# Patient Record
Sex: Female | Born: 1985 | Race: White | Hispanic: No | Smoking: Former smoker
Health system: Southern US, Community
[De-identification: ages and names within clinical notes are randomized; demographics above are authoritative.]

## PROBLEM LIST (undated history)

## (undated) DIAGNOSIS — O1490 Unspecified pre-eclampsia, unspecified trimester: Secondary | ICD-10-CM

## (undated) DIAGNOSIS — F419 Anxiety disorder, unspecified: Secondary | ICD-10-CM

## (undated) DIAGNOSIS — Z8669 Personal history of other diseases of the nervous system and sense organs: Secondary | ICD-10-CM

## (undated) DIAGNOSIS — Z87442 Personal history of urinary calculi: Secondary | ICD-10-CM

## (undated) DIAGNOSIS — J45909 Unspecified asthma, uncomplicated: Secondary | ICD-10-CM

## (undated) HISTORY — PX: WISDOM TOOTH EXTRACTION: SHX21

## (undated) HISTORY — PX: URETER SURGERY: SHX823

## (undated) HISTORY — PX: CYSTOSCOPY: SUR368

---
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
## 2014-03-16 DIAGNOSIS — O09299 Supervision of pregnancy with other poor reproductive or obstetric history, unspecified trimester: Secondary | ICD-10-CM | POA: Insufficient documentation

## 2017-09-10 DIAGNOSIS — O0943 Supervision of pregnancy with grand multiparity, third trimester: Secondary | ICD-10-CM | POA: Insufficient documentation

## 2020-11-28 ENCOUNTER — Emergency Department (HOSPITAL_COMMUNITY): Payer: Medicaid Other

## 2020-11-28 ENCOUNTER — Encounter (HOSPITAL_COMMUNITY): Payer: Self-pay | Admitting: Emergency Medicine

## 2020-11-28 ENCOUNTER — Observation Stay (HOSPITAL_COMMUNITY)
Admission: EM | Admit: 2020-11-28 | Discharge: 2020-11-29 | Disposition: A | Discharge status: Home / Self Care | Payer: Medicaid Other | Attending: Urology | Admitting: Urology

## 2020-11-28 DIAGNOSIS — R319 Hematuria, unspecified: Secondary | ICD-10-CM

## 2020-11-28 DIAGNOSIS — N132 Hydronephrosis with renal and ureteral calculous obstruction: Secondary | ICD-10-CM | POA: Diagnosis not present

## 2020-11-28 DIAGNOSIS — Z20822 Contact with and (suspected) exposure to covid-19: Secondary | ICD-10-CM | POA: Diagnosis not present

## 2020-11-28 DIAGNOSIS — N200 Calculus of kidney: Secondary | ICD-10-CM

## 2020-11-28 DIAGNOSIS — N201 Calculus of ureter: Secondary | ICD-10-CM | POA: Diagnosis present

## 2020-11-28 DIAGNOSIS — Z419 Encounter for procedure for purposes other than remedying health state, unspecified: Secondary | ICD-10-CM

## 2020-11-28 DIAGNOSIS — N390 Urinary tract infection, site not specified: Secondary | ICD-10-CM | POA: Insufficient documentation

## 2020-11-28 DIAGNOSIS — R109 Unspecified abdominal pain: Secondary | ICD-10-CM | POA: Diagnosis present

## 2020-11-28 LAB — COMPREHENSIVE METABOLIC PANEL
ALT: 16 U/L (ref 0–44)
AST: 21 U/L (ref 15–41)
Albumin: 4.1 g/dL (ref 3.5–5.0)
Alkaline Phosphatase: 59 U/L (ref 38–126)
Anion gap: 7 (ref 5–15)
BUN: 9 mg/dL (ref 6–20)
CO2: 22 mmol/L (ref 22–32)
Calcium: 9.1 mg/dL (ref 8.9–10.3)
Chloride: 107 mmol/L (ref 98–111)
Creatinine, Ser: 0.97 mg/dL (ref 0.44–1.00)
GFR, Estimated: >60 mL/min (ref >60)
Glucose, Bld: 124 mg/dL — ABNORMAL HIGH (ref 70–99)
Potassium: 3.5 mmol/L (ref 3.5–5.1)
Sodium: 136 mmol/L (ref 135–145)
Total Bilirubin: 0.7 mg/dL (ref 0.3–1.2)
Total Protein: 6.8 g/dL (ref 6.5–8.1)

## 2020-11-28 LAB — CBC WITH DIFFERENTIAL/PLATELET
Abs Immature Granulocytes: 0.04 10*3/uL (ref 0.00–0.07)
Basophils Absolute: 0.1 10*3/uL (ref 0.0–0.1)
Basophils Relative: 1 %
Eosinophils Absolute: 0.3 10*3/uL (ref 0.0–0.5)
Eosinophils Relative: 3 %
HCT: 42.6 % (ref 36.0–46.0)
Hemoglobin: 14 g/dL (ref 12.0–15.0)
Immature Granulocytes: 0 %
Lymphocytes Relative: 37 %
Lymphs Abs: 3.9 10*3/uL (ref 0.7–4.0)
MCH: 31.7 pg (ref 26.0–34.0)
MCHC: 32.9 g/dL (ref 30.0–36.0)
MCV: 96.4 fL (ref 80.0–100.0)
Monocytes Absolute: 1.1 10*3/uL — ABNORMAL HIGH (ref 0.1–1.0)
Monocytes Relative: 10 %
Neutro Abs: 5.3 10*3/uL (ref 1.7–7.7)
Neutrophils Relative %: 49 %
Platelets: 195 10*3/uL (ref 150–400)
RBC: 4.42 MIL/uL (ref 3.87–5.11)
RDW: 12.2 % (ref 11.5–15.5)
WBC: 10.7 10*3/uL — ABNORMAL HIGH (ref 4.0–10.5)
nRBC: 0 % (ref 0.0–0.2)

## 2020-11-28 LAB — URINALYSIS, ROUTINE W REFLEX MICROSCOPIC
Bilirubin Urine: NEGATIVE
Glucose, UA: NEGATIVE mg/dL
Ketones, ur: NEGATIVE mg/dL
Nitrite: POSITIVE — AB
Protein, ur: NEGATIVE mg/dL
Specific Gravity, Urine: 1.017 (ref 1.005–1.030)
WBC, UA: >50 WBC/hpf — ABNORMAL HIGH (ref 0–5)
pH: 5 (ref 5.0–8.0)

## 2020-11-28 LAB — LIPASE, BLOOD: Lipase: 29 U/L (ref 11–51)

## 2020-11-28 LAB — I-STAT BETA HCG BLOOD, ED (MC, WL, AP ONLY): I-stat hCG, quantitative: <5 m[IU]/mL (ref <5)

## 2020-11-28 MED ORDER — SODIUM CHLORIDE 0.9 % IV SOLN
1.0000 g | Freq: Once | INTRAVENOUS | Status: AC
  Administered 2020-11-28: 1 g via INTRAVENOUS
  Filled 2020-11-28: qty 10

## 2020-11-28 MED ORDER — ONDANSETRON HCL 4 MG/2ML IJ SOLN
4.0000 mg | Freq: Once | INTRAMUSCULAR | Status: AC
  Administered 2020-11-28: 4 mg via INTRAVENOUS
  Filled 2020-11-28: qty 2

## 2020-11-28 MED ORDER — ONDANSETRON 4 MG PO TBDP
4.0000 mg | ORAL_TABLET | Freq: Once | ORAL | Status: AC
  Administered 2020-11-28: 4 mg via ORAL
  Filled 2020-11-28: qty 1

## 2020-11-28 MED ORDER — KETOROLAC TROMETHAMINE 15 MG/ML IJ SOLN
15.0000 mg | Freq: Once | INTRAMUSCULAR | Status: AC
  Administered 2020-11-28: 15 mg via INTRAVENOUS
  Filled 2020-11-28: qty 1

## 2020-11-28 MED ORDER — MORPHINE SULFATE (PF) 4 MG/ML IV SOLN
4.0000 mg | Freq: Once | INTRAVENOUS | Status: AC
  Administered 2020-11-28: 4 mg via INTRAVENOUS
  Filled 2020-11-28: qty 1

## 2020-11-28 MED ORDER — OXYCODONE-ACETAMINOPHEN 5-325 MG PO TABS
1.0000 | ORAL_TABLET | Freq: Once | ORAL | Status: AC
  Administered 2020-11-28: 1 via ORAL
  Filled 2020-11-28: qty 1

## 2020-11-28 NOTE — ED Provider Notes (Signed)
Emergency Medicine Provider Triage Evaluation Note  Laurie Casey , a 35 y.o. female  was evaluated in triage.  Pt complains of kidney stones x 1 week, difficulty voiding. History of frequent kidney infections. Right flank pain, radiates into ribs and down leg.   Review of Systems  Positive: Flank pain, nausea  Negative: fever  Physical Exam  There were no vitals taken for this visit. Gen:   Awake, no distress   Resp:  Normal effort  MSK:   Moves extremities without difficulty  Other:    Medical Decision Making  Medically screening exam initiated at 1:59 PM.  Appropriate orders placed.  HENNESY SOBALVARRO was informed that the remainder of the evaluation will be completed by another provider, this initial triage assessment does not replace that evaluation, and the importance of remaining in the ED until their evaluation is complete.     Jeannie Fend, PA-C 11/28/20 1404    Tilden Fossa, MD 11/29/20 [id number]

## 2020-11-28 NOTE — ED Provider Notes (Signed)
[facility] EMERGENCY DEPARTMENT Provider Note   CSN: [id number] Arrival date & time: 11/28/20  1356     History No chief complaint on file.   Laurie Casey is a 35 y.o. female.  Patient presents ER chief complaint of right flank pain.  She states has been having pain there for the past 4 to 5 days intermittently at a milder level, it became acutely worse yesterday.  Describes it as sharp radiating down the right flank to her right leg.  Denies any fevers or cough or vomiting or diarrhea.  She states it feels similar to kidney stone she has had in the past.        History reviewed. No pertinent past medical history.  There are no problems to display for this patient.   History reviewed. No pertinent surgical history.   OB History   No obstetric history on file.     History reviewed. No pertinent family history.     Home Medications Prior to Admission medications   Not on File    Allergies    Patient has no known allergies.  Review of Systems   Review of Systems  Constitutional: Negative for fever.  HENT: Negative for ear pain.   Eyes: Negative for pain.  Respiratory: Negative for cough.   Cardiovascular: Negative for chest pain.  Gastrointestinal: Negative for abdominal pain.  Genitourinary: Positive for flank pain.  Musculoskeletal: Negative for back pain.  Skin: Negative for rash.  Neurological: Negative for headaches.    Physical Exam Updated Vital Signs BP 138/89 (BP Location: Right Arm)   Pulse 82   Temp 98 F (36.7 C) (Oral)   Resp 18   SpO2 100%   Physical Exam Constitutional:      General: She is not in acute distress.    Appearance: Normal appearance.  HENT:     Head: Normocephalic.     Nose: Nose normal.  Eyes:     Extraocular Movements: Extraocular movements intact.  Cardiovascular:     Rate and Rhythm: Normal rate.  Pulmonary:     Effort: Pulmonary effort is normal.  Abdominal:     Tenderness: There is  right CVA tenderness.  Musculoskeletal:        General: Normal range of motion.     Cervical back: Normal range of motion.  Neurological:     General: No focal deficit present.     Mental Status: She is alert. Mental status is at baseline.     ED Results / Procedures / Treatments   Labs (all labs ordered are listed, but only abnormal results are displayed) Labs Reviewed  CBC WITH DIFFERENTIAL/PLATELET - Abnormal; Notable for the following components:      Result Value   WBC 10.7 (*)    Monocytes Absolute 1.1 (*)    All other components within normal limits  COMPREHENSIVE METABOLIC PANEL - Abnormal; Notable for the following components:   Glucose, Bld 124 (*)    All other components within normal limits  URINALYSIS, ROUTINE W REFLEX MICROSCOPIC - Abnormal; Notable for the following components:   Color, Urine AMBER (*)    APPearance CLOUDY (*)    Hgb urine dipstick MODERATE (*)    Nitrite POSITIVE (*)    Leukocytes,Ua LARGE (*)    WBC, UA >50 (*)    Bacteria, UA MANY (*)    All other components within normal limits  URINE CULTURE  LIPASE, BLOOD  I-STAT BETA HCG BLOOD, ED ([facility], [facility], [facility]  ONLY)    EKG None  Radiology CT Renal Stone Study  Result Date: 11/28/2020 CLINICAL DATA:  Right flank pain. EXAM: CT ABDOMEN AND PELVIS WITHOUT CONTRAST TECHNIQUE: Multidetector CT imaging of the abdomen and pelvis was performed following the standard protocol without IV contrast. COMPARISON:  None. FINDINGS: Lower chest: No acute abnormality. Hepatobiliary: No focal liver abnormality is seen. No gallstones, gallbladder wall thickening, or biliary dilatation. Pancreas: Unremarkable. No pancreatic ductal dilatation or surrounding inflammatory changes. Spleen: Normal in size without focal abnormality. Adrenals/Urinary Tract: Adrenal glands are unremarkable. Kidneys are normal in size, without focal lesions. A 6 mm obstructing renal stone is seen within the proximal right ureter with marked severity  right-sided hydronephrosis and hydroureter. Multiple subcentimeter nonobstructing renal stones are seen along the periphery of a dilated right upper pole renal calyx. Bladder is unremarkable. Stomach/Bowel: Stomach is within normal limits. Appendix appears normal. No evidence of bowel wall thickening, distention, or inflammatory changes. Vascular/Lymphatic: No significant vascular findings are present. No enlarged abdominal or pelvic lymph nodes. Reproductive: An IUD is seen within an otherwise normal appearing uterus. The bilateral adnexa are unremarkable. Other: A 2.2 cm x 1.9 cm fat-containing umbilical hernia is noted. No abdominopelvic ascites. Musculoskeletal: No acute or significant osseous findings. IMPRESSION: 6 mm obstructing renal stone within the proximal right ureter. Electronically Signed   By: Aram Candela M.D.   On: 11/28/2020 17:05    Procedures Procedures   Medications Ordered in ED Medications  cefTRIAXone (ROCEPHIN) 1 g in sodium chloride 0.9 % 100 mL IVPB (1 g Intravenous New Bag/Given 11/28/20 1845)  oxyCODONE-acetaminophen (PERCOCET/ROXICET) 5-325 MG per tablet 1 tablet (1 tablet Oral Given 11/28/20 1436)  ondansetron (ZOFRAN-ODT) disintegrating tablet 4 mg (4 mg Oral Given 11/28/20 1436)  ketorolac (TORADOL) 15 MG/ML injection 15 mg (15 mg Intravenous Given 11/28/20 1655)  ketorolac (TORADOL) 15 MG/ML injection 15 mg (15 mg Intravenous Given 11/28/20 1856)    ED Course  I have reviewed the triage vital signs and the nursing notes.  Pertinent labs & imaging results that were available during my care of the patient were reviewed by me and considered in my medical decision making (see chart for details).    MDM Rules/Calculators/A&P                          Patient presents with kidney stone on CT scan 6 mm with obstructing hydro.  Is positive for UTI.  She describes no fevers or chills however.  Case discussed with urology who will come and see the patient for  possible stenting.   Final Clinical Impression(s) / ED Diagnoses Final diagnoses:  Kidney stone  Urinary tract infection with hematuria, site unspecified    Rx / DC Orders ED Discharge Orders    None       Cheryll Cockayne, MD 11/28/20 1901

## 2020-11-28 NOTE — ED Triage Notes (Signed)
Pt here with c/o right side flank pain , pt has history of kindney stones pain ongoing for 1 week

## 2020-11-28 NOTE — Consult Note (Signed)
Urology Consult   Physician requesting consult: Norman Clay, MD  Reason for consult: Kidney stone with infection  History of Present Illness: Laurie Casey is a 35 y.o. female with long history of recurrent urolithiasis, presenting to the emergency room today with significant right flank pain.  She has had intermittent flank pain for well over a month, but this is worsened over the past few days.  Upon presentation, she was found, on CT scan to have a large right upper ureteral stone.  She also had infected appearing urine.  She has not had a fever or chills.  She denies significant lower urinary tract symptoms.  History is significant for reflux as a child.  She had periureteral injections of a bulking agent at first, that did not work and she ended up having ureteroneocystostomy.  She has needed intervention for 3 prior stones but she states that she has passed over 100.  Her last stone intervention was approximately 10 years ago.   History reviewed. No pertinent past medical history.  History reviewed. No pertinent surgical history.   Current Hospital Medications: Scheduled Meds: Continuous Infusions: PRN Meds:.  Allergies: No Known Allergies  History reviewed. No pertinent family history.  Social History:  has no history on file for tobacco use, alcohol use, and drug use.  ROS: A complete review of systems was performed.  All systems are negative except for pertinent findings as noted.  Physical Exam:  Vital signs in last 24 hours: Temp:  [98 F (36.7 C)] 98 F (36.7 C) (05/18 1401) Pulse Rate:  [82-87] 82 (05/18 1844) Resp:  [17-18] 18 (05/18 1844) BP: (138-140)/(89-91) 138/89 (05/18 1844) SpO2:  [100 %] 100 % (05/18 1844) General:  Alert and oriented, No acute distress HEENT: Normocephalic, atraumatic Neck: No JVD or lymphadenopathy Cardiovascular: Regular rate  Lungs: Normal inspiratory/expiratory excursion Abdomen: Soft, nontender, nondistended, no abdominal  masses Back: Right CVA tenderness Extremities: No edema Neurologic: Grossly intact  Laboratory Data:  Recent Labs    11/28/20 1422  WBC 10.7*  HGB 14.0  HCT 42.6  PLT 195    Recent Labs    11/28/20 1422  NA 136  K 3.5  CL 107  GLUCOSE 124*  BUN 9  CALCIUM 9.1  CREATININE 0.97     Results for orders placed or performed during the hospital encounter of 11/28/20 (from the past 24 hour(s))  CBC with Differential     Status: Abnormal   Collection Time: 11/28/20  2:22 PM  Result Value Ref Range   WBC 10.7 (H) 4.0 - 10.5 K/uL   RBC 4.42 3.87 - 5.11 MIL/uL   Hemoglobin 14.0 12.0 - 15.0 g/dL   HCT 32.6 71.2 - 45.8 %   MCV 96.4 80.0 - 100.0 fL   MCH 31.7 26.0 - 34.0 pg   MCHC 32.9 30.0 - 36.0 g/dL   RDW 09.9 83.3 - 82.5 %   Platelets 195 150 - 400 K/uL   nRBC 0.0 0.0 - 0.2 %   Neutrophils Relative % 49 %   Neutro Abs 5.3 1.7 - 7.7 K/uL   Lymphocytes Relative 37 %   Lymphs Abs 3.9 0.7 - 4.0 K/uL   Monocytes Relative 10 %   Monocytes Absolute 1.1 (H) 0.1 - 1.0 K/uL   Eosinophils Relative 3 %   Eosinophils Absolute 0.3 0.0 - 0.5 K/uL   Basophils Relative 1 %   Basophils Absolute 0.1 0.0 - 0.1 K/uL   Immature Granulocytes 0 %   Abs Immature  Granulocytes 0.04 0.00 - 0.07 K/uL  Comprehensive metabolic panel     Status: Abnormal   Collection Time: 11/28/20  2:22 PM  Result Value Ref Range   Sodium 136 135 - 145 mmol/L   Potassium 3.5 3.5 - 5.1 mmol/L   Chloride 107 98 - 111 mmol/L   CO2 22 22 - 32 mmol/L   Glucose, Bld 124 (H) 70 - 99 mg/dL   BUN 9 6 - 20 mg/dL   Creatinine, Ser 3.33 0.44 - 1.00 mg/dL   Calcium 9.1 8.9 - 54.5 mg/dL   Total Protein 6.8 6.5 - 8.1 g/dL   Albumin 4.1 3.5 - 5.0 g/dL   AST 21 15 - 41 U/L   ALT 16 0 - 44 U/L   Alkaline Phosphatase 59 38 - 126 U/L   Total Bilirubin 0.7 0.3 - 1.2 mg/dL   GFR, Estimated >62 >56 mL/min   Anion gap 7 5 - 15  Lipase, blood     Status: None   Collection Time: 11/28/20  2:22 PM  Result Value Ref Range    Lipase 29 11 - 51 U/L  I-Stat beta hCG blood, ED     Status: None   Collection Time: 11/28/20  3:23 PM  Result Value Ref Range   I-stat hCG, quantitative <5.0 <5 mIU/mL   Comment 3          Urinalysis, Routine w reflex microscopic Urine, Clean Catch     Status: Abnormal   Collection Time: 11/28/20  6:00 PM  Result Value Ref Range   Color, Urine AMBER (A) YELLOW   APPearance CLOUDY (A) CLEAR   Specific Gravity, Urine 1.017 1.005 - 1.030   pH 5.0 5.0 - 8.0   Glucose, UA NEGATIVE NEGATIVE mg/dL   Hgb urine dipstick MODERATE (A) NEGATIVE   Bilirubin Urine NEGATIVE NEGATIVE   Ketones, ur NEGATIVE NEGATIVE mg/dL   Protein, ur NEGATIVE NEGATIVE mg/dL   Nitrite POSITIVE (A) NEGATIVE   Leukocytes,Ua LARGE (A) NEGATIVE   RBC / HPF 21-50 0 - 5 RBC/hpf   WBC, UA >50 (H) 0 - 5 WBC/hpf   Bacteria, UA MANY (A) NONE SEEN   Squamous Epithelial / LPF 0-5 0 - 5   WBC Clumps PRESENT    Mucus PRESENT    No results found for this or any previous visit (from the past 240 hour(s)).  Renal Function: Recent Labs    11/28/20 1422  CREATININE 0.97   CrCl cannot be calculated (Unknown ideal weight.).  Radiologic Imaging: CT Renal Stone Study  Result Date: 11/28/2020 CLINICAL DATA:  Right flank pain. EXAM: CT ABDOMEN AND PELVIS WITHOUT CONTRAST TECHNIQUE: Multidetector CT imaging of the abdomen and pelvis was performed following the standard protocol without IV contrast. COMPARISON:  None. FINDINGS: Lower chest: No acute abnormality. Hepatobiliary: No focal liver abnormality is seen. No gallstones, gallbladder wall thickening, or biliary dilatation. Pancreas: Unremarkable. No pancreatic ductal dilatation or surrounding inflammatory changes. Spleen: Normal in size without focal abnormality. Adrenals/Urinary Tract: Adrenal glands are unremarkable. Kidneys are normal in size, without focal lesions. A 6 mm obstructing renal stone is seen within the proximal right ureter with marked severity right-sided  hydronephrosis and hydroureter. Multiple subcentimeter nonobstructing renal stones are seen along the periphery of a dilated right upper pole renal calyx. Bladder is unremarkable. Stomach/Bowel: Stomach is within normal limits. Appendix appears normal. No evidence of bowel wall thickening, distention, or inflammatory changes. Vascular/Lymphatic: No significant vascular findings are present. No enlarged abdominal or pelvic lymph  nodes. Reproductive: An IUD is seen within an otherwise normal appearing uterus. The bilateral adnexa are unremarkable. Other: A 2.2 cm x 1.9 cm fat-containing umbilical hernia is noted. No abdominopelvic ascites. Musculoskeletal: No acute or significant osseous findings. IMPRESSION: 6 mm obstructing renal stone within the proximal right ureter. Electronically Signed   By: Aram Candela M.D.   On: 11/28/2020 17:05    I independently reviewed the above imaging studies.  Impression/Assessment:  6 mm right upper ureteral calculus with hydronephrosis, urinary tract infection  Plan:  I have discussed management with the patient.  As she does have this large upper ureteral stone as well as infected appearing urine, I recommended urgent stent placement with eventual outpatient management with either ureteroscopy or shockwave lithotripsy.  She agrees to proceed.  I discussed stent placement with her, and the fact that she may have urinary frequency and urgency postoperatively.  She desires to proceed.

## 2020-11-28 NOTE — Anesthesia Preprocedure Evaluation (Addendum)
Anesthesia Evaluation  Patient identified by MRN, date of birth, ID band Patient awake    Reviewed: Allergy & Precautions, NPO status , Patient's Chart, lab work & pertinent test results  History of Anesthesia Complications Negative for: history of anesthetic complications  Airway Mallampati: II  TM Distance: >3 FB Neck ROM: Full    Dental  (+) Dental Advisory Given, Chipped,    Pulmonary neg pulmonary ROS,    Pulmonary exam normal        Cardiovascular negative cardio ROS Normal cardiovascular exam     Neuro/Psych negative neurological ROS  negative psych ROS   GI/Hepatic negative GI ROS, Neg liver ROS,   Endo/Other  negative endocrine ROS  Renal/GU negative Renal ROS     Musculoskeletal negative musculoskeletal ROS (+)   Abdominal   Peds  Hematology negative hematology ROS (+)   Anesthesia Other Findings Covid test negative   Reproductive/Obstetrics  Hx Pre-Eclampsia with previous pregnancy                             Anesthesia Physical Anesthesia Plan  ASA: I  Anesthesia Plan: General   Post-op Pain Management:    Induction: Intravenous  PONV Risk Score and Plan: 3 and Treatment may vary due to age or medical condition, Ondansetron, Dexamethasone and Midazolam  Airway Management Planned: LMA  Additional Equipment: None  Intra-op Plan:   Post-operative Plan: Extubation in OR  Informed Consent: I have reviewed the patients History and Physical, chart, labs and discussed the procedure including the risks, benefits and alternatives for the proposed anesthesia with the patient or authorized representative who has indicated his/her understanding and acceptance.     Dental advisory given  Plan Discussed with: CRNA and Anesthesiologist  Anesthesia Plan Comments:        Anesthesia Quick Evaluation

## 2020-11-29 ENCOUNTER — Encounter (HOSPITAL_COMMUNITY)
Admission: EM | Disposition: A | Discharge status: Home / Self Care | Payer: Self-pay | Source: Home / Self Care | Attending: Emergency Medicine

## 2020-11-29 ENCOUNTER — Emergency Department (HOSPITAL_COMMUNITY): Payer: Medicaid Other | Admitting: Anesthesiology

## 2020-11-29 ENCOUNTER — Emergency Department (HOSPITAL_COMMUNITY): Payer: Medicaid Other

## 2020-11-29 DIAGNOSIS — N201 Calculus of ureter: Secondary | ICD-10-CM | POA: Diagnosis present

## 2020-11-29 DIAGNOSIS — N390 Urinary tract infection, site not specified: Secondary | ICD-10-CM | POA: Diagnosis not present

## 2020-11-29 DIAGNOSIS — N132 Hydronephrosis with renal and ureteral calculous obstruction: Secondary | ICD-10-CM | POA: Diagnosis not present

## 2020-11-29 DIAGNOSIS — Z20822 Contact with and (suspected) exposure to covid-19: Secondary | ICD-10-CM | POA: Diagnosis not present

## 2020-11-29 HISTORY — PX: CYSTOSCOPY W/ URETERAL STENT PLACEMENT: SHX1429

## 2020-11-29 HISTORY — DX: Calculus of ureter: N20.1

## 2020-11-29 LAB — RESP PANEL BY RT-PCR (FLU A&B, COVID) ARPGX2
Influenza A by PCR: NEGATIVE
Influenza B by PCR: NEGATIVE
SARS Coronavirus 2 by RT PCR: NEGATIVE

## 2020-11-29 LAB — HIV ANTIBODY (ROUTINE TESTING W REFLEX): HIV Screen 4th Generation wRfx: NONREACTIVE

## 2020-11-29 SURGERY — CYSTOSCOPY, WITH RETROGRADE PYELOGRAM AND URETERAL STENT INSERTION
Anesthesia: General | Site: Ureter | Laterality: Right

## 2020-11-29 MED ORDER — ACETAMINOPHEN 325 MG PO TABS
650.0000 mg | ORAL_TABLET | ORAL | Status: DC | PRN
  Administered 2020-11-29: 650 mg via ORAL
  Filled 2020-11-29: qty 2

## 2020-11-29 MED ORDER — FENTANYL CITRATE (PF) 100 MCG/2ML IJ SOLN
25.0000 ug | INTRAMUSCULAR | Status: DC | PRN
  Administered 2020-11-29 (×2): 50 ug via INTRAVENOUS

## 2020-11-29 MED ORDER — FENTANYL CITRATE (PF) 250 MCG/5ML IJ SOLN
INTRAMUSCULAR | Status: DC | PRN
  Administered 2020-11-29: 50 ug via INTRAVENOUS

## 2020-11-29 MED ORDER — LIDOCAINE 2% (20 MG/ML) 5 ML SYRINGE
INTRAMUSCULAR | Status: DC | PRN
  Administered 2020-11-29: 60 mg via INTRAVENOUS

## 2020-11-29 MED ORDER — WATER FOR IRRIGATION, STERILE IR SOLN
Status: DC | PRN
  Administered 2020-11-29: 3000 mL via INTRAVESICAL

## 2020-11-29 MED ORDER — ENOXAPARIN SODIUM 30 MG/0.3ML IJ SOSY: 30.0000 mg | PREFILLED_SYRINGE | Freq: Two times a day (BID) | INTRAMUSCULAR | Status: DC

## 2020-11-29 MED ORDER — FENTANYL CITRATE (PF) 100 MCG/2ML IJ SOLN
INTRAMUSCULAR | Status: AC
  Filled 2020-11-29: qty 2

## 2020-11-29 MED ORDER — OXYCODONE HCL 5 MG PO TABS
5.0000 mg | ORAL_TABLET | ORAL | Status: DC | PRN
  Administered 2020-11-29 (×2): 5 mg via ORAL
  Filled 2020-11-29 (×2): qty 1

## 2020-11-29 MED ORDER — SODIUM CHLORIDE 0.45 % IV SOLN: INTRAVENOUS | Status: DC

## 2020-11-29 MED ORDER — DEXAMETHASONE SODIUM PHOSPHATE 10 MG/ML IJ SOLN
INTRAMUSCULAR | Status: DC | PRN
  Administered 2020-11-29: 10 mg via INTRAVENOUS

## 2020-11-29 MED ORDER — SULFAMETHOXAZOLE-TRIMETHOPRIM 800-160 MG PO TABS
1.0000 | ORAL_TABLET | Freq: Two times a day (BID) | ORAL | Status: DC
  Administered 2020-11-29: 1 via ORAL
  Filled 2020-11-29: qty 1

## 2020-11-29 MED ORDER — PROMETHAZINE HCL 25 MG/ML IJ SOLN: 6.2500 mg | INTRAMUSCULAR | Status: DC | PRN

## 2020-11-29 MED ORDER — MIDAZOLAM HCL 2 MG/2ML IJ SOLN
INTRAMUSCULAR | Status: AC
  Filled 2020-11-29: qty 2

## 2020-11-29 MED ORDER — FENTANYL CITRATE (PF) 250 MCG/5ML IJ SOLN
INTRAMUSCULAR | Status: AC
  Filled 2020-11-29: qty 5

## 2020-11-29 MED ORDER — PROPOFOL 10 MG/ML IV BOLUS
INTRAVENOUS | Status: AC
  Filled 2020-11-29: qty 20

## 2020-11-29 MED ORDER — MIDAZOLAM HCL 2 MG/2ML IJ SOLN
INTRAMUSCULAR | Status: DC | PRN
  Administered 2020-11-29: 2 mg via INTRAVENOUS

## 2020-11-29 MED ORDER — PROPOFOL 10 MG/ML IV BOLUS
INTRAVENOUS | Status: DC | PRN
  Administered 2020-11-29: 200 mg via INTRAVENOUS

## 2020-11-29 MED ORDER — OXYCODONE HCL 5 MG/5ML PO SOLN
5.0000 mg | Freq: Once | ORAL | Status: DC | PRN
Start: 2020-11-29 — End: 2020-11-29

## 2020-11-29 MED ORDER — OXYBUTYNIN CHLORIDE 5 MG PO TABS: 5.0000 mg | ORAL_TABLET | Freq: Three times a day (TID) | ORAL | 1 refills | Status: DC | PRN

## 2020-11-29 MED ORDER — 0.9 % SODIUM CHLORIDE (POUR BTL) OPTIME
TOPICAL | Status: DC | PRN
  Administered 2020-11-29: 1000 mL

## 2020-11-29 MED ORDER — IOHEXOL 300 MG/ML  SOLN
INTRAMUSCULAR | Status: DC | PRN
  Administered 2020-11-29: 13 mL via URETHRAL

## 2020-11-29 MED ORDER — LACTATED RINGERS IV SOLN: INTRAVENOUS | Status: DC | PRN

## 2020-11-29 MED ORDER — SULFAMETHOXAZOLE-TRIMETHOPRIM 800-160 MG PO TABS: 1.0000 | ORAL_TABLET | Freq: Two times a day (BID) | ORAL | 0 refills | Status: DC

## 2020-11-29 MED ORDER — ONDANSETRON HCL 4 MG/2ML IJ SOLN
INTRAMUSCULAR | Status: DC | PRN
  Administered 2020-11-29: 4 mg via INTRAVENOUS

## 2020-11-29 MED ORDER — OXYCODONE HCL 5 MG PO TABS: 5.0000 mg | ORAL_TABLET | Freq: Once | ORAL | Status: DC | PRN

## 2020-11-29 MED ORDER — SENNA 8.6 MG PO TABS
1.0000 | ORAL_TABLET | Freq: Two times a day (BID) | ORAL | Status: DC
  Administered 2020-11-29: 8.6 mg via ORAL
  Filled 2020-11-29: qty 1

## 2020-11-29 SURGICAL SUPPLY — 23 items
BAG DRN RND TRDRP ANRFLXCHMBR (UROLOGICAL SUPPLIES) ×1
BAG URINE DRAIN 2000ML AR STRL (UROLOGICAL SUPPLIES) ×2 IMPLANT
BAG URO CATCHER STRL LF (MISCELLANEOUS) ×2 IMPLANT
CATH FOLEY 2WAY SLVR  5CC 16FR (CATHETERS)
CATH FOLEY 2WAY SLVR 5CC 16FR (CATHETERS) IMPLANT
CATH INTERMIT  6FR 70CM (CATHETERS) ×2 IMPLANT
GLOVE BIOGEL M STRL SZ7.5 (GLOVE) ×2 IMPLANT
GOWN STRL REUS W/ TWL LRG LVL3 (GOWN DISPOSABLE) ×1 IMPLANT
GOWN STRL REUS W/ TWL XL LVL3 (GOWN DISPOSABLE) ×1 IMPLANT
GOWN STRL REUS W/TWL LRG LVL3 (GOWN DISPOSABLE) ×2
GOWN STRL REUS W/TWL XL LVL3 (GOWN DISPOSABLE) ×2
GUIDEWIRE ANG ZIPWIRE 038X150 (WIRE) ×1 IMPLANT
GUIDEWIRE STR DUAL SENSOR (WIRE) ×2 IMPLANT
KIT TURNOVER KIT B (KITS) ×2 IMPLANT
MANIFOLD NEPTUNE II (INSTRUMENTS) ×1 IMPLANT
NS IRRIG 1000ML POUR BTL (IV SOLUTION) ×1 IMPLANT
PACK CYSTO (CUSTOM PROCEDURE TRAY) ×2 IMPLANT
STENT URET 6FRX24 CONTOUR (STENTS) ×1 IMPLANT
STENT URET 6FRX26 CONTOUR (STENTS) IMPLANT
SYPHON OMNI JUG (MISCELLANEOUS) ×2 IMPLANT
TOWEL GREEN STERILE FF (TOWEL DISPOSABLE) ×2 IMPLANT
TUBE CONNECTING 12X1/4 (SUCTIONS) ×1 IMPLANT
WATER STERILE IRR 3000ML UROMA (IV SOLUTION) ×2 IMPLANT

## 2020-11-29 NOTE — Anesthesia Postprocedure Evaluation (Signed)
Anesthesia Post Note  Patient: Laurie Casey  Procedure(s) Performed: CYSTOSCOPY WITH RETROGRADE PYELOGRAM/URETERAL STENT PLACEMENT (Right Ureter)     Patient location during evaluation: PACU Anesthesia Type: General Level of consciousness: awake and alert Pain management: pain level controlled Vital Signs Assessment: post-procedure vital signs reviewed and stable Respiratory status: spontaneous breathing, nonlabored ventilation and respiratory function stable Cardiovascular status: stable and blood pressure returned to baseline Anesthetic complications: no   No complications documented.  Last Vitals:  Vitals:   11/29/20 0255 11/29/20 0316  BP: 117/77 109/82  Pulse: 99 89  Resp: 10 17  Temp: 36.6 C 36.9 C  SpO2: 99% 100%    Last Pain:  Vitals:   11/29/20 0413  TempSrc:   PainSc: Asleep                 Beryle Lathe

## 2020-11-29 NOTE — Discharge Summary (Signed)
Patient ID: Laurie Casey MRN: [id number] DOB/AGE: 1986-05-03 35 y.o.  Admit date: 11/28/2020 Discharge date: 11/29/2020  Primary Care Physician:  Default, Provider, MD  Discharge Diagnoses:  Rt hydronephrosis, Rt ureteral stone, UTI, unspecified    Discharge Medications: Allergies as of 11/29/2020   No Known Allergies     Medication List    TAKE these medications   oxybutynin 5 MG tablet Commonly known as: DITROPAN Take 1 tablet (5 mg total) by mouth every 8 (eight) hours as needed for up to 15 doses for bladder spasms.   sulfamethoxazole-trimethoprim 800-160 MG tablet Commonly known as: BACTRIM DS Take 1 tablet by mouth every 12 (twelve) hours.        Significant Diagnostic Studies:  DG Retrograde Pyelogram  Result Date: 11/29/2020 CLINICAL DATA:  Cystoscopy with retrograde pyelogram and ureteral stent placement. EXAM: RETROGRADE PYELOGRAM COMPARISON:  CT Nov 28, 2020. FINDINGS: Interval placement of a double-J ureteral stent in the right ureter. Intrauterine device visualized overlying the pelvis. IMPRESSION: Interval placement of a right ureteral stent. Electronically Signed   By: Maudry Mayhew MD   On: 11/29/2020 02:47   CT Renal Stone Study  Result Date: 11/28/2020 CLINICAL DATA:  Right flank pain. EXAM: CT ABDOMEN AND PELVIS WITHOUT CONTRAST TECHNIQUE: Multidetector CT imaging of the abdomen and pelvis was performed following the standard protocol without IV contrast. COMPARISON:  None. FINDINGS: Lower chest: No acute abnormality. Hepatobiliary: No focal liver abnormality is seen. No gallstones, gallbladder wall thickening, or biliary dilatation. Pancreas: Unremarkable. No pancreatic ductal dilatation or surrounding inflammatory changes. Spleen: Normal in size without focal abnormality. Adrenals/Urinary Tract: Adrenal glands are unremarkable. Kidneys are normal in size, without focal lesions. A 6 mm obstructing renal stone is seen within the proximal right ureter with  marked severity right-sided hydronephrosis and hydroureter. Multiple subcentimeter nonobstructing renal stones are seen along the periphery of a dilated right upper pole renal calyx. Bladder is unremarkable. Stomach/Bowel: Stomach is within normal limits. Appendix appears normal. No evidence of bowel wall thickening, distention, or inflammatory changes. Vascular/Lymphatic: No significant vascular findings are present. No enlarged abdominal or pelvic lymph nodes. Reproductive: An IUD is seen within an otherwise normal appearing uterus. The bilateral adnexa are unremarkable. Other: A 2.2 cm x 1.9 cm fat-containing umbilical hernia is noted. No abdominopelvic ascites. Musculoskeletal: No acute or significant osseous findings. IMPRESSION: 6 mm obstructing renal stone within the proximal right ureter. Electronically Signed   By: Aram Candela M.D.   On: 11/28/2020 17:05    Brief H and P: For complete details please refer to admission H and P, but in brief pt admitted, for urgent mgmt of an obstructing Rt ureteral stone w/ hydro/UTI  Hospital Course: Pt had urgent stent placement. No postop problems Active Problems:   Calculus of ureter   Day of Discharge BP 109/82 (BP Location: Left Arm)   Pulse 89   Temp 98.4 F (36.9 C) (Oral)   Resp 17   Ht 5\' 5"  (1.651 m)   Wt 74.8 kg   SpO2 100%   BMI 27.46 kg/m   Results for orders placed or performed during the hospital encounter of 11/28/20 (from the past 24 hour(s))  CBC with Differential     Status: Abnormal   Collection Time: 11/28/20  2:22 PM  Result Value Ref Range   WBC 10.7 (H) 4.0 - 10.5 K/uL   RBC 4.42 3.87 - 5.11 MIL/uL   Hemoglobin 14.0 12.0 - 15.0 g/dL   HCT 11/30/20 06.2 - 37.6 %  MCV 96.4 80.0 - 100.0 fL   MCH 31.7 26.0 - 34.0 pg   MCHC 32.9 30.0 - 36.0 g/dL   RDW 42.7 06.2 - 37.6 %   Platelets 195 150 - 400 K/uL   nRBC 0.0 0.0 - 0.2 %   Neutrophils Relative % 49 %   Neutro Abs 5.3 1.7 - 7.7 K/uL   Lymphocytes Relative 37 %    Lymphs Abs 3.9 0.7 - 4.0 K/uL   Monocytes Relative 10 %   Monocytes Absolute 1.1 (H) 0.1 - 1.0 K/uL   Eosinophils Relative 3 %   Eosinophils Absolute 0.3 0.0 - 0.5 K/uL   Basophils Relative 1 %   Basophils Absolute 0.1 0.0 - 0.1 K/uL   Immature Granulocytes 0 %   Abs Immature Granulocytes 0.04 0.00 - 0.07 K/uL  Comprehensive metabolic panel     Status: Abnormal   Collection Time: 11/28/20  2:22 PM  Result Value Ref Range   Sodium 136 135 - 145 mmol/L   Potassium 3.5 3.5 - 5.1 mmol/L   Chloride 107 98 - 111 mmol/L   CO2 22 22 - 32 mmol/L   Glucose, Bld 124 (H) 70 - 99 mg/dL   BUN 9 6 - 20 mg/dL   Creatinine, Ser 2.83 0.44 - 1.00 mg/dL   Calcium 9.1 8.9 - 15.1 mg/dL   Total Protein 6.8 6.5 - 8.1 g/dL   Albumin 4.1 3.5 - 5.0 g/dL   AST 21 15 - 41 U/L   ALT 16 0 - 44 U/L   Alkaline Phosphatase 59 38 - 126 U/L   Total Bilirubin 0.7 0.3 - 1.2 mg/dL   GFR, Estimated >76 >16 mL/min   Anion gap 7 5 - 15  Lipase, blood     Status: None   Collection Time: 11/28/20  2:22 PM  Result Value Ref Range   Lipase 29 11 - 51 U/L  I-Stat beta hCG blood, ED     Status: None   Collection Time: 11/28/20  3:23 PM  Result Value Ref Range   I-stat hCG, quantitative <5.0 <5 mIU/mL   Comment 3          Urinalysis, Routine w reflex microscopic Urine, Clean Catch     Status: Abnormal   Collection Time: 11/28/20  6:00 PM  Result Value Ref Range   Color, Urine AMBER (A) YELLOW   APPearance CLOUDY (A) CLEAR   Specific Gravity, Urine 1.017 1.005 - 1.030   pH 5.0 5.0 - 8.0   Glucose, UA NEGATIVE NEGATIVE mg/dL   Hgb urine dipstick MODERATE (A) NEGATIVE   Bilirubin Urine NEGATIVE NEGATIVE   Ketones, ur NEGATIVE NEGATIVE mg/dL   Protein, ur NEGATIVE NEGATIVE mg/dL   Nitrite POSITIVE (A) NEGATIVE   Leukocytes,Ua LARGE (A) NEGATIVE   RBC / HPF 21-50 0 - 5 RBC/hpf   WBC, UA >50 (H) 0 - 5 WBC/hpf   Bacteria, UA MANY (A) NONE SEEN   Squamous Epithelial / LPF 0-5 0 - 5   WBC Clumps PRESENT    Mucus  PRESENT   Resp Panel by RT-PCR (Flu A&B, Covid) Nasopharyngeal Swab     Status: None   Collection Time: 11/28/20  9:38 PM   Specimen: Nasopharyngeal Swab; Nasopharyngeal(NP) swabs in vial transport medium  Result Value Ref Range   SARS Coronavirus 2 by RT PCR NEGATIVE NEGATIVE   Influenza A by PCR NEGATIVE NEGATIVE   Influenza B by PCR NEGATIVE NEGATIVE    Physical Exam: General: Alert and awake  oriented x3 not in any acute distress. HEENT: anicteric sclera, pupils reactive to light and accommodation CVS: S1-S2 clear no murmur rubs or gallops Chest: clear to auscultation bilaterally, no wheezing rales or rhonchi Abdomen: soft nontender, nondistended, normal bowel sounds, no organomegaly Extremities: no cyanosis, clubbing or edema noted bilaterally Neuro: Cranial nerves II-XII intact, no focal neurological deficits  Disposition:  Home  Diet:  Regular  Activity:  No restictions   TESTS THAT NEED FOLLOW-UP   Culture results  DISCHARGE FOLLOW-UP   Follow-up Information    [facility].   Why: we will call you to set up appt Contact information: [address] [place] [address] [phone]              Time spent on Discharge:   15 mins  Signed: Bertram Millard Rodger Giangregorio 11/29/2020, 7:22 AM

## 2020-11-29 NOTE — Transfer of Care (Signed)
Immediate Anesthesia Transfer of Care Note  Patient: Laurie Casey  Procedure(s) Performed: CYSTOSCOPY WITH RETROGRADE PYELOGRAM/URETERAL STENT PLACEMENT (Right Ureter)  Patient Location: PACU  Anesthesia Type:General  Level of Consciousness: awake, alert  and oriented  Airway & Oxygen Therapy: Patient Spontanous Breathing  Post-op Assessment: Report given to RN and Post -op Vital signs reviewed and stable  Post vital signs: Reviewed and stable  Last Vitals:  Vitals Value Taken Time  BP 109/69 11/29/20 0206  Temp    Pulse 108 11/29/20 0207  Resp 15 11/29/20 0207  SpO2 100 % 11/29/20 0207  Vitals shown include unvalidated device data.  Last Pain:  Vitals:   11/29/20 0017  TempSrc: Oral  PainSc:          Complications: No complications documented.

## 2020-11-29 NOTE — Progress Notes (Signed)
Patient arrived to [phone] from PACU. Report received from [place], [place]. Patient alert and oriented x4. Pt c/o 5/10 right flank pain. PRN pain med given. See MAR. Patient call bell within reach. Will continue to monitor.

## 2020-11-29 NOTE — Discharge Instructions (Signed)

## 2020-11-29 NOTE — Anesthesia Procedure Notes (Signed)
Procedure Name: LMA Insertion Date/Time: 11/29/2020 1:32 AM Performed by: Dairl Ponder, CRNA Pre-anesthesia Checklist: Patient identified, Emergency Drugs available, Suction available, Patient being monitored and Timeout performed Patient Re-evaluated:Patient Re-evaluated prior to induction Oxygen Delivery Method: Circle system utilized Preoxygenation: Pre-oxygenation with 100% oxygen Induction Type: IV induction LMA: LMA inserted LMA Size: 3.0 Number of attempts: 1 Placement Confirmation: positive ETCO2 and breath sounds checked- equal and bilateral Tube secured with: Tape Dental Injury: Teeth and Oropharynx as per pre-operative assessment

## 2020-11-29 NOTE — Op Note (Signed)
Preoperative diagnosis: Right proximal ureteral stone with hydronephrosis/urinary tract infection  Postoperative diagnosis: Same  Principal procedure: Cystoscopy, right retrograde ureteropyelogram, fluoroscopic interpretation, placement of 6 French by 24 cm contour double-J stent without tether  Surgeon: Latrecia Capito  Anesthesia: General with LMA  Complications: None  Specimen: None  Estimated blood loss: None  Indications: 35 year old female with history of multiple kidney stones, she states only 3 have needed prior intervention.  She presented to the emergency room today with several days of right flank pain, serious/significant nature.  Additionally, she has infected urine.  She does not seem septic, but with her moderate size right upper ureteral stone in this infection, I have strongly recommended we proceed urgently with stent placement, management of urinary tract infection and eventual outpatient management of her stone.  I discussed the procedure, risks and complications.  She understands and desires to proceed.  Findings: Bladder appeared normal.  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  Retrograde ureteropyelogram revealed a normal ureter throughout except for a filling defect at the proximal ureter with significant hydroureteronephrosis/pyelocaliectasis above it.  Urothelium the bladder was normal.  Description of procedure: The patient was previously marked in the emergency room, she received preoperative IV Rocephin.  She was taken to the operating room where general anesthetic was administered with the LMA.  She was placed in the dorsolithotomy position, genitalia and perineum were prepped, draped, proper timeout performed.  21 French panendoscope was placed in the bladder with systematic inspection carried out.  Retrograde ureteropyelogram was performed using a 6 [place] open-ended  catheter and Omnipaque with the above-mentioned findings.  I negotiated a sensor tip guidewire through the open-ended catheter, easily passed the stone and into the upper pole calyx where a curl was seen.  Open-ended catheter was removed, I then placed a 24 centimeters, 6 French contour double-J stent over top of the guidewire, properly positioned within the ureter.  Once deployed with the guidewire removed, there was a good proximal and distal curl seen with fluoroscopy and cystoscopy, respectively.  The bladder was drained, the procedure then terminated.  The patient was awakened, taken to the PACU in stable condition.  She tolerated the procedure well.

## 2020-11-30 ENCOUNTER — Encounter (HOSPITAL_COMMUNITY): Payer: Self-pay | Admitting: Urology

## 2020-12-01 LAB — URINE CULTURE: Culture: >=100000 — AB

## 2020-12-12 ENCOUNTER — Other Ambulatory Visit: Payer: Self-pay | Admitting: Urology

## 2020-12-12 ENCOUNTER — Encounter (HOSPITAL_COMMUNITY): Payer: Self-pay | Admitting: Urology

## 2020-12-12 ENCOUNTER — Other Ambulatory Visit: Payer: Self-pay

## 2020-12-12 NOTE — Progress Notes (Signed)
COVID Vaccine Completed: No Date COVID Vaccine completed: N/A Has received booster:N/A COVID vaccine manufacturer: N/A Date of COVID positive in last 90 days: No  PCP - Rogene Houston, DNP, FNP [facility] Cardiologist - N/A  Chest x-ray - N/A EKG - N/A Stress Test - N/A ECHO - N/A Cardiac Cath - N/A Pacemaker/ICD device last checked: N/A  Sleep Study - N/A CPAP - N/A  Fasting Blood Sugar - N/A Checks Blood Sugar __N/A___ times a day  Blood Thinner Instructions: N/A Aspirin Instructions: N/A Last Dose:N/A   Activity level:  Able to exercise without symptoms    Anesthesia review: N/A  Patient denies shortness of breath, fever, cough and chest pain at PAT appointment   Patient verbalized understanding of instructions that were given to them at the PAT appointment. Patient was also instructed that they will need to review over the PAT instructions again at home before surgery.

## 2020-12-13 ENCOUNTER — Ambulatory Visit (HOSPITAL_COMMUNITY)
Admission: RE | Admit: 2020-12-13 | Discharge: 2020-12-13 | Disposition: A | Discharge status: Home / Self Care | Payer: Medicaid Other | Attending: Urology | Admitting: Urology

## 2020-12-13 ENCOUNTER — Ambulatory Visit (HOSPITAL_COMMUNITY): Payer: Medicaid Other | Admitting: Certified Registered"

## 2020-12-13 ENCOUNTER — Ambulatory Visit (HOSPITAL_COMMUNITY): Payer: Medicaid Other

## 2020-12-13 ENCOUNTER — Encounter (HOSPITAL_COMMUNITY): Payer: Self-pay | Admitting: Urology

## 2020-12-13 ENCOUNTER — Encounter (HOSPITAL_COMMUNITY)
Admission: RE | Disposition: A | Discharge status: Home / Self Care | Payer: Self-pay | Source: Home / Self Care | Attending: Urology

## 2020-12-13 DIAGNOSIS — Z87891 Personal history of nicotine dependence: Secondary | ICD-10-CM | POA: Insufficient documentation

## 2020-12-13 DIAGNOSIS — Z87442 Personal history of urinary calculi: Secondary | ICD-10-CM | POA: Insufficient documentation

## 2020-12-13 DIAGNOSIS — N201 Calculus of ureter: Secondary | ICD-10-CM | POA: Diagnosis not present

## 2020-12-13 DIAGNOSIS — Z87440 Personal history of urinary (tract) infections: Secondary | ICD-10-CM | POA: Diagnosis not present

## 2020-12-13 DIAGNOSIS — Z888 Allergy status to other drugs, medicaments and biological substances status: Secondary | ICD-10-CM | POA: Diagnosis not present

## 2020-12-13 HISTORY — DX: Personal history of other diseases of the nervous system and sense organs: Z86.69

## 2020-12-13 HISTORY — DX: Personal history of urinary calculi: Z87.442

## 2020-12-13 HISTORY — PX: CYSTOSCOPY/URETEROSCOPY/HOLMIUM LASER/STENT PLACEMENT: SHX6546

## 2020-12-13 HISTORY — DX: Unspecified pre-eclampsia, unspecified trimester: O14.90

## 2020-12-13 HISTORY — DX: Anxiety disorder, unspecified: F41.9

## 2020-12-13 HISTORY — DX: Unspecified asthma, uncomplicated: J45.909

## 2020-12-13 LAB — PREGNANCY, URINE: Preg Test, Ur: NEGATIVE

## 2020-12-13 SURGERY — CYSTOSCOPY/URETEROSCOPY/HOLMIUM LASER/STENT PLACEMENT
Anesthesia: General | Site: Ureter | Laterality: Right

## 2020-12-13 MED ORDER — BELLADONNA ALKALOIDS-OPIUM 16.2-60 MG RE SUPP
RECTAL | Status: AC
  Administered 2020-12-13: 1
  Filled 2020-12-13: qty 1

## 2020-12-13 MED ORDER — FENTANYL CITRATE (PF) 100 MCG/2ML IJ SOLN
INTRAMUSCULAR | Status: AC
  Administered 2020-12-13: 50 ug via INTRAVENOUS
  Filled 2020-12-13: qty 2

## 2020-12-13 MED ORDER — BELLADONNA ALKALOIDS-OPIUM 16.2-30 MG RE SUPP
1.0000 | Freq: Once | RECTAL | Status: AC
Start: 2020-12-13 — End: 2020-12-13
  Administered 2020-12-13: 1 via RECTAL

## 2020-12-13 MED ORDER — CIPROFLOXACIN IN D5W 400 MG/200ML IV SOLN
INTRAVENOUS | Status: DC | PRN
  Administered 2020-12-13: 400 mg via INTRAVENOUS

## 2020-12-13 MED ORDER — CIPROFLOXACIN IN D5W 400 MG/200ML IV SOLN
INTRAVENOUS | Status: AC
  Filled 2020-12-13: qty 200

## 2020-12-13 MED ORDER — DEXAMETHASONE SODIUM PHOSPHATE 10 MG/ML IJ SOLN
INTRAMUSCULAR | Status: DC | PRN
  Administered 2020-12-13: 4 mg via INTRAVENOUS

## 2020-12-13 MED ORDER — ACETAMINOPHEN 500 MG PO TABS
ORAL_TABLET | ORAL | Status: AC
  Filled 2020-12-13: qty 2

## 2020-12-13 MED ORDER — FENTANYL CITRATE (PF) 100 MCG/2ML IJ SOLN
INTRAMUSCULAR | Status: AC
  Filled 2020-12-13: qty 2

## 2020-12-13 MED ORDER — MIDAZOLAM HCL 2 MG/2ML IJ SOLN
INTRAMUSCULAR | Status: AC
  Filled 2020-12-13: qty 2

## 2020-12-13 MED ORDER — MIDAZOLAM HCL 5 MG/5ML IJ SOLN
INTRAMUSCULAR | Status: DC | PRN
  Administered 2020-12-13: 2 mg via INTRAVENOUS

## 2020-12-13 MED ORDER — LIDOCAINE 2% (20 MG/ML) 5 ML SYRINGE
INTRAMUSCULAR | Status: DC | PRN
  Administered 2020-12-13: 100 mg via INTRAVENOUS

## 2020-12-13 MED ORDER — FENTANYL CITRATE (PF) 100 MCG/2ML IJ SOLN
INTRAMUSCULAR | Status: DC | PRN
  Administered 2020-12-13 (×2): 25 ug via INTRAVENOUS

## 2020-12-13 MED ORDER — LACTATED RINGERS IV SOLN: INTRAVENOUS | Status: DC

## 2020-12-13 MED ORDER — ONDANSETRON HCL 4 MG/2ML IJ SOLN
INTRAMUSCULAR | Status: DC | PRN
  Administered 2020-12-13: 4 mg via INTRAVENOUS

## 2020-12-13 MED ORDER — ACETAMINOPHEN 500 MG PO TABS: 1000.0000 mg | ORAL_TABLET | Freq: Once | ORAL | Status: DC

## 2020-12-13 MED ORDER — CHLORHEXIDINE GLUCONATE 0.12 % MT SOLN
15.0000 mL | Freq: Once | OROMUCOSAL | Status: AC
  Administered 2020-12-13: 15 mL via OROMUCOSAL

## 2020-12-13 MED ORDER — ALPRAZOLAM 0.25 MG PO TABS
0.2500 mg | ORAL_TABLET | Freq: Three times a day (TID) | ORAL | Status: AC | PRN
  Administered 2020-12-13: 0.25 mg via ORAL
  Filled 2020-12-13: qty 1

## 2020-12-13 MED ORDER — IOHEXOL 300 MG/ML  SOLN
INTRAMUSCULAR | Status: DC | PRN
  Administered 2020-12-13: 3 mL

## 2020-12-13 MED ORDER — PROPOFOL 10 MG/ML IV BOLUS
INTRAVENOUS | Status: DC | PRN
  Administered 2020-12-13: 200 mg via INTRAVENOUS

## 2020-12-13 MED ORDER — FENTANYL CITRATE (PF) 100 MCG/2ML IJ SOLN
25.0000 ug | INTRAMUSCULAR | Status: DC | PRN
  Administered 2020-12-13: 50 ug via INTRAVENOUS

## 2020-12-13 MED ORDER — PHENAZOPYRIDINE HCL 200 MG PO TABS
ORAL_TABLET | ORAL | Status: AC
  Administered 2020-12-13: 200 mg via ORAL
  Filled 2020-12-13: qty 2

## 2020-12-13 MED ORDER — SODIUM CHLORIDE 0.9 % IR SOLN
Status: DC | PRN
  Administered 2020-12-13: 3000 mL

## 2020-12-13 MED ORDER — PROPOFOL 10 MG/ML IV BOLUS
INTRAVENOUS | Status: AC
  Filled 2020-12-13: qty 20

## 2020-12-13 MED ORDER — PHENAZOPYRIDINE HCL 200 MG PO TABS: 200.0000 mg | ORAL_TABLET | Freq: Three times a day (TID) | ORAL | Status: DC

## 2020-12-13 SURGICAL SUPPLY — 23 items
BAG URO CATCHER STRL LF (MISCELLANEOUS) ×2 IMPLANT
CATH INTERMIT  6FR 70CM (CATHETERS) ×1 IMPLANT
CLOTH BEACON ORANGE TIMEOUT ST (SAFETY) ×2 IMPLANT
EXTRACTOR STONE NITINOL NGAGE (UROLOGICAL SUPPLIES) IMPLANT
GLOVE SURG ENC TEXT LTX SZ8 (GLOVE) ×2 IMPLANT
GOWN STRL REUS W/TWL XL LVL3 (GOWN DISPOSABLE) ×2 IMPLANT
GUIDEWIRE ANG ZIPWIRE 038X150 (WIRE) IMPLANT
GUIDEWIRE STR DUAL SENSOR (WIRE) ×4 IMPLANT
IV NS 1000ML (IV SOLUTION) ×2
IV NS 1000ML BAXH (IV SOLUTION) ×1 IMPLANT
KIT TURNOVER KIT A (KITS) ×2 IMPLANT
LASER FIB FLEXIVA PULSE ID 365 (Laser) ×1 IMPLANT
MANIFOLD NEPTUNE II (INSTRUMENTS) ×2 IMPLANT
PACK CYSTO (CUSTOM PROCEDURE TRAY) ×2 IMPLANT
SHEATH URETERAL 12FRX28CM (UROLOGICAL SUPPLIES) IMPLANT
SHEATH URETERAL 12FRX35CM (MISCELLANEOUS) IMPLANT
SHEATH URETERAL 12FRX55CM (UROLOGICAL SUPPLIES) IMPLANT
STENT URET 6FRX24 CONTOUR (STENTS) ×1 IMPLANT
SYR 10ML LL (SYRINGE) ×1 IMPLANT
TRACTIP FLEXIVA PULS ID 200XHI (Laser) IMPLANT
TRACTIP FLEXIVA PULSE ID 200 (Laser)
TUBING CONNECTING 10 (TUBING) ×2 IMPLANT
TUBING UROLOGY SET (TUBING) ×2 IMPLANT

## 2020-12-13 NOTE — Discharge Instructions (Signed)
1. You may see some blood in the urine and may have some burning with urination for 48-72 hours. You also may notice that you have to urinate more frequently or urgently after your procedure which is normal.  2. You should call should you develop an inability urinate, fever > 101, persistent nausea and vomiting that prevents you from eating or drinking to stay hydrated.  3. I did leave a stent in, as where the stone was in your ureter there was surrounding inflammation.  I think it would be fine to remove the stent on Monday, June 6.  The thread for the stent is tucked inside your vagina.  Just pulling the thread to remove the stent.  There were no sizable fragments that I had to remove.  There will be small particles that you should easily pass over the next few days.

## 2020-12-13 NOTE — Anesthesia Procedure Notes (Signed)
Procedure Name: LMA Insertion Performed by: Ezekiel Ina, CRNA Pre-anesthesia Checklist: Patient identified, Emergency Drugs available, Suction available and Patient being monitored Patient Re-evaluated:Patient Re-evaluated prior to induction Oxygen Delivery Method: Circle System Utilized Preoxygenation: Pre-oxygenation with 100% oxygen Induction Type: IV induction Ventilation: Mask ventilation without difficulty LMA: LMA with gastric port inserted LMA Size: 4.0 Number of attempts: 1 Placement Confirmation: positive ETCO2 Tube secured with: Tape Dental Injury: Teeth and Oropharynx as per pre-operative assessment

## 2020-12-13 NOTE — Anesthesia Postprocedure Evaluation (Signed)
Anesthesia Post Note  Patient: JAZMINE HECKMAN  Procedure(s) Performed: CYSTOSCOPY RIGHT URETEROSCOPY/HOLMIUM LASER/STENT EXTRACTION AND RIGHT JJ STENT PLACEMENT, RIGHT RETROGRADE URETEROSCOPY (Right Ureter)     Patient location during evaluation: PACU Anesthesia Type: General Level of consciousness: awake and alert and oriented Pain management: pain level controlled Vital Signs Assessment: post-procedure vital signs reviewed and stable Respiratory status: spontaneous breathing, nonlabored ventilation and respiratory function stable Cardiovascular status: blood pressure returned to baseline and stable Postop Assessment: no apparent nausea or vomiting Anesthetic complications: no   No complications documented.  Last Vitals:  Vitals:   12/13/20 1215 12/13/20 1230  BP: 140/88 (!) 133/99  Pulse: (!) 101 77  Resp: 14 10  Temp:  36.6 C  SpO2: 97% 98%    Last Pain:  Vitals:   12/13/20 1215  TempSrc:   PainSc: 0-No pain                 Devonta Blanford A.

## 2020-12-13 NOTE — Transfer of Care (Signed)
Immediate Anesthesia Transfer of Care Note  Patient: Laurie Casey  Procedure(s) Performed: CYSTOSCOPY RIGHT URETEROSCOPY/HOLMIUM LASER/STENT EXTRACTION AND RIGHT JJ STENT PLACEMENT, RIGHT RETROGRADE URETEROSCOPY (Right Ureter)  Patient Location: PACU  Anesthesia Type:General  Level of Consciousness: sedated and drowsy  Airway & Oxygen Therapy: Patient Spontanous Breathing and Patient connected to face mask oxygen  Post-op Assessment: Report given to RN and Post -op Vital signs reviewed and unstable, Anesthesiologist notified  Post vital signs: stable  Last Vitals:  Vitals Value Taken Time  BP    Temp    Pulse    Resp    SpO2      Last Pain:  Vitals:   12/13/20 0943  TempSrc:   PainSc: 6       Patients Stated Pain Goal: 5 (12/13/20 0943)  Complications: No complications documented.

## 2020-12-13 NOTE — Interval H&P Note (Signed)
History and Physical Interval Note:  12/13/2020 10:49 AM  Laurie Casey  has presented today for surgery, with the diagnosis of RIGHT URETERAL STONE.  The various methods of treatment have been discussed with the patient and family. After consideration of risks, benefits and other options for treatment, the patient has consented to  Procedure(s): CYSTOSCOPY RIGHT URETEROSCOPY/HOLMIUM LASER/STENT EXTRACTION AND RIGHT JJ STENT PLACEMENT (Right) as a surgical intervention.  The patient's history has been reviewed, patient examined, no change in status, stable for surgery.  I have reviewed the patient's chart and labs.  Questions were answered to the patient's satisfaction.     Bertram Millard Cherlynn Popiel

## 2020-12-13 NOTE — H&P (Signed)
H&P  Chief Complaint: Kidney stone  History of Present Illness: Laurie Casey is a 35 y.o. year old female presenting for URS/HLL of a 6 mm proximal ureteral stone. She underwent urgent stenting for associated UTI on 5.19.2022. Her UTI has been appropriately treated.  Past Medical History:  Diagnosis Date  . Anxiety   . Asthma    with pregnancy  . History of kidney stones   . History of migraine   . Pre-eclampsia     Past Surgical History:  Procedure Laterality Date  . CESAREAN SECTION  2013, 2015, 2019   x2  . CYSTOSCOPY  2008 or 2009  . CYSTOSCOPY W/ URETERAL STENT PLACEMENT Right 11/29/2020   Procedure: CYSTOSCOPY WITH RETROGRADE PYELOGRAM/URETERAL STENT PLACEMENT;  Surgeon: Marcine Matar, MD;  Location: [facility];  Service: Urology;  Laterality: Right;  . URETER SURGERY     x2  . WISDOM TOOTH EXTRACTION      Home Medications:  No medications prior to admission.    Allergies:  Allergies  Allergen Reactions  . Tape Rash    History reviewed. No pertinent family history.  Social History:  reports that she quit smoking about 2 months ago. Her smoking use included cigarettes. She smoked 0.25 packs per day. She has never used smokeless tobacco. She reports previous alcohol use. She reports that she does not use drugs.  ROS: A complete review of systems was performed.  All systems are negative except for pertinent findings as noted.  Physical Exam:  Vital signs in last 24 hours: Weight:  [74.8 kg] 74.8 kg (06/01 1525) General:  Alert and oriented, No acute distress HEENT: Normocephalic, atraumatic Neck: No JVD or lymphadenopathy Cardiovascular: Regular rate  Lungs: Normal inspiratory/expiratory excursion Abdomen: Soft, nontender, nondistended, no abdominal masses Back: No CVA tenderness Extremities: No edema Neurologic: Grossly intact  Laboratory Data:  No results found for this or any previous visit (from the past 24 hour(s)). No results found for this or  any previous visit (from the past 240 hour(s)). Creatinine: No results for input(s): CREATININE in the last 168 hours.  Radiologic Imaging: No results found.  Impression/Assessment:  6 mm proximal rt ureteral stone, s/p urgent stenting 5.19.2022.  Plan:  Cysto, Rt J2 stent extraction, Rt URS,HLL,extracion, possible J2 stent replacement  Bertram Millard Shail Urbas 12/13/2020, 7:25 AM  Bertram Millard. [name] MD

## 2020-12-13 NOTE — Anesthesia Preprocedure Evaluation (Signed)
Anesthesia Evaluation  Patient identified by MRN, date of birth, ID band Patient awake    Reviewed: Allergy & Precautions, NPO status , Patient's Chart, lab work & pertinent test results  Airway Mallampati: II  TM Distance: >3 FB Neck ROM: Full    Dental  (+) Chipped,    Pulmonary asthma , former smoker,    Pulmonary exam normal breath sounds clear to auscultation       Cardiovascular hypertension, Normal cardiovascular exam Rhythm:Regular Rate:Normal     Neuro/Psych Anxiety negative neurological ROS     GI/Hepatic negative GI ROS, Neg liver ROS,   Endo/Other  negative endocrine ROS  Renal/GU negative Renal ROS  negative genitourinary   Musculoskeletal negative musculoskeletal ROS (+)   Abdominal   Peds  Hematology negative hematology ROS (+)   Anesthesia Other Findings   Reproductive/Obstetrics                             Anesthesia Physical Anesthesia Plan  ASA: II  Anesthesia Plan: General   Post-op Pain Management:    Induction: Intravenous  PONV Risk Score and Plan: 4 or greater and Treatment may vary due to age or medical condition, Midazolam and Ondansetron  Airway Management Planned: LMA  Additional Equipment:   Intra-op Plan:   Post-operative Plan: Extubation in OR  Informed Consent: I have reviewed the patients History and Physical, chart, labs and discussed the procedure including the risks, benefits and alternatives for the proposed anesthesia with the patient or authorized representative who has indicated his/her understanding and acceptance.     Dental advisory given  Plan Discussed with: CRNA and Anesthesiologist  Anesthesia Plan Comments:         Anesthesia Quick Evaluation

## 2020-12-13 NOTE — Op Note (Signed)
Preoperative diagnosis: Right upper ureteral stone, status post urgent stenting for infection/obstruction  Postoperative diagnosis: Same  Principal procedure: Cystoscopy, right double-J stent extraction, right retrograde ureteropyelogram, fluoroscopic interpretation, right ureteroscopy, holmium laser dusting of right ureteral stone, replacement of 6 French by 24 cm contour double-J stent with tether  Surgeon: Arnella Pralle  Anesthesia: General with LMA  Complications: None para specimen: None  Drains: None  Estimated blood loss: Less than 5 mL  Indications: 35 year old female status post urgent stenting of an infected, obstructing right upper ureteral stone on 11/29/2020.  She has been treated for urinary tract infection.  She presents at this time for definitive management of this obstructing stone following stenting.  I have discussed the procedure with the patient, including risks, complications, expected outcomes.  I have also discussed the fact that she may have a stent replaced although she has had significant symptoms/pain from prior stenting.  She understands and desires to proceed.  Findings: Bladder inspection revealed normal urothelium.  Stent present at the right ureteral orifice with mild erythematous urothelium around this.  Retrograde study of the right ureter following removal of the stent was performed with a 6 [place] open-ended catheter and Omnipaque.  This revealed a normal mid and distal ureter.  In the more proximal ureter, there was significant obstruction with minimal contrast getting by the obstructing stone.  There was moderate pyelocaliectasis.  Description of procedure: The patient was properly identified and marked in the holding area.  She was taken to the operating room where general anesthetic was administered with the LMA.  She was placed in the dorsolithotomy position.  Genitalia and perineum were prepped, draped, proper timeout performed.  IV antibiotics were  administered.  21 French panendoscope was advanced into the bladder and systematic inspection was performed.  The stent was then grasped and extracted.  Using a 6 [place] open-ended catheter, the above-mentioned retrograde ureteropyelogram was performed with the findings as previously discussed.  I passed to 0.038 inch sensor tip guidewire through the open-ended catheter.  At this point, both the cystoscope and the open-ended catheter were removed with the guidewire left indwelling.  I then passed a 6 French dual-lumen semirigid ureteroscope.  This easily was advanced up to the stone.  There was significant ureteral reaction just below the stone.  For this reason, I felt that at the end of the procedure would be best to leave a stent indwelling for a few days.  The 365 m fiber was then advanced through the ureteroscope and the Moses holmium laser was then set at dusting pattern, and the stone was fragmented into multiple minuscule fragments.  I did not feel any of these would be difficult for the patient to easily pass.  The scope was advanced up to the UPJ, no further stones were seen.  No sizable fragments were noted elsewhere in the ureter and the cystoscope was removed.  I then backloaded the guidewire through the cystoscope and a 24 cm x 6 French contour double-J stent was deployed in the ureter with excellent proximal and distal curl seen after the guidewire was removed.  The bladder was drained.  The scope was removed.  I then tied the stent string in a knot just outside the urethral meatus.  It was trimmed and the tether was then tucked within the vagina.  At this point the procedure was terminated.  The patient was awakened, taken to the PACU in stable condition, having tolerated the procedure well.

## 2020-12-14 ENCOUNTER — Encounter (HOSPITAL_COMMUNITY): Payer: Self-pay | Admitting: Urology

## 2021-03-04 ENCOUNTER — Encounter (HOSPITAL_COMMUNITY): Payer: Self-pay

## 2021-03-04 ENCOUNTER — Ambulatory Visit (HOSPITAL_COMMUNITY)
Admission: EM | Admit: 2021-03-04 | Discharge: 2021-03-04 | Disposition: A | Discharge status: Home / Self Care | Payer: Medicaid Other

## 2021-03-04 ENCOUNTER — Other Ambulatory Visit: Payer: Self-pay

## 2021-03-04 DIAGNOSIS — R202 Paresthesia of skin: Secondary | ICD-10-CM | POA: Diagnosis not present

## 2021-03-04 DIAGNOSIS — M25512 Pain in left shoulder: Secondary | ICD-10-CM | POA: Diagnosis not present

## 2021-03-04 DIAGNOSIS — R52 Pain, unspecified: Secondary | ICD-10-CM | POA: Diagnosis not present

## 2021-03-04 MED ORDER — GABAPENTIN 300 MG PO CAPS: 300.0000 mg | ORAL_CAPSULE | Freq: Every day | ORAL | 0 refills | Status: DC

## 2021-03-04 NOTE — Discharge Instructions (Addendum)
Start gabapentin 300 mg at night.  This medication can make you feel dizzy and lightheaded so be sure not to drive or drink alcohol while taking it.  You can use over-the-counter medications including Tylenol and ibuprofen for additional symptom relief.  Recommend you follow-up with your primary care provider within a week if symptoms or not improving to consider referral to neurologist as we discussed.  If you do have improvement with gabapentin and you are unable to see your primary care provider you can return here for medication adjustment.  If you have any sudden severe symptoms including weakness, numbness, tingling sensation you need to be seen immediately.

## 2021-03-04 NOTE — ED Triage Notes (Signed)
Pt seen in UC w/ c/o of left shoulder and back pain x1.5 weeks. Pt has tried a variety of medications, heat, and ice with no relief. Pt states on occasion her left arm will go numb. Pt unaware of any injury.

## 2021-03-04 NOTE — ED Provider Notes (Signed)
[facility]    CSN: [id number] Arrival date & time: 03/04/21  1738      History   Chief Complaint Chief Complaint  Patient presents with   Back Pain   Shoulder Pain    Lt side    HPI Laurie Casey is a 35 y.o. female.   Patient presents today with a 1.5-week history of left-sided shoulder/back pain.  She denies any known injury or increase in activity prior to symptom onset.  She reports area is a very specific region of her medial back near her scapula.  Reports symptoms are rated 4 at rest but increases to 10 with palpation or attempted movement, described as intense burning/tenderness, no alleviating factors identified.  She is right-handed.  She does report occasional numbness involving her left hand but states this is since resolved.  She denies any previous injury or surgery involving neck or back.  She denies any associated rash, fever, nausea, vomiting.  She denies episodes of similar symptoms in the past.  She has tried numerous medications including Percocet, tizanidine, ibuprofen 800 mg, Tylenol without improvement of symptoms.  She is unable to perform daily activities as result of symptoms.   Past Medical History:  Diagnosis Date   Anxiety    Asthma    with pregnancy   History of kidney stones    History of migraine    Pre-eclampsia     Patient Active Problem List   Diagnosis Date Noted   Calculus of ureter 11/29/2020    Past Surgical History:  Procedure Laterality Date   CESAREAN SECTION  2013, 2015, 2019   x2   CYSTOSCOPY  2008 or 2009   CYSTOSCOPY W/ URETERAL STENT PLACEMENT Right 11/29/2020   Procedure: CYSTOSCOPY WITH RETROGRADE PYELOGRAM/URETERAL STENT PLACEMENT;  Surgeon: Marcine Matar, MD;  Location: [facility];  Service: Urology;  Laterality: Right;   CYSTOSCOPY/URETEROSCOPY/HOLMIUM LASER/STENT PLACEMENT Right 12/13/2020   Procedure: CYSTOSCOPY RIGHT URETEROSCOPY/HOLMIUM LASER/STENT EXTRACTION AND RIGHT JJ STENT PLACEMENT, RIGHT  RETROGRADE URETEROSCOPY;  Surgeon: Marcine Matar, MD;  Location: [facility];  Service: Urology;  Laterality: Right;   URETER SURGERY     x2   WISDOM TOOTH EXTRACTION      OB History   No obstetric history on file.      Home Medications    Prior to Admission medications   Medication Sig Start Date End Date Taking? Authorizing Provider  acetaminophen (TYLENOL) 325 MG tablet Take 650 mg by mouth every 6 (six) hours as needed.   Yes [provider]  gabapentin (NEURONTIN) 300 MG capsule Take 1 capsule (300 mg total) by mouth at bedtime. 03/04/21  Yes Marquay Kruse, Noberto Retort, PA-C  oxyCODONE-acetaminophen (PERCOCET) 10-325 MG tablet Take 0.5 tablets by mouth every 4 (four) hours as needed for pain.   Yes [provider]  tiZANidine (ZANAFLEX) 4 MG tablet Take 4 mg by mouth every 6 (six) hours as needed for muscle spasms.   Yes [provider]  ALPRAZolam (XANAX) 0.25 MG tablet Take 0.125 mg by mouth daily as needed for anxiety.    [provider]  buPROPion (WELLBUTRIN SR) 150 MG 12 hr tablet Take 200 mg by mouth 2 (two) times daily.    [provider]  ibuprofen (ADVIL) 200 MG tablet Take 600 mg by mouth every 8 (eight) hours as needed (pain).    [provider]  oxybutynin (DITROPAN) 5 MG tablet Take 1 tablet (5 mg total) by mouth every 8 (eight) hours as needed for  up to 15 doses for bladder spasms. Patient not taking: No sig reported 11/29/20   Marcine Matar, MD    Family History History reviewed. No pertinent family history.  Social History Social History   Tobacco Use   Smoking status: Former    Packs/day: 0.25    Types: Cigarettes    Quit date: 10/12/2020    Years since quitting: 0.3   Smokeless tobacco: Never   Tobacco comments:    off and on   Vaping Use   Vaping Use: Never used  Substance Use Topics   Alcohol use: Not Currently   Drug use: Never     Allergies   Tape   Review of Systems Review of Systems   Constitutional:  Positive for activity change. Negative for appetite change, fatigue and fever.  Respiratory:  Negative for cough and shortness of breath.   Cardiovascular:  Negative for chest pain.  Gastrointestinal:  Negative for abdominal pain, diarrhea, nausea and vomiting.  Musculoskeletal:  Positive for back pain and myalgias. Negative for arthralgias and neck pain.  Skin:  Negative for rash.  Neurological:  Negative for dizziness, weakness, light-headedness, numbness and headaches.    Physical Exam Triage Vital Signs ED Triage Vitals  Enc Vitals Group     BP 03/04/21 1801 (!) 142/92     Pulse Rate 03/04/21 1801 86     Resp 03/04/21 1801 12     Temp 03/04/21 1801 98.6 F (37 C)     Temp Source 03/04/21 1801 Oral     SpO2 03/04/21 1801 99 %     Weight 03/04/21 1803 165 lb (74.8 kg)     Height 03/04/21 1803 5\' 5"  (1.651 m)     Head Circumference --      Peak Flow --      Pain Score 03/04/21 1803 4     Pain Loc --      Pain Edu? --      Excl. in [place]? --    No data found.  Updated Vital Signs BP (!) 142/92 (BP Location: Right Arm)   Pulse 86   Temp 98.6 F (37 C) (Oral)   Resp 12   Ht 5\' 5"  (1.651 m)   Wt 165 lb (74.8 kg)   SpO2 99%   BMI 27.46 kg/m   Visual Acuity Right Eye Distance:   Left Eye Distance:   Bilateral Distance:    Right Eye Near:   Left Eye Near:    Bilateral Near:     Physical Exam Vitals reviewed.  Constitutional:      General: She is awake. She is not in acute distress.    Appearance: Normal appearance. She is normal weight. She is not ill-appearing.     Comments: Very pleasant female appears stated age in no acute distress sitting comfortably in exam room  HENT:     Head: Normocephalic and atraumatic.  Cardiovascular:     Rate and Rhythm: Normal rate and regular rhythm.     Heart sounds: Normal heart sounds, S1 normal and S2 normal. No murmur heard. Pulmonary:     Effort: Pulmonary effort is normal.     Breath sounds: Normal  breath sounds. No wheezing, rhonchi or rales.     Comments: Clear to auscultation bilaterally Abdominal:     Palpations: Abdomen is soft.     Tenderness: There is no abdominal tenderness.  Musculoskeletal:     Cervical back: No tenderness or bony tenderness.     Thoracic back:  Tenderness present. No bony tenderness.     Lumbar back: No tenderness or bony tenderness.       Back:     Comments: Tender palpation over thoracic back musculature medial to left scapula.  No deformity noted.  No rash or hyperpigmentation noted.  Strength 5/5 bilateral upper extremities at shoulder and elbow.  Normal active range of motion at shoulder and elbow bilaterally.  Hand neurovascularly intact.  Psychiatric:        Behavior: Behavior is cooperative.       UC Treatments / Results  Labs (all labs ordered are listed, but only abnormal results are displayed) Labs Reviewed - No data to display  EKG   Radiology No results found.  Procedures Procedures (including critical care time)  Medications Ordered in UC Medications - No data to display  Initial Impression / Assessment and Plan / UC Course  I have reviewed the triage vital signs and the nursing notes.  Pertinent labs & imaging results that were available during my care of the patient were reviewed by me and considered in my medical decision making (see chart for details).      No indication for x-ray as patient denies any recent trauma and had no bony tenderness on exam.  Concern for notalgia paresthetica given clinical presentation.  Patient has tried muscle relaxers and pain medication so will attempt gabapentin.  She was prescribed gabapentin 300 mg to be taken at night but can adjust the dose after few days if symptoms persist despite this medication.  Discussed the importance of following up with her primary care provider as she may need a referral to neurology for further evaluation and management.  Discussed alarm symptoms that would  warrant emergent evaluation.  Strict return precautions given to which patient expressed understanding.  Final Clinical Impressions(s) / UC Diagnoses   Final diagnoses:  Notalgia paresthetica  Acute pain of left shoulder  Burning pain     Discharge Instructions      Start gabapentin 300 mg at night.  This medication can make you feel dizzy and lightheaded so be sure not to drive or drink alcohol while taking it.  You can use over-the-counter medications including Tylenol and ibuprofen for additional symptom relief.  Recommend you follow-up with your primary care provider within a week if symptoms or not improving to consider referral to neurologist as we discussed.  If you do have improvement with gabapentin and you are unable to see your primary care provider you can return here for medication adjustment.  If you have any sudden severe symptoms including weakness, numbness, tingling sensation you need to be seen immediately.     ED Prescriptions     Medication Sig Dispense Auth. Provider   gabapentin (NEURONTIN) 300 MG capsule Take 1 capsule (300 mg total) by mouth at bedtime. 30 capsule Jalyah Weinheimer K, PA-C      I have reviewed the PDMP during this encounter.   Jeani Hawking, PA-C 03/04/21 1831

## 2021-03-05 ENCOUNTER — Telehealth (HOSPITAL_COMMUNITY): Payer: Self-pay | Admitting: Emergency Medicine

## 2021-03-05 NOTE — Telephone Encounter (Signed)
Pt contacted UC to discuss medication given at visit. Per Dorann Ou, PA pt advised to taken gabapentin for 3 nights before increasing dose. May take 1 dose during the day if not driving or drinking alcohol. Pt advised to take tylenol or ibuprofen or icy hot for day time relief.

## 2021-11-07 ENCOUNTER — Ambulatory Visit (HOSPITAL_COMMUNITY)
Admission: EM | Admit: 2021-11-07 | Discharge: 2021-11-07 | Disposition: A | Discharge status: Home / Self Care | Payer: Medicaid Other | Attending: Family Medicine | Admitting: Family Medicine

## 2021-11-07 ENCOUNTER — Encounter (HOSPITAL_COMMUNITY): Payer: Self-pay | Admitting: Emergency Medicine

## 2021-11-07 DIAGNOSIS — R21 Rash and other nonspecific skin eruption: Secondary | ICD-10-CM

## 2021-11-07 DIAGNOSIS — L249 Irritant contact dermatitis, unspecified cause: Secondary | ICD-10-CM | POA: Diagnosis not present

## 2021-11-07 MED ORDER — METHYLPREDNISOLONE SODIUM SUCC 125 MG IJ SOLR
INTRAMUSCULAR | Status: AC
  Filled 2021-11-07: qty 2

## 2021-11-07 MED ORDER — TRIAMCINOLONE ACETONIDE 0.5 % EX OINT: 1.0000 "application " | TOPICAL_OINTMENT | Freq: Two times a day (BID) | CUTANEOUS | 0 refills | Status: DC

## 2021-11-07 MED ORDER — PREDNISONE 20 MG PO TABS: ORAL_TABLET | ORAL | 0 refills | Status: DC

## 2021-11-07 MED ORDER — METHYLPREDNISOLONE SODIUM SUCC 125 MG IJ SOLR
125.0000 mg | Freq: Once | INTRAMUSCULAR | Status: AC
  Administered 2021-11-07: 125 mg via INTRAMUSCULAR

## 2021-11-07 NOTE — ED Triage Notes (Signed)
Pt c/o rash that started over the weekend after working in yard at her new house. Reports rash has drainage, and painful and keeps spreading.  ?

## 2021-11-07 NOTE — ED Provider Notes (Signed)
?[facility] ? ? ? ?CSN: [id number] ?Arrival date & time: 11/07/21  0805 ? ? ?  ? ?History   ?Chief Complaint ?Chief Complaint  ?Patient presents with  ? Rash  ? ? ?HPI ?Laurie Casey is a 36 y.o. female.  ? ?Patient is here for a rash.  ?Started on Sunday after being in the yard the two previous days.  Has a rash that is spreading, painful.  Located on the arms, chest, torso, nipples.  More painful than anything.  ?Using topical cream without much help.  ?She used to get poison ivy a lot, but does not think this is it.  ?She has gotten injections in the past with help, usually oral prednisone makes her angry.  ? ?Past Medical History:  ?Diagnosis Date  ? Anxiety   ? Asthma   ? with pregnancy  ? History of kidney stones   ? History of migraine   ? Pre-eclampsia   ? ? ?Patient Active Problem List  ? Diagnosis Date Noted  ? Calculus of ureter 11/29/2020  ? ? ?Past Surgical History:  ?Procedure Laterality Date  ? CESAREAN SECTION  2013, 2015, 2019  ? x2  ? CYSTOSCOPY  2008 or 2009  ? CYSTOSCOPY W/ URETERAL STENT PLACEMENT Right 11/29/2020  ? Procedure: CYSTOSCOPY WITH RETROGRADE PYELOGRAM/URETERAL STENT PLACEMENT;  Surgeon: Franchot Gallo, MD;  Location: [facility];  Service: Urology;  Laterality: Right;  ? CYSTOSCOPY/URETEROSCOPY/HOLMIUM LASER/STENT PLACEMENT Right 12/13/2020  ? Procedure: CYSTOSCOPY RIGHT URETEROSCOPY/HOLMIUM LASER/STENT EXTRACTION AND RIGHT JJ STENT PLACEMENT, RIGHT RETROGRADE URETEROSCOPY;  Surgeon: Franchot Gallo, MD;  Location: [facility];  Service: Urology;  Laterality: Right;  ? URETER SURGERY    ? x2  ? WISDOM TOOTH EXTRACTION    ? ? ?OB History   ?No obstetric history on file. ?  ? ? ? ?Home Medications   ? ?Prior to Admission medications   ?Medication Sig Start Date End Date Taking? Authorizing Provider  ?acetaminophen (TYLENOL) 325 MG tablet Take 650 mg by mouth every 6 (six) hours as needed.    [provider]  ?ALPRAZolam Duanne Moron) 0.25 MG tablet Take 0.125 mg by mouth  daily as needed for anxiety.    [provider]  ?buPROPion (WELLBUTRIN SR) 150 MG 12 hr tablet Take 200 mg by mouth 2 (two) times daily.    [provider]  ?gabapentin (NEURONTIN) 300 MG capsule Take 1 capsule (300 mg total) by mouth at bedtime. 03/04/21   Raspet, Derry Skill, PA-C  ?ibuprofen (ADVIL) 200 MG tablet Take 600 mg by mouth every 8 (eight) hours as needed (pain).    [provider]  ?oxyCODONE-acetaminophen (PERCOCET) 10-325 MG tablet Take 0.5 tablets by mouth every 4 (four) hours as needed for pain.    [provider]  ?tiZANidine (ZANAFLEX) 4 MG tablet Take 4 mg by mouth every 6 (six) hours as needed for muscle spasms.    [provider]  ? ? ?Family History ?No family history on file. ? ?Social History ?Social History  ? ?Tobacco Use  ? Smoking status: Former  ?  Packs/day: 0.25  ?  Types: Cigarettes  ?  Quit date: 10/12/2020  ?  Years since quitting: 1.0  ? Smokeless tobacco: Never  ? Tobacco comments:  ?  off and on   ?Vaping Use  ? Vaping Use: Never used  ?Substance Use Topics  ? Alcohol use: Not Currently  ? Drug use: Never  ? ? ? ?Allergies   ?Tape ? ? ?Review  of Systems ?Review of Systems  ?Constitutional: Negative.   ?HENT: Negative.    ?Respiratory: Negative.    ?Cardiovascular: Negative.   ?Gastrointestinal: Negative.   ?Genitourinary: Negative.   ?Musculoskeletal: Negative.   ?Skin:  Positive for rash.  ? ? ?Physical Exam ?Triage Vital Signs ?ED Triage Vitals  ?Enc Vitals Group  ?   BP 11/07/21 0836 (!) 154/92  ?   Pulse Rate 11/07/21 0836 73  ?   Resp 11/07/21 0836 18  ?   Temp 11/07/21 0836 98 ?F (36.7 ?C)  ?   Temp Source 11/07/21 0836 Oral  ?   SpO2 11/07/21 0836 97 %  ?   Weight --   ?   Height --   ?   Head Circumference --   ?   Peak Flow --   ?   Pain Score 11/07/21 0835 3  ?   Pain Loc --   ?   Pain Edu? --   ?   Excl. in [place]? --   ? ?No data found. ? ?Updated Vital Signs ?BP (!) 154/92 (BP Location: Left Arm)   Pulse 73   Temp 98 ?F (36.7 ?C)  (Oral)   Resp 18   LMP 10/25/2021   SpO2 97%  ? ?Visual Acuity ?Right Eye Distance:   ?Left Eye Distance:   ?Bilateral Distance:   ? ?Right Eye Near:   ?Left Eye Near:    ?Bilateral Near:    ? ?Physical Exam ?Constitutional:   ?   Appearance: Normal appearance.  ?Skin: ?   Comments: Scattered rash on the arms, fingers, torso, abdomen and back;  also on the nose, around the right eye, hair line, chest and right ear;  rash is raised, crusted in areas, weeping in others;    ?Neurological:  ?   Mental Status: She is alert.  ? ? ? ?UC Treatments / Results  ?Labs ?(all labs ordered are listed, but only abnormal results are displayed) ?Labs Reviewed - No data to display ? ?EKG ? ? ?Radiology ?No results found. ? ?Procedures ?Procedures (including critical care time) ? ?Medications Ordered in UC ?Medications  ?methylPREDNISolone sodium succinate (SOLU-MEDROL) 125 mg/2 mL injection 125 mg (has no administration in time range)  ? ? ?Initial Impression / Assessment and Plan / UC Course  ?I have reviewed the triage vital signs and the nursing notes. ? ?Pertinent labs & imaging results that were available during my care of the patient were reviewed by me and considered in my medical decision making (see chart for details). ? ?  ?Final Clinical Impressions(s) / UC Diagnoses  ? ?Final diagnoses:  ?Rash  ?Irritant contact dermatitis, unspecified trigger  ? ? ? ?Discharge Instructions   ? ?  ?You were seen today for rash, likely due to plant chemical in your yard.  ?You were given a shot of solumedrol today.  ?I have sent out oral prednisone as well, that you may start tomorrow.  ?I recommend caladryl, oatmeal baths, and benadryl to help with itch.  I have sent out a topical ointment to use.  You may use this on the rash, avoiding the face and eyes.  You may use over the counter topical cortisone on the face an eyes.  ?Please launder your clothes/bed sheets separate in hot/warm water as well.  ?Please follow up if not improving  as expected.  ? ? ? ?ED Prescriptions   ? ? Medication Sig Dispense Auth. Provider  ? predniSONE (DELTASONE) 20 MG tablet 3  tabs daily x 3 days, 2 tabs daily x 3 days, 1 tab daily x 3 days,  1/2 tab daily x 3 days 20 tablet Laurie Vanostrand, MD  ? triamcinolone ointment (KENALOG) 0.5 % Apply 1 application. topically 2 (two) times daily. 30 g Laurie Oh, MD  ? ?  ? ?PDMP not reviewed this encounter. ?  Laurie Oh, MD ?11/07/21 [phone] ? ?

## 2021-11-07 NOTE — Discharge Instructions (Signed)
You were seen today for rash, likely due to plant chemical in your yard.  ?You were given a shot of solumedrol today.  ?I have sent out oral prednisone as well, that you may start tomorrow.  ?I recommend caladryl, oatmeal baths, and benadryl to help with itch.  I have sent out a topical ointment to use.  You may use this on the rash, avoiding the face and eyes.  You may use over the counter topical cortisone on the face an eyes.  ?Please launder your clothes/bed sheets separate in hot/warm water as well.  ?Please follow up if not improving as expected.  ?

## 2021-12-05 ENCOUNTER — Emergency Department (HOSPITAL_COMMUNITY): Payer: Medicaid Other

## 2021-12-05 ENCOUNTER — Encounter (HOSPITAL_COMMUNITY): Payer: Self-pay

## 2021-12-05 ENCOUNTER — Other Ambulatory Visit: Payer: Self-pay

## 2021-12-05 ENCOUNTER — Emergency Department (HOSPITAL_COMMUNITY)
Admission: EM | Admit: 2021-12-05 | Discharge: 2021-12-05 | Disposition: A | Discharge status: Home / Self Care | Payer: Medicaid Other | Attending: Emergency Medicine | Admitting: Emergency Medicine

## 2021-12-05 DIAGNOSIS — R110 Nausea: Secondary | ICD-10-CM | POA: Diagnosis not present

## 2021-12-05 DIAGNOSIS — E870 Hyperosmolality and hypernatremia: Secondary | ICD-10-CM | POA: Diagnosis not present

## 2021-12-05 DIAGNOSIS — R109 Unspecified abdominal pain: Secondary | ICD-10-CM | POA: Diagnosis present

## 2021-12-05 DIAGNOSIS — F17290 Nicotine dependence, other tobacco product, uncomplicated: Secondary | ICD-10-CM | POA: Diagnosis not present

## 2021-12-05 DIAGNOSIS — D72829 Elevated white blood cell count, unspecified: Secondary | ICD-10-CM | POA: Diagnosis not present

## 2021-12-05 DIAGNOSIS — N12 Tubulo-interstitial nephritis, not specified as acute or chronic: Secondary | ICD-10-CM | POA: Insufficient documentation

## 2021-12-05 LAB — CBC WITH DIFFERENTIAL/PLATELET
Abs Immature Granulocytes: 0.1 10*3/uL — ABNORMAL HIGH (ref 0.00–0.07)
Basophils Absolute: 0.1 10*3/uL (ref 0.0–0.1)
Basophils Relative: 1 %
Eosinophils Absolute: 0.2 10*3/uL (ref 0.0–0.5)
Eosinophils Relative: 1 %
HCT: 41.4 % (ref 36.0–46.0)
Hemoglobin: 13.4 g/dL (ref 12.0–15.0)
Immature Granulocytes: 1 %
Lymphocytes Relative: 14 %
Lymphs Abs: 2.1 10*3/uL (ref 0.7–4.0)
MCH: 31.1 pg (ref 26.0–34.0)
MCHC: 32.4 g/dL (ref 30.0–36.0)
MCV: 96.1 fL (ref 80.0–100.0)
Monocytes Absolute: 1.8 10*3/uL — ABNORMAL HIGH (ref 0.1–1.0)
Monocytes Relative: 12 %
Neutro Abs: 10.8 10*3/uL — ABNORMAL HIGH (ref 1.7–7.7)
Neutrophils Relative %: 71 %
Platelets: 197 10*3/uL (ref 150–400)
RBC: 4.31 MIL/uL (ref 3.87–5.11)
RDW: 12.1 % (ref 11.5–15.5)
WBC: 15.1 10*3/uL — ABNORMAL HIGH (ref 4.0–10.5)
nRBC: 0 % (ref 0.0–0.2)

## 2021-12-05 LAB — URINALYSIS, ROUTINE W REFLEX MICROSCOPIC
Bilirubin Urine: NEGATIVE
Glucose, UA: NEGATIVE mg/dL
Ketones, ur: NEGATIVE mg/dL
Nitrite: POSITIVE — AB
Protein, ur: NEGATIVE mg/dL
Specific Gravity, Urine: 1.013 (ref 1.005–1.030)
pH: 5 (ref 5.0–8.0)

## 2021-12-05 LAB — COMPREHENSIVE METABOLIC PANEL
ALT: 18 U/L (ref 0–44)
AST: 23 U/L (ref 15–41)
Albumin: 3.8 g/dL (ref 3.5–5.0)
Alkaline Phosphatase: 61 U/L (ref 38–126)
Anion gap: 7 (ref 5–15)
BUN: 15 mg/dL (ref 6–20)
CO2: 20 mmol/L — ABNORMAL LOW (ref 22–32)
Calcium: 9.1 mg/dL (ref 8.9–10.3)
Chloride: 107 mmol/L (ref 98–111)
Creatinine, Ser: 1.04 mg/dL — ABNORMAL HIGH (ref 0.44–1.00)
GFR, Estimated: >60 mL/min (ref >60)
Glucose, Bld: 88 mg/dL (ref 70–99)
Potassium: 3.9 mmol/L (ref 3.5–5.1)
Sodium: 134 mmol/L — ABNORMAL LOW (ref 135–145)
Total Bilirubin: 0.8 mg/dL (ref 0.3–1.2)
Total Protein: 7.1 g/dL (ref 6.5–8.1)

## 2021-12-05 LAB — I-STAT BETA HCG BLOOD, ED (MC, WL, AP ONLY): I-stat hCG, quantitative: <5 m[IU]/mL (ref <5)

## 2021-12-05 LAB — LIPASE, BLOOD: Lipase: 25 U/L (ref 11–51)

## 2021-12-05 MED ORDER — KETOROLAC TROMETHAMINE 15 MG/ML IJ SOLN
15.0000 mg | Freq: Once | INTRAMUSCULAR | Status: AC
  Administered 2021-12-05: 15 mg via INTRAVENOUS
  Filled 2021-12-05: qty 1

## 2021-12-05 MED ORDER — ONDANSETRON HCL 4 MG/2ML IJ SOLN
4.0000 mg | Freq: Once | INTRAMUSCULAR | Status: AC
  Administered 2021-12-05: 4 mg via INTRAVENOUS
  Filled 2021-12-05: qty 2

## 2021-12-05 MED ORDER — ONDANSETRON HCL 4 MG PO TABS: 4.0000 mg | ORAL_TABLET | Freq: Three times a day (TID) | ORAL | 0 refills | Status: DC | PRN

## 2021-12-05 MED ORDER — MORPHINE SULFATE (PF) 4 MG/ML IV SOLN
4.0000 mg | Freq: Once | INTRAVENOUS | Status: AC
  Administered 2021-12-05: 4 mg via INTRAVENOUS
  Filled 2021-12-05: qty 1

## 2021-12-05 MED ORDER — CEPHALEXIN 500 MG PO CAPS: 500.0000 mg | ORAL_CAPSULE | Freq: Three times a day (TID) | ORAL | 0 refills | Status: DC

## 2021-12-05 MED ORDER — SODIUM CHLORIDE 0.9 % IV SOLN
1.0000 g | Freq: Once | INTRAVENOUS | Status: AC
  Administered 2021-12-05: 1 g via INTRAVENOUS
  Filled 2021-12-05: qty 10

## 2021-12-05 MED ORDER — IBUPROFEN 600 MG PO TABS: 600.0000 mg | ORAL_TABLET | Freq: Four times a day (QID) | ORAL | 0 refills | Status: DC | PRN

## 2021-12-05 NOTE — ED Triage Notes (Signed)
Pt arrived POV from home c/o a possible kidney stone that started bothering her last night. Pt states she has a hx of having kidney stones and a year ago had one obstructed. Pt states she took advil and tylenol last night and still could not get the pain under control.

## 2021-12-05 NOTE — Discharge Instructions (Signed)
You have been evaluated for your symptoms.  Findings suggest that you have a kidney infection.  Please take antibiotic as prescribed.  Take Zofran as needed for nausea, you may take ibuprofen or Advil as needed for pain.  Follow-up with urologist after your symptoms resolve for reassessment as they also swelling noted to your right ureter which could be due to infection but a repeat CT scan is beneficial to make sure that it has resolved.

## 2021-12-05 NOTE — ED Provider Notes (Signed)
[facility] EMERGENCY DEPARTMENT Provider Note   CSN: [id number] Arrival date & time: 12/05/21  0813     History  Chief Complaint  Patient presents with   Flank Pain    Laurie Casey is a 36 y.o. female.  The history is provided by the patient and medical records. No language interpreter was used.   36 year old female reported history of kidney stone presented to ED with complaint of flank pain.  Patient reported acute onset of right flank pain that started last night around 9 PM while she was sitting in her recliner crocheting.  The pain is intense, nonradiating, nothing seems to make it better or worse.  She tries taking Advil and Tylenol without relief.  She endorsed nausea.  She does not endorse any fever, dysuria or hematuria.  She has had kidney stone to the right side years ago requiring stenting.  She has had history of ureteral reflux when she was young affecting the right kidney and recurrent UTI.  She denies fever chest pain abdominal pain recent injury, heavy lifting, or rash.  Home Medications Prior to Admission medications   Medication Sig Start Date End Date Taking? Authorizing Provider  acetaminophen (TYLENOL) 325 MG tablet Take 650 mg by mouth every 6 (six) hours as needed.    [provider]  ALPRAZolam Duanne Moron) 0.25 MG tablet Take 0.125 mg by mouth daily as needed for anxiety.    [provider]  buPROPion (WELLBUTRIN SR) 150 MG 12 hr tablet Take 200 mg by mouth 2 (two) times daily.    [provider]  gabapentin (NEURONTIN) 300 MG capsule Take 1 capsule (300 mg total) by mouth at bedtime. 03/04/21   Raspet, Derry Skill, PA-C  ibuprofen (ADVIL) 200 MG tablet Take 600 mg by mouth every 8 (eight) hours as needed (pain).    [provider]  oxyCODONE-acetaminophen (PERCOCET) 10-325 MG tablet Take 0.5 tablets by mouth every 4 (four) hours as needed for pain.    [provider]  predniSONE (DELTASONE) 20 MG tablet  3 tabs daily x 3 days, 2 tabs daily x 3 days, 1 tab daily x 3 days,  1/2 tab daily x 3 days 11/07/21   Rondel Oh, MD  tiZANidine (ZANAFLEX) 4 MG tablet Take 4 mg by mouth every 6 (six) hours as needed for muscle spasms.    [provider]  triamcinolone ointment (KENALOG) 0.5 % Apply 1 application. topically 2 (two) times daily. 11/07/21   Rondel Oh, MD      Allergies    Tape    Review of Systems   Review of Systems  All other systems reviewed and are negative.  Physical Exam Updated Vital Signs BP (!) 146/100   Pulse (!) 112   Temp 98 F (36.7 C) (Oral)   Resp 18   Ht 5\' 5"  (1.651 m)   Wt 90.7 kg   SpO2 99%   BMI 33.28 kg/m  Physical Exam Vitals and nursing note reviewed.  Constitutional:      General: She is not in acute distress.    Appearance: She is well-developed.  HENT:     Head: Atraumatic.  Eyes:     Conjunctiva/sclera: Conjunctivae normal.  Cardiovascular:     Rate and Rhythm: Normal rate and regular rhythm.     Pulses: Normal pulses.     Heart sounds: Normal heart sounds.  Pulmonary:     Effort: Pulmonary effort is normal.  Abdominal:     Palpations:  Abdomen is soft.     Tenderness: There is no abdominal tenderness. There is right CVA tenderness. There is no left CVA tenderness.  Musculoskeletal:     Cervical back: Neck supple.  Skin:    Findings: No rash.  Neurological:     Mental Status: She is alert.  Psychiatric:        Mood and Affect: Mood normal.    ED Results / Procedures / Treatments   Labs (all labs ordered are listed, but only abnormal results are displayed) Labs Reviewed  CBC WITH DIFFERENTIAL/PLATELET - Abnormal; Notable for the following components:      Result Value   WBC 15.1 (*)    Neutro Abs 10.8 (*)    Monocytes Absolute 1.8 (*)    Abs Immature Granulocytes 0.10 (*)    All other components within normal limits  COMPREHENSIVE METABOLIC PANEL - Abnormal; Notable for the following components:   Sodium 134 (*)     CO2 20 (*)    Creatinine, Ser 1.04 (*)    All other components within normal limits  URINALYSIS, ROUTINE W REFLEX MICROSCOPIC - Abnormal; Notable for the following components:   Hgb urine dipstick SMALL (*)    Nitrite POSITIVE (*)    Leukocytes,Ua LARGE (*)    Bacteria, UA MANY (*)    All other components within normal limits  URINE CULTURE  LIPASE, BLOOD  I-STAT BETA HCG BLOOD, ED ([facility], [facility], [facility] ONLY)    EKG None  Radiology US RENAL  Result Date: 12/05/2021 CLINICAL DATA:  Right flank pain for 1 month. EXAM: RENAL / URINARY TRACT ULTRASOUND COMPLETE COMPARISON:  CT abdomen and pelvis 11/28/2020 FINDINGS: Right Kidney: Renal measurements: 9.6 x 4.9 x 4.8 cm = volume: 116 mL. Echogenicity within normal limits. No mass. Mild caliectasis. Left Kidney: Renal measurements: 12.7 x 6.1 x 5.7 cm = volume: 230 mL. Echogenicity within normal limits. No mass or hydronephrosis visualized. Bladder: Appears normal for degree of bladder distention. Other: None. IMPRESSION: Mild right renal caliectasis, otherwise unremarkable renal ultrasound. Electronically Signed   By: Logan Bores M.D.   On: 12/05/2021 09:59   CT Renal Stone Study  Result Date: 12/05/2021 CLINICAL DATA:  Right flank pain EXAM: CT ABDOMEN AND PELVIS WITHOUT CONTRAST TECHNIQUE: Multidetector CT imaging of the abdomen and pelvis was performed following the standard protocol without IV contrast. RADIATION DOSE REDUCTION: This exam was performed according to the departmental dose-optimization program which includes automated exposure control, adjustment of the mA and/or kV according to patient size and/or use of iterative reconstruction technique. COMPARISON:  11/28/2020 FINDINGS: Lower chest: Unremarkable. Hepatobiliary: No focal abnormality is seen. There is no dilation of bile ducts. Gallbladder is unremarkable. Pancreas: No focal abnormality is seen. Spleen: Unremarkable. Adrenals/Urinary Tract: Adrenals are unremarkable. Left kidney is  unremarkable. Right kidney is smaller than left. Linear densities are noted in the perinephric region posterior to the upper pole of right kidney. There is mild perinephric stranding adjacent to the lower pole of right kidney. There are small calcifications in the renal cortex in the upper pole of right kidney around 11 mm low-density structure. This finding may suggest presence of calcifications in the margin of renal cyst. There is 2 mm calcific density in the lower pole of right kidney. Ureters are not dilated. Urinary bladder is not distended. There is asymmetric wall thickening in the right posterolateral margin of the urinary bladder measuring 6 mm. This finding was not evident in the previous study. Stomach/Bowel: Stomach is unremarkable.  Small bowel loops are not dilated. Appendix is not dilated. There is no significant wall thickening in colon. There is no pericolic stranding. Vascular/Lymphatic: Unremarkable. Reproductive: Uterus is unremarkable. There is 2.5 cm smooth marginated fluid density structure in the left adnexa. This may suggest functional cyst or dominant follicle in the left ovary. Other: There is no pneumoperitoneum. There is no ascites. Umbilical hernia containing fat is seen. Musculoskeletal: Unremarkable. IMPRESSION: There is no evidence of intestinal obstruction or pneumoperitoneum. There is no hydronephrosis. Appendix is not dilated. There is mild stranding in the fat planes adjacent to the upper pole and lower pole of right kidney. This may suggest scarring from previous intervention or acute pyelonephritis. Please correlate with clinical symptoms and laboratory findings. There is small calculus in the lower pole of right kidney. There are few small calcific densities in the upper pole of right kidney around 11 mm fluid density structure suggesting possible calcifications in the wall of renal cyst or small renal stones. There is mild asymmetric wall thickening in the right  posterolateral margin of urinary bladder close to the area of right ureterovesical junction. This may suggest scarring or inflammatory or neoplastic process in the bladder wall. Please correlate with clinical symptoms and consider endoscopy. Other findings as described in the body of the report. Electronically Signed   By: Elmer Picker M.D.   On: 12/05/2021 12:08    Procedures Procedures    Medications Ordered in ED Medications - No data to display  ED Course/ Medical Decision Making/ A&P                           Medical Decision Making Amount and/or Complexity of Data Reviewed Labs: ordered. Radiology: ordered.  Risk Prescription drug management.   BP (!) 146/100   Pulse (!) 112   Temp 98 F (36.7 C) (Oral)   Resp 18   Ht 5\' 5"  (1.651 m)   Wt 90.7 kg   SpO2 99%   BMI 33.28 kg/m   8:39 AM This is a 36 year old female with significant history of kidney stones presenting with acute onset of right flank pain that started last night.  Symptoms felt similar to prior kidney stone.  Patient appears uncomfortable.  She does not endorse any urinary discomfort.  She does endorse nausea.  On exam she does have right CVA tenderness suggestive of kidney stone.  Vital signs remarkable for tachycardia with heart rate of 112.  This is likely secondary to pain.  Work-up initiated, will give morphine for pain control and Zofran to help with nausea.  I have consider pyelonephritis, aortic dissection, shingle, or muscle strain but felt these are less likely on my differential.  10:12 AM Labs independently reviewed interpreted by me.  Labs remarkable for elevated white count of 15.1, a urinalysis showing large leukocyte esterase, many bacteria, and nitrite positive concerning for urinary tract infection.  Renal ultrasound study demonstrate mild right renal calyectasis, otherwise unremarkable renal ultrasound.  No report of any mass or hydronephrosis or visualized.  12:50 PM Pregnancy test  is negative.  At this time I am concerned for potential pyelonephritis caused by obstructive ureteral stone.  I have initiated antibiotic which includes Rocephin, urine culture has been sent, will consider abdominal pelvis CT scan for further assessment.  CT scan of the abdomen and pelvis was obtained which demonstrate mild stranding adjacent to the right kidney suggestive of either scarring or acute pyelonephritis.  Small calculus in the  lower pole of the right kidney suggestive of either calcification of the wall of the renal cyst or small renal stone.  Asymmetric wall thickening in the right posterolateral margin of the urinary bladder close to the right ureteral vesicular junction which may suggest scarring or inflammatory or neoplastic process in the bladder wall.  Given the finding of the CT scan I suspect patient may have passed a kidney stone but also having pain from urinary tract infection/pyelonephritis.  I do think antibiotic would be appropriate.  There are no large obstructing kidney stone at this time.  Anticipate patient may benefit from a repeat CT scan at a later time to ensure resolutions of the wall thickening near her bladder as it could be inflammatory or neoplastic process.  I discussed this with patient.  I have considered a hospital admission however as patient able to tolerate p.o. and his symptoms did improved, I felt patient can be discharged home with antibiotics with return precaution given.   This patient presents to the ED for concern of flank pain, this involves an extensive number of treatment options, and is a complaint that carries with it a high risk of complications and morbidity.  The differential diagnosis includes kidney stone, pyelonephritis, pancreatitis, colitis, diverticulitis, appendicitis, gastritis, PUD, lower lobe pneumonia, MSK pain, shingle  Co morbidities that complicate the patient evaluation hx of ureteral reflux Additional history  obtained:  Additional history obtained from patient External records from outside source obtained and reviewed including previous hospital visits  Lab Tests:  I Ordered, and personally interpreted labs.  The pertinent results include:  as above  Imaging Studies ordered:  I ordered imaging studies including abd/pelvis CT I independently visualized and interpreted imaging which showed signs of pyelonephritis and small kidney stones I agree with the radiologist interpretation  Cardiac Monitoring:  The patient was maintained on a cardiac monitor.  I personally viewed and interpreted the cardiac monitored which showed an underlying rhythm of: NSR  Medicines ordered and prescription drug management:  I ordered medication including rocephin  for pyelonephritis Reevaluation of the patient after these medicines showed that the patient improved I have reviewed the patients home medicines and have made adjustments as needed  Test Considered: as above  Critical Interventions: IV opiate medication  IV antiemetic  IV abx   Problem List / ED Course: right flank pain  Reevaluation:  After the interventions noted above, I reevaluated the patient and found that they have :improved  Social Determinants of Health: tobacco use  Dispostion:  After consideration of the diagnostic results and the patients response to treatment, I feel that the patent would benefit from outpt f/u.         Final Clinical Impression(s) / ED Diagnoses Final diagnoses:  Pyelonephritis    Rx / DC Orders ED Discharge Orders          Ordered    cephALEXin (KEFLEX) 500 MG capsule  3 times daily        12/05/21 1329    ibuprofen (ADVIL) 600 MG tablet  Every 6 hours PRN        12/05/21 1329    ondansetron (ZOFRAN) 4 MG tablet  Every 8 hours PRN        12/05/21 1329              Domenic Moras, PA-C 12/05/21 1404    Blanchie Dessert, MD 12/05/21 1510

## 2021-12-07 LAB — URINE CULTURE: Culture: >=100000 — AB

## 2021-12-08 ENCOUNTER — Telehealth (HOSPITAL_BASED_OUTPATIENT_CLINIC_OR_DEPARTMENT_OTHER): Payer: Self-pay | Admitting: *Deleted

## 2021-12-08 NOTE — Telephone Encounter (Signed)
Post ED Visit - Positive Culture Follow-up  Culture report reviewed by antimicrobial stewardship pharmacist: [place] Team []  Elenor Quinones, Pharm.D. []  Heide Guile, Pharm.D., BCPS AQ-ID []  Parks Neptune, Pharm.D., BCPS []  Alycia Rossetti, Pharm.D., BCPS []  [place], Pharm.D., BCPS, AAHIVP []  Legrand Como, Pharm.D., BCPS, AAHIVP []  Salome Arnt, PharmD, BCPS []  Johnnette Gourd, PharmD, BCPS []  Hughes Better, PharmD, BCPS []  Leeroy Cha, PharmD []  Laqueta Linden, PharmD, BCPS [x]  Roderic Ovens, PharmD  [place] Team []  Leodis Sias, PharmD []  Lindell Spar, PharmD []  Royetta Asal, PharmD []  Graylin Shiver, Rph []  Rema Fendt) Glennon Mac, PharmD []  Arlyn Dunning, PharmD []  Netta Cedars, PharmD []  Dia Sitter, PharmD []  Leone Haven, PharmD []  Gretta Arab, PharmD []  Theodis Shove, PharmD []  Peggyann Juba, PharmD []  Reuel Boom, PharmD   Positive urine culture Treated with Cephalexin, organism sensitive to the same and no further patient follow-up is required at this time.  Rosie Fate 12/08/2021, 11:54 AM

## 2021-12-17 DIAGNOSIS — F411 Generalized anxiety disorder: Secondary | ICD-10-CM | POA: Diagnosis not present

## 2021-12-18 ENCOUNTER — Other Ambulatory Visit: Payer: Self-pay | Admitting: Urology

## 2021-12-18 ENCOUNTER — Other Ambulatory Visit (HOSPITAL_COMMUNITY): Payer: Self-pay | Admitting: Urology

## 2021-12-18 DIAGNOSIS — N1370 Vesicoureteral-reflux, unspecified: Secondary | ICD-10-CM

## 2021-12-25 ENCOUNTER — Encounter (HOSPITAL_COMMUNITY)
Admission: RE | Admit: 2021-12-25 | Discharge: 2021-12-25 | Disposition: A | Discharge status: Home / Self Care | Payer: Medicaid Other | Source: Ambulatory Visit | Attending: Urology | Admitting: Urology

## 2021-12-25 ENCOUNTER — Encounter (HOSPITAL_COMMUNITY): Admission: RE | Admit: 2021-12-25 | Payer: Medicaid Other | Source: Ambulatory Visit

## 2021-12-25 DIAGNOSIS — N270 Small kidney, unilateral: Secondary | ICD-10-CM | POA: Insufficient documentation

## 2021-12-25 DIAGNOSIS — N1370 Vesicoureteral-reflux, unspecified: Secondary | ICD-10-CM | POA: Diagnosis present

## 2021-12-25 MED ORDER — FUROSEMIDE 10 MG/ML IJ SOLN: 40.0000 mg | Freq: Once | INTRAMUSCULAR | Status: AC

## 2021-12-25 MED ORDER — FUROSEMIDE 10 MG/ML IJ SOLN
INTRAMUSCULAR | Status: AC
  Administered 2021-12-25: 40 mg via INTRAVENOUS
  Filled 2021-12-25: qty 4

## 2021-12-25 MED ORDER — TECHNETIUM TC 99M MERTIATIDE
5.0000 | Freq: Once | INTRAVENOUS | Status: AC | PRN
  Administered 2021-12-25: 5 via INTRAVENOUS

## 2021-12-27 DIAGNOSIS — R3914 Feeling of incomplete bladder emptying: Secondary | ICD-10-CM | POA: Diagnosis not present

## 2021-12-30 ENCOUNTER — Ambulatory Visit (HOSPITAL_COMMUNITY): Payer: Medicaid Other

## 2021-12-30 ENCOUNTER — Encounter (HOSPITAL_COMMUNITY)
Admission: RE | Admit: 2021-12-30 | Discharge: 2021-12-30 | Disposition: A | Discharge status: Home / Self Care | Payer: Medicaid Other | Source: Ambulatory Visit | Attending: Urology | Admitting: Urology

## 2021-12-30 DIAGNOSIS — N1370 Vesicoureteral-reflux, unspecified: Secondary | ICD-10-CM | POA: Diagnosis not present

## 2021-12-30 MED ORDER — SODIUM PERTECHNETATE TC 99M INJECTION
1.0000 | Freq: Once | INTRAVENOUS | Status: AC | PRN
  Administered 2021-12-30: 1 via INTRAVENOUS

## 2021-12-31 DIAGNOSIS — F411 Generalized anxiety disorder: Secondary | ICD-10-CM | POA: Diagnosis not present

## 2022-01-01 DIAGNOSIS — R350 Frequency of micturition: Secondary | ICD-10-CM | POA: Diagnosis not present

## 2022-01-28 DIAGNOSIS — F411 Generalized anxiety disorder: Secondary | ICD-10-CM | POA: Diagnosis not present

## 2022-02-17 DIAGNOSIS — R350 Frequency of micturition: Secondary | ICD-10-CM | POA: Diagnosis not present

## 2022-02-17 DIAGNOSIS — R1084 Generalized abdominal pain: Secondary | ICD-10-CM | POA: Diagnosis not present

## 2022-02-28 DIAGNOSIS — F411 Generalized anxiety disorder: Secondary | ICD-10-CM | POA: Diagnosis not present

## 2022-02-28 DIAGNOSIS — G4700 Insomnia, unspecified: Secondary | ICD-10-CM | POA: Diagnosis not present

## 2022-02-28 DIAGNOSIS — F330 Major depressive disorder, recurrent, mild: Secondary | ICD-10-CM | POA: Diagnosis not present

## 2022-02-28 DIAGNOSIS — F4312 Post-traumatic stress disorder, chronic: Secondary | ICD-10-CM | POA: Diagnosis not present

## 2022-03-10 ENCOUNTER — Other Ambulatory Visit: Payer: Self-pay

## 2022-03-10 ENCOUNTER — Encounter (HOSPITAL_COMMUNITY): Payer: Self-pay

## 2022-03-10 ENCOUNTER — Ambulatory Visit (HOSPITAL_COMMUNITY)
Admission: RE | Admit: 2022-03-10 | Discharge: 2022-03-10 | Disposition: A | Discharge status: Home / Self Care | Payer: Medicaid Other | Source: Ambulatory Visit | Attending: Emergency Medicine | Admitting: Emergency Medicine

## 2022-03-10 VITALS — BP 142/90 | HR 66 | Temp 98.6°F | Resp 18

## 2022-03-10 DIAGNOSIS — L237 Allergic contact dermatitis due to plants, except food: Secondary | ICD-10-CM | POA: Diagnosis not present

## 2022-03-10 MED ORDER — STERILE WATER FOR INJECTION IJ SOLN
INTRAMUSCULAR | Status: AC
  Filled 2022-03-10: qty 10

## 2022-03-10 MED ORDER — METHYLPREDNISOLONE SODIUM SUCC 125 MG IJ SOLR
INTRAMUSCULAR | Status: AC
  Filled 2022-03-10: qty 2

## 2022-03-10 MED ORDER — METHYLPREDNISOLONE SODIUM SUCC 125 MG IJ SOLR
60.0000 mg | Freq: Once | INTRAMUSCULAR | Status: AC
  Administered 2022-03-10: 60 mg via INTRAMUSCULAR

## 2022-03-10 MED ORDER — METHYLPREDNISOLONE 4 MG PO TBPK: ORAL_TABLET | ORAL | 0 refills | Status: DC

## 2022-03-10 NOTE — ED Triage Notes (Signed)
Pt reports out break of poison ivy after working in yard. Pt has areas on eyse,behind ears and arms.

## 2022-03-10 NOTE — Discharge Instructions (Addendum)
Today you are being treated for the poison ivy/oak rash  You have been given an injection of steroids today in the office today to help reduce the inflammatory process that occurs with this rash which will help minimize your itching as well as begin to clear  Starting tomorrow take methylprednisolone every morning with food as directed, to continue the above process  You may continue use of topical calamine or Benadryl cream to help manage itching, you may also continue oral Benadryl  Please avoid long exposures to heat such as a hot steamy shower or being outside as this may cause further irritation to your rash  You may follow-up with his urgent care as needed if symptoms persist or worsen   If you begin to have visual changes please notify your eye doctor

## 2022-03-10 NOTE — ED Provider Notes (Signed)
[place]    CSN: [id number] Arrival date & time: 03/10/22  1148      History   Chief Complaint Chief Complaint  Patient presents with   Poison Ivy    In my eye and other areas - Entered by patient    HPI Laurie Casey is a 36 y.o. female.   Patient presents with erythematous blistering pruritic rash present to the bilateral eyelids, chest and behind the ears beginning this morning.  Endorses that she was working in her yard and believes she was exposed to poison ivy as it is known to be present.  Has not attempted treatment of symptoms.  Denies visual changes.    Past Medical History:  Diagnosis Date   Anxiety    Asthma    with pregnancy   History of kidney stones    History of migraine    Pre-eclampsia     Patient Active Problem List   Diagnosis Date Noted   Calculus of ureter 11/29/2020    Past Surgical History:  Procedure Laterality Date   CESAREAN SECTION  2013, 2015, 2019   x2   CYSTOSCOPY  2008 or 2009   CYSTOSCOPY W/ URETERAL STENT PLACEMENT Right 11/29/2020   Procedure: CYSTOSCOPY WITH RETROGRADE PYELOGRAM/URETERAL STENT PLACEMENT;  Surgeon: Franchot Gallo, MD;  Location: [facility];  Service: Urology;  Laterality: Right;   CYSTOSCOPY/URETEROSCOPY/HOLMIUM LASER/STENT PLACEMENT Right 12/13/2020   Procedure: CYSTOSCOPY RIGHT URETEROSCOPY/HOLMIUM LASER/STENT EXTRACTION AND RIGHT JJ STENT PLACEMENT, RIGHT RETROGRADE URETEROSCOPY;  Surgeon: Franchot Gallo, MD;  Location: [facility];  Service: Urology;  Laterality: Right;   URETER SURGERY     x2   WISDOM TOOTH EXTRACTION      OB History   No obstetric history on file.      Home Medications    Prior to Admission medications   Medication Sig Start Date End Date Taking? Authorizing Provider  acetaminophen (TYLENOL) 500 MG tablet Take 1,000 mg by mouth every 6 (six) hours as needed (pain).    [provider]  buPROPion (WELLBUTRIN SR) 200 MG 12 hr tablet Take 200 mg by mouth 2 (two)  times daily. 09/18/21   [provider]  cephALEXin (KEFLEX) 500 MG capsule Take 1 capsule (500 mg total) by mouth 3 (three) times daily. 12/05/21   Domenic Moras, PA-C  gabapentin (NEURONTIN) 300 MG capsule Take 1 capsule (300 mg total) by mouth at bedtime. Patient not taking: Reported on 12/05/2021 03/04/21   Raspet, Derry Skill, PA-C  ibuprofen (ADVIL) 600 MG tablet Take 1 tablet (600 mg total) by mouth every 6 (six) hours as needed. 12/05/21   Domenic Moras, PA-C  Multiple Vitamin (MULTIVITAMIN) tablet Take 1 tablet by mouth daily.    [provider]  ondansetron (ZOFRAN) 4 MG tablet Take 1 tablet (4 mg total) by mouth every 8 (eight) hours as needed for nausea or vomiting. 12/05/21   Domenic Moras, PA-C  predniSONE (DELTASONE) 20 MG tablet 3 tabs daily x 3 days, 2 tabs daily x 3 days, 1 tab daily x 3 days,  1/2 tab daily x 3 days Patient not taking: Reported on 12/05/2021 11/07/21   Rondel Oh, MD  propranolol (INDERAL) 10 MG tablet Take 10 mg by mouth 2 (two) times daily as needed (anxiety). 12/04/21   [provider]  QUEtiapine (SEROQUEL) 25 MG tablet Take 25 mg by mouth at bedtime. 12/03/21   [provider]  sertraline (ZOLOFT) 100 MG tablet Take 50 mg by mouth daily. 11/07/21  [provider]  triamcinolone ointment (KENALOG) 0.5 % Apply 1 application. topically 2 (two) times daily. Patient taking differently: Apply 1 application. topically 2 (two) times daily as needed (itching). 11/07/21   Jannifer Franklin, MD    Family History History reviewed. No pertinent family history.  Social History Social History   Tobacco Use   Smoking status: Former    Packs/day: 0.25    Types: Cigarettes    Quit date: 10/12/2020    Years since quitting: 1.4   Smokeless tobacco: Never   Tobacco comments:    off and on   Vaping Use   Vaping Use: Never used  Substance Use Topics   Alcohol use: Not Currently   Drug use: Never     Allergies   Tape   Review of  Systems Review of Systems  Constitutional: Negative.   Eyes: Negative.   Respiratory: Negative.    Cardiovascular: Negative.   Skin:  Positive for rash. Negative for color change, pallor and wound.  Neurological: Negative.      Physical Exam Triage Vital Signs ED Triage Vitals  Enc Vitals Group     BP 03/10/22 1243 (!) 142/90     Pulse Rate 03/10/22 1243 66     Resp 03/10/22 1243 18     Temp 03/10/22 1243 98.6 F (37 C)     Temp src --      SpO2 03/10/22 1243 100 %     Weight --      Height --      Head Circumference --      Peak Flow --      Pain Score 03/10/22 1241 0     Pain Loc --      Pain Edu? --      Excl. in GC? --    No data found.  Updated Vital Signs BP (!) 142/90   Pulse 66   Temp 98.6 F (37 C)   Resp 18   LMP 02/24/2022   SpO2 100%   Visual Acuity Right Eye Distance:   Left Eye Distance:   Bilateral Distance:    Right Eye Near:   Left Eye Near:    Bilateral Near:     Physical Exam Constitutional:      Appearance: Normal appearance.  HENT:     Head: Normocephalic.  Eyes:     Extraocular Movements: Extraocular movements intact.  Pulmonary:     Effort: Pulmonary effort is normal.  Skin:    Comments: Erythematous papular blistering rash present to the bilateral eyelids, the bilateral ears and to the center of the chest wall, nondraining, nontender  Neurological:     Mental Status: She is alert and oriented to person, place, and time. Mental status is at baseline.  Psychiatric:        Mood and Affect: Mood normal.        Behavior: Behavior normal.      UC Treatments / Results  Labs (all labs ordered are listed, but only abnormal results are displayed) Labs Reviewed - No data to display  EKG   Radiology No results found.  Procedures Procedures (including critical care time)  Medications Ordered in UC Medications - No data to display  Initial Impression / Assessment and Plan / UC Course  I have reviewed the triage vital  signs and the nursing notes.  Pertinent labs & imaging results that were available during my care of the patient were reviewed by me and considered in my medical decision  making (see chart for details).  Poison ivy dermatitis  Presentation is consistent with dermatitis, discussed with patient, methylprednisolone injection given in office and methylprednisolone taper prescribed for outpatient use, may apply topical antihistamines or use oral antihistamines as well as calamine lotion for management of pruritus, advised against long exposure to heat to prevent further irritation and given strict precautions to follow-up with urgent care as needed if symptoms persist or worsen, at any point if patient begins to have visual changes she is to call her ophthalmologist for further evaluation and treatment Final Clinical Impressions(s) / UC Diagnoses   Final diagnoses:  None   Discharge Instructions   None    ED Prescriptions   None    PDMP not reviewed this encounter.   Valinda Hoar, NP 03/10/22 1309

## 2022-03-19 DIAGNOSIS — F32A Depression, unspecified: Secondary | ICD-10-CM | POA: Diagnosis not present

## 2022-03-19 DIAGNOSIS — F419 Anxiety disorder, unspecified: Secondary | ICD-10-CM | POA: Diagnosis not present

## 2022-03-24 DIAGNOSIS — R3914 Feeling of incomplete bladder emptying: Secondary | ICD-10-CM | POA: Diagnosis not present

## 2022-03-24 DIAGNOSIS — R1084 Generalized abdominal pain: Secondary | ICD-10-CM | POA: Diagnosis not present

## 2022-04-08 DIAGNOSIS — F32A Depression, unspecified: Secondary | ICD-10-CM | POA: Diagnosis not present

## 2022-04-08 DIAGNOSIS — F419 Anxiety disorder, unspecified: Secondary | ICD-10-CM | POA: Diagnosis not present

## 2022-04-14 ENCOUNTER — Other Ambulatory Visit: Payer: Self-pay

## 2022-04-14 ENCOUNTER — Emergency Department (HOSPITAL_COMMUNITY)
Admission: EM | Admit: 2022-04-14 | Discharge: 2022-04-14 | Discharge status: Against Medical Advice | Payer: Medicaid Other | Attending: Emergency Medicine | Admitting: Emergency Medicine

## 2022-04-14 ENCOUNTER — Encounter (HOSPITAL_COMMUNITY): Payer: Self-pay

## 2022-04-14 DIAGNOSIS — T7840XA Allergy, unspecified, initial encounter: Secondary | ICD-10-CM | POA: Insufficient documentation

## 2022-04-14 DIAGNOSIS — R0602 Shortness of breath: Secondary | ICD-10-CM | POA: Diagnosis not present

## 2022-04-14 DIAGNOSIS — Z5321 Procedure and treatment not carried out due to patient leaving prior to being seen by health care provider: Secondary | ICD-10-CM | POA: Diagnosis not present

## 2022-04-14 LAB — CBC WITH DIFFERENTIAL/PLATELET
Abs Immature Granulocytes: 0.04 10*3/uL (ref 0.00–0.07)
Basophils Absolute: 0 10*3/uL (ref 0.0–0.1)
Basophils Relative: 0 %
Eosinophils Absolute: 1 10*3/uL — ABNORMAL HIGH (ref 0.0–0.5)
Eosinophils Relative: 10 %
HCT: 42.8 % (ref 36.0–46.0)
Hemoglobin: 13.9 g/dL (ref 12.0–15.0)
Immature Granulocytes: 0 %
Lymphocytes Relative: 21 %
Lymphs Abs: 2 10*3/uL (ref 0.7–4.0)
MCH: 30.6 pg (ref 26.0–34.0)
MCHC: 32.5 g/dL (ref 30.0–36.0)
MCV: 94.3 fL (ref 80.0–100.0)
Monocytes Absolute: 1 10*3/uL (ref 0.1–1.0)
Monocytes Relative: 11 %
Neutro Abs: 5.6 10*3/uL (ref 1.7–7.7)
Neutrophils Relative %: 58 %
Platelets: 184 10*3/uL (ref 150–400)
RBC: 4.54 MIL/uL (ref 3.87–5.11)
RDW: 12.5 % (ref 11.5–15.5)
WBC: 9.7 10*3/uL (ref 4.0–10.5)
nRBC: 0 % (ref 0.0–0.2)

## 2022-04-14 LAB — BASIC METABOLIC PANEL
Anion gap: 10 (ref 5–15)
BUN: 9 mg/dL (ref 6–20)
CO2: 21 mmol/L — ABNORMAL LOW (ref 22–32)
Calcium: 9.7 mg/dL (ref 8.9–10.3)
Chloride: 108 mmol/L (ref 98–111)
Creatinine, Ser: 0.95 mg/dL (ref 0.44–1.00)
GFR, Estimated: >60 mL/min (ref >60)
Glucose, Bld: 101 mg/dL — ABNORMAL HIGH (ref 70–99)
Potassium: 4.1 mmol/L (ref 3.5–5.1)
Sodium: 139 mmol/L (ref 135–145)

## 2022-04-14 LAB — I-STAT BETA HCG BLOOD, ED (MC, WL, AP ONLY): I-stat hCG, quantitative: <5 m[IU]/mL (ref <5)

## 2022-04-14 NOTE — ED Notes (Signed)
Pt left ED and informed registration team that she left.

## 2022-04-14 NOTE — ED Provider Triage Note (Addendum)
Emergency Medicine Provider Triage Evaluation Note  Laurie Casey , a 36 y.o. female  was evaluated in triage.  Pt complains of allergic reaction.  Patient used a gel polish last night and began having a rash and shortness of breath.  Patient denies current shortness of breath.  No throat closing sensation.  Denies vomiting.  No tongue swelling.  Review of Systems  Positive: rash Negative: fever  Physical Exam  There were no vitals taken for this visit. Gen:   Awake, no distress   Resp:  Normal effort  MSK:   Moves extremities without difficulty  Other:  Rash, airway patent.  No stridor or wheeze.  Patient speaking in full sentences.  Tolerating oral secretions without difficulty  Medical Decision Making  Medically screening exam initiated at 10:13 AM.  Appropriate orders placed.  Laurie Casey was informed that the remainder of the evaluation will be completed by another provider, this initial triage assessment does not replace that evaluation, and the importance of remaining in the ED until their evaluation is complete.  Routine labs No evidence of anaphylactic reaction at this time.   Suzy Bouchard, PA-C 04/14/22 [place], [place], [place] 04/14/22 1015

## 2022-04-14 NOTE — ED Triage Notes (Signed)
Patient used a gel polish last night and this morning with rash and itching. Speaking with no trouble swallowing. Took anthistamine

## 2022-04-16 DIAGNOSIS — F32A Depression, unspecified: Secondary | ICD-10-CM | POA: Diagnosis not present

## 2022-04-16 DIAGNOSIS — F419 Anxiety disorder, unspecified: Secondary | ICD-10-CM | POA: Diagnosis not present

## 2022-04-30 DIAGNOSIS — F419 Anxiety disorder, unspecified: Secondary | ICD-10-CM | POA: Diagnosis not present

## 2022-04-30 DIAGNOSIS — F32A Depression, unspecified: Secondary | ICD-10-CM | POA: Diagnosis not present

## 2022-05-02 DIAGNOSIS — F32A Depression, unspecified: Secondary | ICD-10-CM | POA: Diagnosis not present

## 2022-05-02 DIAGNOSIS — F419 Anxiety disorder, unspecified: Secondary | ICD-10-CM | POA: Diagnosis not present

## 2022-05-07 DIAGNOSIS — F419 Anxiety disorder, unspecified: Secondary | ICD-10-CM | POA: Diagnosis not present

## 2022-05-07 DIAGNOSIS — F32A Depression, unspecified: Secondary | ICD-10-CM | POA: Diagnosis not present

## 2022-05-15 DIAGNOSIS — F32A Depression, unspecified: Secondary | ICD-10-CM | POA: Diagnosis not present

## 2022-05-15 DIAGNOSIS — F419 Anxiety disorder, unspecified: Secondary | ICD-10-CM | POA: Diagnosis not present

## 2022-05-21 DIAGNOSIS — F419 Anxiety disorder, unspecified: Secondary | ICD-10-CM | POA: Diagnosis not present

## 2022-05-21 DIAGNOSIS — F32A Depression, unspecified: Secondary | ICD-10-CM | POA: Diagnosis not present

## 2022-05-30 DIAGNOSIS — F419 Anxiety disorder, unspecified: Secondary | ICD-10-CM | POA: Diagnosis not present

## 2022-05-30 DIAGNOSIS — F32A Depression, unspecified: Secondary | ICD-10-CM | POA: Diagnosis not present

## 2022-06-18 DIAGNOSIS — F419 Anxiety disorder, unspecified: Secondary | ICD-10-CM | POA: Diagnosis not present

## 2022-06-18 DIAGNOSIS — F32A Depression, unspecified: Secondary | ICD-10-CM | POA: Diagnosis not present

## 2022-06-30 ENCOUNTER — Ambulatory Visit: Payer: Medicaid Other | Attending: Internal Medicine | Admitting: Internal Medicine

## 2022-06-30 ENCOUNTER — Encounter: Payer: Self-pay | Admitting: Internal Medicine

## 2022-06-30 VITALS — BP 130/90 | HR 108 | Temp 98.0°F | Ht 65.0 in | Wt 202.0 lb

## 2022-06-30 DIAGNOSIS — F411 Generalized anxiety disorder: Secondary | ICD-10-CM | POA: Diagnosis not present

## 2022-06-30 DIAGNOSIS — F320 Major depressive disorder, single episode, mild: Secondary | ICD-10-CM | POA: Diagnosis not present

## 2022-06-30 DIAGNOSIS — Z23 Encounter for immunization: Secondary | ICD-10-CM

## 2022-06-30 DIAGNOSIS — M2550 Pain in unspecified joint: Secondary | ICD-10-CM | POA: Insufficient documentation

## 2022-06-30 DIAGNOSIS — L309 Dermatitis, unspecified: Secondary | ICD-10-CM | POA: Insufficient documentation

## 2022-06-30 DIAGNOSIS — F4310 Post-traumatic stress disorder, unspecified: Secondary | ICD-10-CM | POA: Insufficient documentation

## 2022-06-30 DIAGNOSIS — R030 Elevated blood-pressure reading, without diagnosis of hypertension: Secondary | ICD-10-CM

## 2022-06-30 DIAGNOSIS — Z7689 Persons encountering health services in other specified circumstances: Secondary | ICD-10-CM

## 2022-06-30 HISTORY — DX: Major depressive disorder, single episode, mild: F32.0

## 2022-06-30 HISTORY — DX: Post-traumatic stress disorder, unspecified: F43.10

## 2022-06-30 HISTORY — DX: Dermatitis, unspecified: L30.9

## 2022-06-30 HISTORY — DX: Generalized anxiety disorder: F41.1

## 2022-06-30 NOTE — Progress Notes (Signed)
Patient ID: [name], female    DOB: 1986-03-19  MRN: [id number]  CC: Establish Care (Est care / new pt./Diagnosed with PTSD, anxiety & depression. Therapist recommended pt to discuss fibromyalgia. Valentino Hue to flu vax.)   Subjective: Laurie Casey is a 36 y.o. female who presents for new pt visit Her concerns today include:  Patient with history of asthma, former smoker, anxiety disorder, migraines, preeclampsia, ureteral stone,  Previous PCP was in [place].  She decided to change PCP because she relocated to [facility].  Hx of PTSD, anxiety ds, MDD.  Currently on Gabapentin 400 mg TID, Cymbalta 30 mg daily, Seroquel 25 mg QHS, Xanax 0.25 mg PRN and Wellbutrin SR 300mg  once a day Followed by psychiatrist , MD and therapist Nia at [facility]. Doing well on these meds.  Recent change from Zoloft to Cymbalta Psychiatrist recommends seeing a PCP because she has bad body pains.  Suspect fibromyalgia.  Patient reports "I am in pain a lot."  Pain is mainly in the upper back, hips, knees and arms.  She describes the pain as aching, squeezing, pins-and-needles and sometimes burning sensation.   Denies any swelling of the joint.  Endorses some stiffness particularly in her knees when she gets off work and gets home and sits for period of time then tries to get up.  Had seen neurologist and had EMG of the upper and lower extremities that was negative.  Had seen a pain specialist who had given some injections to her upper back which she felt helped for a time.  Had MRI of the cervical and thoracic spine a year ago.  The MRI of the  showed small disc bulge at T6-T7 without stenosis and very mild degenerative changes of the cervical spine.  Patient attributes flare of her pain when she is exposed to stressful situations like dealing with her ex-husband or her stepmother.  Reports having experienced a lot of psychological trauma throughout her life.  Zoloft was  recently changed to Cymbalta 30 mg daily by his psychiatrist less than a week ago to see if it would help with some of her chronic pain.  She was noted to have a rash on the dorsal surface of her fingers that looks like eczema.  Patient states that the rash comes and goes and is worse during stressful situations.  She works as an [employer].  She does not wear gloves at work.  Reports being tried with steroid cream in the past but it burns and is very uncomfortable for her.  Blood pressure noted to be elevated.  She has history of preeclampsia during the birth of her second daughter 8 years ago.  Other than that, no history of high blood pressure. Patient Active Problem List   Diagnosis Date Noted   Calculus of ureter 11/29/2020     Current Outpatient Medications on File Prior to Visit  Medication Sig Dispense Refill   ALPRAZolam (XANAX) 0.25 MG tablet Take 0.25 mg by mouth daily as needed.     buPROPion (WELLBUTRIN XL) 300 MG 24 hr tablet Take 300 mg by mouth every morning.     gabapentin (NEURONTIN) 400 MG capsule Take 400 mg by mouth 3 (three) times daily.     Multiple Vitamin (MULTIVITAMIN) tablet Take 1 tablet by mouth daily.     QUEtiapine (SEROQUEL) 25 MG tablet Take 25 mg by mouth at bedtime.     No current facility-administered medications on file prior to visit.  Allergies  Allergen Reactions   Tape Rash    Adhesive    Social History   Socioeconomic History   Marital status: Significant Other    Spouse name: Not on file   Number of children: 3   Years of education: Not on file   Highest education level: Not on file  Occupational History   Occupation: Vet tech  Tobacco Use   Smoking status: Former    Packs/day: 0.25    Types: Cigarettes    Quit date: 10/12/2020    Years since quitting: 1.7   Smokeless tobacco: Never   Tobacco comments:    off and on   Vaping Use   Vaping Use: Never used  Substance and Sexual Activity   Alcohol use: Not Currently   Drug  use: Never   Sexual activity: Not on file  Other Topics Concern   Not on file  Social History Narrative   Not on file   Social Determinants of Health   Financial Resource Strain: Not on file  Food Insecurity: Not on file  Transportation Needs: Not on file  Physical Activity: Not on file  Stress: Not on file  Social Connections: Not on file  Intimate Partner Violence: Not on file    History reviewed. No pertinent family history.  Past Surgical History:  Procedure Laterality Date   CESAREAN SECTION  2013, 2015, 2019   x2   CYSTOSCOPY  2008 or 2009   CYSTOSCOPY W/ URETERAL STENT PLACEMENT Right 11/29/2020   Procedure: CYSTOSCOPY WITH RETROGRADE PYELOGRAM/URETERAL STENT PLACEMENT;  Surgeon: Marcine Matar, MD;  Location: [facility] OR;  Service: Urology;  Laterality: Right;   CYSTOSCOPY/URETEROSCOPY/HOLMIUM LASER/STENT PLACEMENT Right 12/13/2020   Procedure: CYSTOSCOPY RIGHT URETEROSCOPY/HOLMIUM LASER/STENT EXTRACTION AND RIGHT JJ STENT PLACEMENT, RIGHT RETROGRADE URETEROSCOPY;  Surgeon: Marcine Matar, MD;  Location: [facility];  Service: Urology;  Laterality: Right;   URETER SURGERY     x2   WISDOM TOOTH EXTRACTION      ROS: Review of Systems Negative except as stated above  PHYSICAL EXAM: BP (!) 130/90   Pulse (!) 108   Temp 98 F (36.7 C) (Oral)   Ht 5\' 5"  (1.651 m)   Wt 202 lb (91.6 kg)   SpO2 99%   BMI 33.61 kg/m   Physical Exam  General appearance - alert, well appearing, and in no distress Mental status - normal mood, behavior, speech, dress, motor activity, and thought processes Neck - supple, no significant adenopathy Chest - clear to auscultation, no wheezes, rales or rhonchi, symmetric air entry Heart - normal rate, regular rhythm, normal S1, S2, no murmurs, rubs, clicks or gallops Musculoskeletal -patient has no sign of active tenosynovitis of the hands.  She has good range of motion of the wrist, elbows, shoulders, hips and knees.  No signs of swelling of  these joints. Skin: She has peeling eczematous type rash noted mainly on the dorsal distal surfaces of the fingers.    06/30/2022    3:09 PM  Depression screen PHQ 2/9  Decreased Interest 2  Down, Depressed, Hopeless 2  PHQ - 2 Score 4  Altered sleeping 0  Tired, decreased energy 1  Change in appetite 0  Feeling bad or failure about yourself  1  Trouble concentrating 1  Moving slowly or fidgety/restless 0  Suicidal thoughts 0  PHQ-9 Score 7      06/30/2022    3:09 PM  GAD 7 : Generalized Anxiety Score  Nervous, Anxious, on Edge 1  Control/stop worrying  2  Worry too much - different things 2  Trouble relaxing 1  Restless 1  Easily annoyed or irritable 2  Afraid - awful might happen 0  Total GAD 7 Score 9        06/30/2022    3:09 PM  Depression screen PHQ 2/9  Decreased Interest 2  Down, Depressed, Hopeless 2  PHQ - 2 Score 4  Altered sleeping 0  Tired, decreased energy 1  Change in appetite 0  Feeling bad or failure about yourself  1  Trouble concentrating 1  Moving slowly or fidgety/restless 0  Suicidal thoughts 0  PHQ-9 Score 7        Latest Ref Rng & Units 04/14/2022   10:18 AM 12/05/2021    8:51 AM 11/28/2020    2:22 PM  CMP  Glucose 70 - 99 mg/dL 740  88  814   BUN 6 - 20 mg/dL 9  15  9    Creatinine 0.44 - 1.00 mg/dL  4.81  8.56   Sodium 135 - 145 mmol/L 139  134  136   Potassium 3.5 - 5.1 mmol/L 4.1  3.9  3.5   Chloride 98 - 111 mmol/L 108  107  107   CO2 22 - 32 mmol/L 21  20  22    Calcium 8.9 - 10.3 mg/dL 9.7  9.1  9.1   Total Protein 6.5 - 8.1 g/dL  7.1  6.8   Total Bilirubin 0.3 - 1.2 mg/dL  0.8  0.7   Alkaline Phos 38 - 126 U/L  61  59   AST 15 - 41 U/L  23  21   ALT 0 - 44 U/L  18  16    Lipid Panel  No results found for: "CHOL", "TRIG", "HDL", "CHOLHDL", "VLDL", "LDLCALC", "LDLDIRECT"  CBC    Component Value Date/Time   WBC 9.7 04/14/2022 1018   RBC 4.54 04/14/2022 1018   HGB 13.9 04/14/2022 1018   HCT 42.8 04/14/2022 1018    PLT 184 04/14/2022 1018   MCV 94.3 04/14/2022 1018   MCH 30.6 04/14/2022 1018   MCHC 32.5 04/14/2022 1018   RDW 12.5 04/14/2022 1018   LYMPHSABS 2.0 04/14/2022 1018   MONOABS 1.0 04/14/2022 1018   EOSABS 1.0 (H) 04/14/2022 1018   BASOSABS 0.0 04/14/2022 1018    ASSESSMENT AND PLAN: 1. Establishing care with new doctor, encounter for   2. Polyarthralgia Exam and history do not  suggest an inflammatory arthritis.  She does not have deformity or crepitus of the joints to suggest severe osteoarthritis.  However she is overweight for height, so mild osteoarthritis can be a possibility.  Fibromyalgia also a possibility given previous workup that she has had.  I told her that I agree with her psychiatrist changing her to Cymbalta because it can kill 2 birds at 1 stone in controlling her depression/anxiety and also helping with chronic pain.  Discussed the importance of getting in at least 7 to 8 hours of sleep at night.  Encouraged her to move is much as she can.  Patient states she is constantly moving during the day and averages about 10,000 steps. We will bring her back in several weeks to see how she is doing on the Cymbalta.  3. Eczema, unspecified type I was going to prescribe some triamcinolone cream but patient states that steroid cream irritates the rash so I will refer her to dermatology. - Ambulatory referral to Dermatology  4. Elevated blood pressure reading in  office without diagnosis of hypertension DASH diet discussed and encouraged. If she has access to a blood pressure device, I recommend that she checks blood pressure at least twice a week and record the readings and bring to her next visit.  Advised that normal blood pressure is 120/80 or lower.  5. GAD (generalized anxiety disorder) 6.  Mild MDD 7. PTSD (post-traumatic stress disorder) She is plugged in with behavioral health services already.  8. Need for immunization against influenza - Flu Vaccine QUAD 67104mo+IM  (Fluarix, Fluzone & Alfiuria Quad PF)     Patient was given the opportunity to ask questions.  Patient verbalized understanding of the plan and was able to repeat key elements of the plan.   This documentation was completed using Paediatric nurseDragon voice recognition technology.  Any transcriptional errors are unintentional.  Orders Placed This Encounter  Procedures   Flu Vaccine QUAD 59104mo+IM (Fluarix, Fluzone & Alfiuria Quad PF)   Ambulatory referral to Dermatology     Requested Prescriptions    No prescriptions requested or ordered in this encounter    Return in about 6 weeks (around 08/11/2022).  Jonah Blueeborah Makiah Clauson, MD, FACP

## 2022-06-30 NOTE — Patient Instructions (Signed)

## 2022-07-01 DIAGNOSIS — F419 Anxiety disorder, unspecified: Secondary | ICD-10-CM | POA: Diagnosis not present

## 2022-07-01 DIAGNOSIS — F32A Depression, unspecified: Secondary | ICD-10-CM | POA: Diagnosis not present

## 2022-07-02 DIAGNOSIS — F32A Depression, unspecified: Secondary | ICD-10-CM | POA: Diagnosis not present

## 2022-07-02 DIAGNOSIS — F419 Anxiety disorder, unspecified: Secondary | ICD-10-CM | POA: Diagnosis not present

## 2022-07-09 DIAGNOSIS — F419 Anxiety disorder, unspecified: Secondary | ICD-10-CM | POA: Diagnosis not present

## 2022-07-09 DIAGNOSIS — F32A Depression, unspecified: Secondary | ICD-10-CM | POA: Diagnosis not present

## 2022-07-17 DIAGNOSIS — R3914 Feeling of incomplete bladder emptying: Secondary | ICD-10-CM | POA: Diagnosis not present

## 2022-07-17 DIAGNOSIS — N200 Calculus of kidney: Secondary | ICD-10-CM | POA: Diagnosis not present

## 2022-07-17 DIAGNOSIS — R1084 Generalized abdominal pain: Secondary | ICD-10-CM | POA: Diagnosis not present

## 2022-07-30 DIAGNOSIS — F419 Anxiety disorder, unspecified: Secondary | ICD-10-CM | POA: Diagnosis not present

## 2022-07-30 DIAGNOSIS — F32A Depression, unspecified: Secondary | ICD-10-CM | POA: Diagnosis not present

## 2022-07-31 DIAGNOSIS — R1084 Generalized abdominal pain: Secondary | ICD-10-CM | POA: Diagnosis not present

## 2022-08-04 DIAGNOSIS — F4389 Other reactions to severe stress: Secondary | ICD-10-CM | POA: Diagnosis not present

## 2022-08-04 DIAGNOSIS — F419 Anxiety disorder, unspecified: Secondary | ICD-10-CM | POA: Diagnosis not present

## 2022-08-04 DIAGNOSIS — F32A Depression, unspecified: Secondary | ICD-10-CM | POA: Diagnosis not present

## 2022-08-05 DIAGNOSIS — F32A Depression, unspecified: Secondary | ICD-10-CM | POA: Diagnosis not present

## 2022-08-05 DIAGNOSIS — F419 Anxiety disorder, unspecified: Secondary | ICD-10-CM | POA: Diagnosis not present

## 2022-08-06 DIAGNOSIS — F4389 Other reactions to severe stress: Secondary | ICD-10-CM | POA: Diagnosis not present

## 2022-08-11 ENCOUNTER — Encounter: Payer: Self-pay | Admitting: Internal Medicine

## 2022-08-11 ENCOUNTER — Ambulatory Visit: Payer: Medicaid Other | Attending: Internal Medicine | Admitting: Internal Medicine

## 2022-08-11 VITALS — BP 126/86 | HR 99 | Temp 98.5°F | Ht 65.0 in | Wt 195.0 lb

## 2022-08-11 DIAGNOSIS — F411 Generalized anxiety disorder: Secondary | ICD-10-CM | POA: Diagnosis not present

## 2022-08-11 DIAGNOSIS — M797 Fibromyalgia: Secondary | ICD-10-CM | POA: Diagnosis not present

## 2022-08-11 DIAGNOSIS — E66811 Obesity, class 1: Secondary | ICD-10-CM | POA: Insufficient documentation

## 2022-08-11 DIAGNOSIS — Z6379 Other stressful life events affecting family and household: Secondary | ICD-10-CM

## 2022-08-11 DIAGNOSIS — E669 Obesity, unspecified: Secondary | ICD-10-CM

## 2022-08-11 DIAGNOSIS — I10 Essential (primary) hypertension: Secondary | ICD-10-CM | POA: Diagnosis not present

## 2022-08-11 DIAGNOSIS — F320 Major depressive disorder, single episode, mild: Secondary | ICD-10-CM | POA: Diagnosis not present

## 2022-08-11 HISTORY — DX: Fibromyalgia: M79.7

## 2022-08-11 MED ORDER — AMLODIPINE BESYLATE 5 MG PO TABS: 5.0000 mg | ORAL_TABLET | Freq: Every day | ORAL | 3 refills | Status: DC

## 2022-08-11 NOTE — Progress Notes (Signed)
Patient ID: Laurie Casey, female    DOB: [birth year]  MRN: [id number]  CC: Follow-up (F/u. /Discuss issues with job, flare-ups, anxiety. Dallie Piles received flu vax.)   Subjective: Laurie Casey is a 37 y.o. female who presents for 6 weeks follow-up for evaluation of chronic pain and repeat blood pressure check.   Her concerns today include:  Patient with history of asthma, former smoker, anxiety disorder, migraines, preeclampsia, ureteral stone, PTSD/GAD/MDD (Followed by psychiatrist Laurie Lofts, MD and therapist Laurie Casey at [facility]).  06/30/2022: Patient reports "I am in pain a lot."  Pain is mainly in the upper back, hips, knees and arms.  She describes the pain as aching, squeezing, pins-and-needles and sometimes burning sensation.   Denies any swelling of the joint.  Endorses some stiffness particularly in her knees when she gets off work and gets home and sits for period of time then tries to get up.   Had seen neurologist and had EMG of the upper and lower extremities that was negative.  Had seen a pain specialist who had given some injections to her upper back which she felt helped for a time.  Had MRI of the cervical and thoracic spine a year ago.  The MRI of the  showed small disc bulge at T6-T7 without stenosis and very mild degenerative changes of the cervical spine.   Patient attributes flare of her pain when she is exposed to stressful situations like dealing with her ex-husband or her stepmother.  Reports having experienced a lot of psychological trauma throughout her life.  Zoloft was recently changed to Cymbalta 30 mg daily by his psychiatrist less than a week ago to see if it would help with some of her chronic pain.  I agreed with the use of Cymbalta.  Plan was to have her follow-up in 6 weeks with me to see how she did with the Cymbalta.  Patient's blood pressure was also noted to be elevated on last visit.  DASH diet was discussed.  She was  advised to check blood pressure at least twice a week and bring readings with her.  Today: Patient reports that she was pain-free after being on Cymbalta for 10 days and was doing well.  However she had a recent stressful event that has caused her anxiety and depression to flare a little.  Fired from her job this past Friday.  She was a Armed forces training and education officer.  She feels she was treated unfairly at the job and was given extra responsibilities without the proper training.  She feels she was pushed out because she was not a part of their click.  They made it out to seem like she walked off the job so now she is not eligible for unemployment.  She is stressed about this as she is a single mom of 3 young children.  She felt the job was not a good situation and was looking to change jobs.  She had a telephone interview last week and has been called for an in person interview which will occur later this week.  She is hopeful that she would get the position.  She tells me that she has a problem keeping her job and wonders whether she would qualify for partial disability. Since her pain has improved with the Cymbalta, she wonders whether she indeed has fibromyalgia.  She had been checking blood pressure on her job.  Range was 130-150/90s.  She has been working on trying to get her weight  down.  Lost 7 pounds since last visit with me.  States that she has been more careful with her eating habits and avoids eating until she is stuffed. Patient Active Problem List   Diagnosis Date Noted   Eczema 06/30/2022   Polyarthralgia 06/30/2022   GAD (generalized anxiety disorder) 06/30/2022   PTSD (post-traumatic stress disorder) 06/30/2022   Major depressive disorder, single episode, mild ([place]) 06/30/2022   Calculus of ureter 11/29/2020     Current Outpatient Medications on File Prior to Visit  Medication Sig Dispense Refill   ALPRAZolam (XANAX) 0.25 MG tablet Take 0.25 mg by mouth daily as needed.     buPROPion  (WELLBUTRIN XL) 300 MG 24 hr tablet Take 300 mg by mouth every morning.     DULoxetine (CYMBALTA) 30 MG capsule Take 30 mg by mouth daily. 1 cap once daily     gabapentin (NEURONTIN) 400 MG capsule Take 400 mg by mouth 3 (three) times daily.     Multiple Vitamin (MULTIVITAMIN) tablet Take 1 tablet by mouth daily.     QUEtiapine (SEROQUEL) 25 MG tablet Take 25 mg by mouth at bedtime.     tamsulosin (FLOMAX) 0.4 MG CAPS capsule Take 0.4 mg by mouth daily. 1 cap SID     No current facility-administered medications on file prior to visit.    Allergies  Allergen Reactions   Tape Rash    Adhesive    Social History   Socioeconomic History   Marital status: Significant Other    Spouse name: Not on file   Number of children: 3   Years of education: Not on file   Highest education level: Not on file  Occupational History   Occupation: Vet tech  Tobacco Use   Smoking status: Former    Packs/day: 0.25    Types: Cigarettes    Quit date: 10/12/2020    Years since quitting: 1.8   Smokeless tobacco: Never   Tobacco comments:    off and on   Vaping Use   Vaping Use: Never used  Substance and Sexual Activity   Alcohol use: Not Currently   Drug use: Never   Sexual activity: Not on file  Other Topics Concern   Not on file  Social History Narrative   Not on file   Social Determinants of Health   Financial Resource Strain: Not on file  Food Insecurity: Not on file  Transportation Needs: Not on file  Physical Activity: Not on file  Stress: Not on file  Social Connections: Not on file  Intimate Partner Violence: Not on file    No family history on file.  Past Surgical History:  Procedure Laterality Date   CESAREAN SECTION  2013, 2015, 2019   x2   CYSTOSCOPY  2008 or 2009   CYSTOSCOPY W/ URETERAL STENT PLACEMENT Right 11/29/2020   Procedure: CYSTOSCOPY WITH RETROGRADE PYELOGRAM/URETERAL STENT PLACEMENT;  Surgeon: Franchot Gallo, MD;  Location: [facility];  Service: Urology;   Laterality: Right;   CYSTOSCOPY/URETEROSCOPY/HOLMIUM LASER/STENT PLACEMENT Right 12/13/2020   Procedure: CYSTOSCOPY RIGHT URETEROSCOPY/HOLMIUM LASER/STENT EXTRACTION AND RIGHT JJ STENT PLACEMENT, RIGHT RETROGRADE URETEROSCOPY;  Surgeon: Franchot Gallo, MD;  Location: [facility];  Service: Urology;  Laterality: Right;   URETER SURGERY     x2   WISDOM TOOTH EXTRACTION      ROS: Review of Systems Negative except as stated above  PHYSICAL EXAM: BP 126/86 (BP Location: Left Arm, Patient Position: Sitting, Cuff Size: Normal)   Pulse 99   Temp 98.5 F (  36.9 C) (Oral)   Ht 5\' 5"  (1.651 m)   Wt 195 lb (88.5 kg)   SpO2 98%   BMI 32.45 kg/m   Wt Readings from Last 3 Encounters:  08/11/22 195 lb (88.5 kg)  06/30/22 202 lb (91.6 kg)  12/05/21 200 lb (90.7 kg)  Repeat BP 120/90  Physical Exam  General appearance - alert, well appearing, and in no distress Mental status -patient is tearful at times.      Latest Ref Rng & Units 04/14/2022   10:18 AM 12/05/2021    8:51 AM 11/28/2020    2:22 PM  CMP  Glucose 70 - 99 mg/dL 11/30/2020  88  751   BUN 6 - 20 mg/dL 9  15  9    Creatinine 0.44 - 1.00 mg/dL 700   1.74   Sodium 135 - 145 mmol/L 139  134  136   Potassium 3.5 - 5.1 mmol/L 4.1  3.9  3.5   Chloride 98 - 111 mmol/L 108  107  107   CO2 22 - 32 mmol/L 21  20  22    Calcium 8.9 - 10.3 mg/dL 9.7  9.1  9.1   Total Protein 6.5 - 8.1 g/dL  7.1  6.8   Total Bilirubin 0.3 - 1.2 mg/dL  0.8  0.7   Alkaline Phos 38 - 126 U/L  61  59   AST 15 - 41 U/L  23  21   ALT 0 - 44 U/L  18  16    Lipid Panel  No results found for: "CHOL", "TRIG", "HDL", "CHOLHDL", "VLDL", "LDLCALC", "LDLDIRECT"  CBC    Component Value Date/Time   WBC 9.7 04/14/2022 1018   RBC 4.54 04/14/2022 1018   HGB 13.9 04/14/2022 1018   HCT 42.8 04/14/2022 1018   PLT 184 04/14/2022 1018   MCV 94.3 04/14/2022 1018   MCH 30.6 04/14/2022 1018   MCHC 32.5 04/14/2022 1018   RDW 12.5 04/14/2022 1018   LYMPHSABS 2.0 04/14/2022  1018   MONOABS 1.0 04/14/2022 1018   EOSABS 1.0 (H) 04/14/2022 1018   BASOSABS 0.0 04/14/2022 1018    ASSESSMENT AND PLAN: 1. Stressful life event affecting family -I actively listened to patient.  At the end she was able to agree with some insights that her work environment was toxic and that it was probably better that she moved on.  However this does not take away the anxiety and stress that she may feel as she looks for another job.  In regards to her question about partial disability, I told her that with depression and anxiety she had been doing well on the medications from her psychiatrist until this recent bump in the road.  She will have bumps in the road along the way but as long as she is able to insightfully get back up and move on without the depression and anxiety becoming disabling, she probably does not need to be on disability at this time.  She agreed with me.  2. Major depressive disorder, single episode, mild ([facility]) 3. GAD (generalized anxiety disorder) Continue current medications as prescribed by her psychiatrist.  4. Essential hypertension Blood pressure has remained elevated per her report since last visit with me.  We agreed to start antihypertensive.  We will put her on Norvasc 5 mg daily.  Advised patient that blood pressure goal is 130/80 or lower. - amLODipine (NORVASC) 5 MG tablet; Take 1 tablet (5 mg total) by mouth daily.  Dispense: 30 tablet;  Refill: 3  5. Fibromyalgia Most likely fibromyalgia versus chronic pain associated with depression.  She has done well on Cymbalta and will continue that  6. Obesity (BMI 30.0-34.9) Commended her on weight loss so far.  Encouraged her to continue healthy eating habits and to try to move is much as she can.  Printed information given on healthy eating habits.     Patient was given the opportunity to ask questions.  Patient verbalized understanding of the plan and was able to repeat key elements of the plan.   This  documentation was completed using Radio producer.  Any transcriptional errors are unintentional.  No orders of the defined types were placed in this encounter.    Requested Prescriptions   Signed Prescriptions Disp Refills   amLODipine (NORVASC) 5 MG tablet 30 tablet 3    Sig: Take 1 tablet (5 mg total) by mouth daily.    Return in about 4 months (around 12/10/2022).  Karle Plumber, MD, FACP

## 2022-08-11 NOTE — Patient Instructions (Addendum)
Start amlodipine and 5 mg daily to help control the blood pressure.  Blood pressure goal is 130/80 or lower.  Continue to try to eat healthy.  Try to move is much as you can. Healthy Eating Following a healthy eating pattern may help you to achieve and maintain a healthy body weight, reduce the risk of chronic disease, and live a long and productive life. It is important to follow a healthy eating pattern at an appropriate calorie level for your body. Your nutritional needs should be met primarily through food by choosing a variety of nutrient-rich foods. What are tips for following this plan? Reading food labels Read labels and choose the following: Reduced or low sodium. Juices with 100% fruit juice. Foods with low saturated fats and high polyunsaturated and monounsaturated fats. Foods with whole grains, such as whole wheat, cracked wheat, brown rice, and wild rice. Whole grains that are fortified with folic acid. This is recommended for women who are pregnant or who want to become pregnant. Read labels and avoid the following: Foods with a lot of added sugars. These include foods that contain brown sugar, corn sweetener, corn syrup, dextrose, fructose, glucose, high-fructose corn syrup, honey, invert sugar, lactose, malt syrup, maltose, molasses, raw sugar, sucrose, trehalose, or turbinado sugar. Do not eat more than the following amounts of added sugar per day: 6 teaspoons (25 g) for women. 9 teaspoons (38 g) for men. Foods that contain processed or refined starches and grains. Refined grain products, such as white flour, degermed cornmeal, white bread, and white rice. Shopping Choose nutrient-rich snacks, such as vegetables, whole fruits, and nuts. Avoid high-calorie and high-sugar snacks, such as potato chips, fruit snacks, and candy. Use oil-based dressings and spreads on foods instead of solid fats such as butter, stick margarine, or cream cheese. Limit pre-made sauces, mixes, and  "instant" products such as flavored rice, instant noodles, and ready-made pasta. Try more plant-protein sources, such as tofu, tempeh, black beans, edamame, lentils, nuts, and seeds. Explore eating plans such as the Mediterranean diet or vegetarian diet. Cooking Use oil to saut or stir-fry foods instead of solid fats such as butter, stick margarine, or lard. Try baking, boiling, grilling, or broiling instead of frying. Remove the fatty part of meats before cooking. Steam vegetables in water or broth. Meal planning  At meals, imagine dividing your plate into fourths: One-half of your plate is fruits and vegetables. One-fourth of your plate is whole grains. One-fourth of your plate is protein, especially lean meats, poultry, eggs, tofu, beans, or nuts. Include low-fat dairy as part of your daily diet. Lifestyle Choose healthy options in all settings, including home, work, school, restaurants, or stores. Prepare your food safely: Wash your hands after handling raw meats. Keep food preparation surfaces clean by regularly washing with hot, soapy water. Keep raw meats separate from ready-to-eat foods, such as fruits and vegetables. Cook seafood, meat, poultry, and eggs to the recommended internal temperature. Store foods at safe temperatures. In general: Keep cold foods at 51F (4.4C) or below. Keep hot foods at 151F (60C) or above. Keep your freezer at [facility] (-17.8C) or below. Foods are no longer safe to eat when they have been between the temperatures of 40-151F (4.4-60C) for more than 2 hours. What foods should I eat? Fruits Aim to eat 2 cup-equivalents of fresh, canned (in natural juice), or frozen fruits each day. Examples of 1 cup-equivalent of fruit include 1 small apple, 8 large strawberries, 1 cup canned fruit,  cup dried fruit,  or 1 cup 100% juice. Vegetables Aim to eat 2-3 cup-equivalents of fresh and frozen vegetables each day, including different varieties and colors.  Examples of 1 cup-equivalent of vegetables include 2 medium carrots, 2 cups raw, leafy greens, 1 cup chopped vegetable (raw or cooked), or 1 medium baked potato. Grains Aim to eat 6 ounce-equivalents of whole grains each day. Examples of 1 ounce-equivalent of grains include 1 slice of bread, 1 cup ready-to-eat cereal, 3 cups popcorn, or  cup cooked rice, pasta, or cereal. Meats and other proteins Aim to eat 5-6 ounce-equivalents of protein each day. Examples of 1 ounce-equivalent of protein include 1 egg, 1/2 cup nuts or seeds, or 1 tablespoon (16 g) peanut butter. A cut of meat or fish that is the size of a deck of cards is about 3-4 ounce-equivalents. Of the protein you eat each week, try to have at least 8 ounces come from seafood. This includes salmon, trout, herring, and anchovies. Dairy Aim to eat 3 cup-equivalents of fat-free or low-fat dairy each day. Examples of 1 cup-equivalent of dairy include 1 cup (240 mL) milk, 8 ounces (250 g) yogurt, 1 ounces (44 g) natural cheese, or 1 cup (240 mL) fortified soy milk. Fats and oils Aim for about 5 teaspoons (21 g) per day. Choose monounsaturated fats, such as canola and olive oils, avocados, peanut butter, and most nuts, or polyunsaturated fats, such as sunflower, corn, and soybean oils, walnuts, pine nuts, sesame seeds, sunflower seeds, and flaxseed. Beverages Aim for six 8-oz glasses of water per day. Limit coffee to three to five 8-oz cups per day. Limit caffeinated beverages that have added calories, such as soda and energy drinks. Limit alcohol intake to no more than 1 drink a day for nonpregnant women and 2 drinks a day for men. One drink equals 12 oz of beer (355 mL), 5 oz of wine (148 mL), or 1 oz of hard liquor (44 mL). Seasoning and other foods Avoid adding excess amounts of salt to your foods. Try flavoring foods with herbs and spices instead of salt. Avoid adding sugar to foods. Try using oil-based dressings, sauces, and spreads  instead of solid fats. This information is based on general [place] nutrition guidelines. For more information, visit [employer]. Exact amounts may vary based on your nutrition needs. Summary A healthy eating plan may help you to maintain a healthy weight, reduce the risk of chronic diseases, and stay active throughout your life. Plan your meals. Make sure you eat the right portions of a variety of nutrient-rich foods. Try baking, boiling, grilling, or broiling instead of frying. Choose healthy options in all settings, including home, work, school, restaurants, or stores. This information is not intended to replace advice given to you by your health care provider. Make sure you discuss any questions you have with your health care provider. Document Revised: 12/11/2021 Document Reviewed: 02/26/2021 Elsevier Patient Education  [place].

## 2022-08-13 DIAGNOSIS — F32A Depression, unspecified: Secondary | ICD-10-CM | POA: Diagnosis not present

## 2022-08-13 DIAGNOSIS — F419 Anxiety disorder, unspecified: Secondary | ICD-10-CM | POA: Diagnosis not present

## 2022-08-18 DIAGNOSIS — L301 Dyshidrosis [pompholyx]: Secondary | ICD-10-CM | POA: Diagnosis not present

## 2022-08-19 ENCOUNTER — Other Ambulatory Visit: Payer: Self-pay | Admitting: Internal Medicine

## 2022-08-19 ENCOUNTER — Encounter: Payer: Self-pay | Admitting: Internal Medicine

## 2022-08-19 ENCOUNTER — Encounter: Payer: Self-pay | Admitting: General Practice

## 2022-08-19 MED ORDER — LABETALOL HCL 100 MG PO TABS: 50.0000 mg | ORAL_TABLET | Freq: Two times a day (BID) | ORAL | 3 refills | Status: DC

## 2022-08-20 ENCOUNTER — Other Ambulatory Visit: Payer: Self-pay

## 2022-08-20 ENCOUNTER — Emergency Department (HOSPITAL_BASED_OUTPATIENT_CLINIC_OR_DEPARTMENT_OTHER)
Admission: EM | Admit: 2022-08-20 | Discharge: 2022-08-20 | Disposition: A | Discharge status: Home / Self Care | Payer: Medicaid Other | Attending: Emergency Medicine | Admitting: Emergency Medicine

## 2022-08-20 ENCOUNTER — Emergency Department (HOSPITAL_BASED_OUTPATIENT_CLINIC_OR_DEPARTMENT_OTHER): Payer: Medicaid Other

## 2022-08-20 ENCOUNTER — Encounter (HOSPITAL_BASED_OUTPATIENT_CLINIC_OR_DEPARTMENT_OTHER): Payer: Self-pay | Admitting: Emergency Medicine

## 2022-08-20 DIAGNOSIS — R000 Tachycardia, unspecified: Secondary | ICD-10-CM | POA: Insufficient documentation

## 2022-08-20 DIAGNOSIS — Z3201 Encounter for pregnancy test, result positive: Secondary | ICD-10-CM

## 2022-08-20 DIAGNOSIS — R1031 Right lower quadrant pain: Secondary | ICD-10-CM | POA: Diagnosis not present

## 2022-08-20 DIAGNOSIS — R109 Unspecified abdominal pain: Secondary | ICD-10-CM | POA: Diagnosis not present

## 2022-08-20 DIAGNOSIS — O26891 Other specified pregnancy related conditions, first trimester: Secondary | ICD-10-CM | POA: Diagnosis present

## 2022-08-20 DIAGNOSIS — U071 COVID-19: Secondary | ICD-10-CM

## 2022-08-20 DIAGNOSIS — O98511 Other viral diseases complicating pregnancy, first trimester: Secondary | ICD-10-CM | POA: Insufficient documentation

## 2022-08-20 DIAGNOSIS — Z3A01 Less than 8 weeks gestation of pregnancy: Secondary | ICD-10-CM | POA: Diagnosis not present

## 2022-08-20 DIAGNOSIS — Z79899 Other long term (current) drug therapy: Secondary | ICD-10-CM | POA: Diagnosis not present

## 2022-08-20 DIAGNOSIS — O26899 Other specified pregnancy related conditions, unspecified trimester: Secondary | ICD-10-CM | POA: Diagnosis not present

## 2022-08-20 DIAGNOSIS — Z3A00 Weeks of gestation of pregnancy not specified: Secondary | ICD-10-CM | POA: Diagnosis not present

## 2022-08-20 LAB — CBC WITH DIFFERENTIAL/PLATELET
Abs Immature Granulocytes: 0.05 10*3/uL (ref 0.00–0.07)
Basophils Absolute: 0 10*3/uL (ref 0.0–0.1)
Basophils Relative: 1 %
Eosinophils Absolute: 0.1 10*3/uL (ref 0.0–0.5)
Eosinophils Relative: 1 %
HCT: 37.5 % (ref 36.0–46.0)
Hemoglobin: 12.6 g/dL (ref 12.0–15.0)
Immature Granulocytes: 1 %
Lymphocytes Relative: 9 %
Lymphs Abs: 0.5 10*3/uL — ABNORMAL LOW (ref 0.7–4.0)
MCH: 31 pg (ref 26.0–34.0)
MCHC: 33.6 g/dL (ref 30.0–36.0)
MCV: 92.1 fL (ref 80.0–100.0)
Monocytes Absolute: 1.3 10*3/uL — ABNORMAL HIGH (ref 0.1–1.0)
Monocytes Relative: 25 %
Neutro Abs: 3.5 10*3/uL (ref 1.7–7.7)
Neutrophils Relative %: 63 %
Platelets: 145 10*3/uL — ABNORMAL LOW (ref 150–400)
RBC: 4.07 MIL/uL (ref 3.87–5.11)
RDW: 12.6 % (ref 11.5–15.5)
WBC: 5.4 10*3/uL (ref 4.0–10.5)
nRBC: 0 % (ref 0.0–0.2)

## 2022-08-20 LAB — COMPREHENSIVE METABOLIC PANEL
ALT: 27 U/L (ref 0–44)
AST: 29 U/L (ref 15–41)
Albumin: 4.4 g/dL (ref 3.5–5.0)
Alkaline Phosphatase: 57 U/L (ref 38–126)
Anion gap: 9 (ref 5–15)
BUN: 11 mg/dL (ref 6–20)
CO2: 23 mmol/L (ref 22–32)
Calcium: 9.2 mg/dL (ref 8.9–10.3)
Chloride: 102 mmol/L (ref 98–111)
Creatinine, Ser: 1.03 mg/dL — ABNORMAL HIGH (ref 0.44–1.00)
GFR, Estimated: >60 mL/min (ref >60)
Glucose, Bld: 99 mg/dL (ref 70–99)
Potassium: 3.7 mmol/L (ref 3.5–5.1)
Sodium: 134 mmol/L — ABNORMAL LOW (ref 135–145)
Total Bilirubin: 0.3 mg/dL (ref 0.3–1.2)
Total Protein: 6.8 g/dL (ref 6.5–8.1)

## 2022-08-20 LAB — RESP PANEL BY RT-PCR (RSV, FLU A&B, COVID)  RVPGX2
Influenza A by PCR: NEGATIVE
Influenza B by PCR: NEGATIVE
Resp Syncytial Virus by PCR: NEGATIVE
SARS Coronavirus 2 by RT PCR: POSITIVE — AB

## 2022-08-20 LAB — URINALYSIS, ROUTINE W REFLEX MICROSCOPIC
Bilirubin Urine: NEGATIVE
Glucose, UA: NEGATIVE mg/dL
Hgb urine dipstick: NEGATIVE
Ketones, ur: NEGATIVE mg/dL
Leukocytes,Ua: NEGATIVE
Nitrite: NEGATIVE
Specific Gravity, Urine: 1.028 (ref 1.005–1.030)
pH: 5.5 (ref 5.0–8.0)

## 2022-08-20 LAB — PREGNANCY, URINE: Preg Test, Ur: POSITIVE — AB

## 2022-08-20 LAB — LIPASE, BLOOD: Lipase: 15 U/L (ref 11–51)

## 2022-08-20 LAB — HCG, QUANTITATIVE, PREGNANCY: hCG, Beta Chain, Quant, S: 257 m[IU]/mL — ABNORMAL HIGH (ref <5)

## 2022-08-20 MED ORDER — PROMETHAZINE HCL 25 MG/ML IJ SOLN
INTRAMUSCULAR | Status: AC
  Filled 2022-08-20: qty 1

## 2022-08-20 MED ORDER — SODIUM CHLORIDE 0.9 % IV SOLN
12.5000 mg | Freq: Once | INTRAVENOUS | Status: AC
  Administered 2022-08-20: 12.5 mg via INTRAVENOUS
  Filled 2022-08-20: qty 0.5

## 2022-08-20 MED ORDER — ACETAMINOPHEN 325 MG PO TABS
650.0000 mg | ORAL_TABLET | Freq: Once | ORAL | Status: AC
  Administered 2022-08-20: 650 mg via ORAL
  Filled 2022-08-20: qty 2

## 2022-08-20 MED ORDER — SODIUM CHLORIDE 0.9 % IV BOLUS
1000.0000 mL | Freq: Once | INTRAVENOUS | Status: AC
  Administered 2022-08-20: 1000 mL via INTRAVENOUS

## 2022-08-20 NOTE — ED Triage Notes (Signed)
[redacted] weeks pregnant, c/o R flank pain, body aches, nasal congestion since yesterday. Denies urinary symptoms.

## 2022-08-20 NOTE — ED Notes (Signed)
Patient drinking water without difficulty.

## 2022-08-20 NOTE — ED Provider Notes (Signed)
[facility] Provider Note   CSN: [id number] Arrival date & time: 08/20/22  3220     History  Chief Complaint  Patient presents with   bodyaches   Flank Pain    Laurie Casey is a 37 y.o. female.  Patient presents with a 1 day history of body aches, chills, nasal congestion.  No fever.  No cough.  Has had nausea but no vomiting.  No travel or sick contacts.  No chest pain or shortness of breath.  Describes diffuse headache, body aches, chills, intermittent stabbing right flank pain that comes and goes.  No pain with urination or blood in the urine.  No chest pain or shortness of breath.  Believes she is [redacted] weeks pregnant by home pregnancy test.  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  Denies any vaginal bleeding or discharge.  Has not had an ultrasound this pregnancy.  Denies any significant abdominal pain but has intermittent sharp stabbing right flank pain similar to previous kidney stones.  Does not think she has had a fever at home.  Still has appendix and gallbladder.  No diarrhea. History of fibromyalgia, PTSD and anxiety and kidney stones.  The history is provided by the patient.  Flank Pain Associated symptoms include headaches. Pertinent negatives include no chest pain, no abdominal pain and no shortness of breath.       Home Medications Prior to Admission medications   Medication Sig Start Date End Date Taking? Authorizing Provider  labetalol (NORMODYNE) 100 MG tablet Take 0.5 tablets (50 mg total) by mouth 2 (two) times daily. 08/19/22   Ladell Pier, MD  ALPRAZolam Duanne Moron) 0.25 MG tablet Take 0.25 mg by mouth daily as needed. 04/30/22   [provider]  buPROPion (WELLBUTRIN XL) 300 MG 24 hr tablet Take 300 mg by mouth every morning. 05/25/22   [provider]  DULoxetine (CYMBALTA) 30 MG capsule Take 30 mg by mouth daily. 1 cap once daily    [provider]   gabapentin (NEURONTIN) 400 MG capsule Take 400 mg by mouth 3 (three) times daily. 06/29/22   [provider]  Multiple Vitamin (MULTIVITAMIN) tablet Take 1 tablet by mouth daily.    [provider]  QUEtiapine (SEROQUEL) 25 MG tablet Take 25 mg by mouth at bedtime. 12/03/21   [provider]  tamsulosin (FLOMAX) 0.4 MG CAPS capsule Take 0.4 mg by mouth daily. 1 cap SID 07/18/22   [provider]      Allergies    Tape    Review of Systems   Review of Systems  Constitutional:  Positive for chills and fatigue. Negative for activity change, appetite change and fever.  HENT:  Positive for congestion and rhinorrhea. Negative for sore throat.   Respiratory:  Negative for cough and shortness of breath.   Cardiovascular:  Negative for chest pain.  Gastrointestinal:  Positive for nausea. Negative for abdominal pain and vomiting.  Genitourinary:  Positive for flank pain.  Musculoskeletal:  Positive for arthralgias, back pain and myalgias. Negative for neck pain.  Neurological:  Positive for weakness and headaches. Negative for dizziness and light-headedness.   all other systems are negative except as noted in the HPI and PMH.    Physical Exam Updated Vital Signs BP 126/85 (BP Location: Right Arm)   Pulse (!) 127   Temp 98.5 F (36.9 C)   Resp 18   Ht 5\' 5"  (1.651  m)   Wt 88.5 kg   LMP 02/24/2022   SpO2 100%   BMI 32.45 kg/m  Physical Exam Vitals and nursing note reviewed.  Constitutional:      General: She is not in acute distress.    Appearance: She is well-developed. She is not ill-appearing.  HENT:     Head: Normocephalic and atraumatic.     Nose: Congestion present.     Mouth/Throat:     Pharynx: No oropharyngeal exudate.  Eyes:     Conjunctiva/sclera: Conjunctivae normal.     Pupils: Pupils are equal, round, and reactive to light.  Neck:     Comments: No meningismus. Cardiovascular:     Rate and Rhythm: Regular rhythm. Tachycardia  present.     Heart sounds: Normal heart sounds. No murmur heard. Pulmonary:     Effort: Pulmonary effort is normal. No respiratory distress.     Breath sounds: Normal breath sounds.  Chest:     Chest wall: No tenderness.  Abdominal:     Palpations: Abdomen is soft.     Tenderness: There is no abdominal tenderness. There is no guarding or rebound.  Musculoskeletal:        General: Tenderness present. Normal range of motion.     Cervical back: Normal range of motion and neck supple.     Comments: R paraspinal lumbar tenderness  Skin:    General: Skin is warm.  Neurological:     Mental Status: She is alert and oriented to person, place, and time.     Cranial Nerves: No cranial nerve deficit.     Motor: No abnormal muscle tone.     Coordination: Coordination normal.     Comments:  5/5 strength throughout. CN 2-12 intact.Equal grip strength.   Psychiatric:        Behavior: Behavior normal.     ED Results / Procedures / Treatments   Labs (all labs ordered are listed, but only abnormal results are displayed) Labs Reviewed  RESP PANEL BY RT-PCR (RSV, FLU A&B, COVID)  RVPGX2 - Abnormal; Notable for the following components:      Result Value   SARS Coronavirus 2 by RT PCR POSITIVE (*)    All other components within normal limits  URINALYSIS, ROUTINE W REFLEX MICROSCOPIC - Abnormal; Notable for the following components:   Protein, ur TRACE (*)    All other components within normal limits  PREGNANCY, URINE - Abnormal; Notable for the following components:   Preg Test, Ur POSITIVE (*)    All other components within normal limits  CBC WITH DIFFERENTIAL/PLATELET - Abnormal; Notable for the following components:   Platelets 145 (*)    Lymphs Abs 0.5 (*)    Monocytes Absolute 1.3 (*)    All other components within normal limits  COMPREHENSIVE METABOLIC PANEL - Abnormal; Notable for the following components:   Sodium 134 (*)    Creatinine, Ser 1.03 (*)    All other components within  normal limits  HCG, QUANTITATIVE, PREGNANCY - Abnormal; Notable for the following components:   hCG, Beta Chain, Quant, S 257 (*)    All other components within normal limits  LIPASE, BLOOD    EKG None  Radiology US Renal  Addendum Date: 08/20/2022   ADDENDUM REPORT: 08/20/2022 11:53 ADDENDUM: There was a dictation error. The impression should read "No definite renal abnormality seen. " These results were discussed by telephone at the time of interpretation on 08/20/2022 at 11:53 am with provider [facility] . Electronically Signed  By: Lupita Raider M.D.   On: 08/20/2022 11:53   Result Date: 08/20/2022 CLINICAL DATA:  Acute right flank pain. EXAM: RENAL / URINARY TRACT ULTRASOUND COMPLETE COMPARISON:  Dec 05, 2021. FINDINGS: Right Kidney: Renal measurements: 9.0 x 4.2 x 4.1 cm = volume: 88 mL. Echogenicity within normal limits. No mass or hydronephrosis visualized. Left Kidney: Renal measurements: 12.9 x 6.0 x 6.0 cm = volume: 241 mL. Echogenicity within normal limits. No mass or hydronephrosis visualized. Bladder: Appears normal for degree of bladder distention. Other: None. Electronically Signed: By: Lupita Raider M.D. On: 08/20/2022 11:01   US OB LESS THAN 14 WEEKS WITH OB TRANSVAGINAL  Result Date: 08/20/2022 CLINICAL DATA:  One day history of right flank pain EXAM: OBSTETRIC <14 WK [place] AND TRANSVAGINAL OB US TECHNIQUE: Both transabdominal and transvaginal ultrasound examinations were performed for complete evaluation of the gestation as well as the maternal uterus, adnexal regions, and pelvic cul-de-sac. Transvaginal technique was performed to assess early pregnancy. COMPARISON:  None Available. FINDINGS: Intrauterine gestational sac: None Yolk sac:  Not Visualized. Embryo:  Not Visualized. Cardiac Activity: Not Visualized. Heart Rate: NA MSD: N/A CRL: N/A Subchorionic hemorrhage:  N/A Maternal uterus/adnexae: Uterus measures 8.6 cm in sagittal dimension. Postsurgical changes from prior  cesarean section. Endometrium measures 10 mm. Normal ovaries. Corpus luteum within the left ovary. Small volume free fluid. IMPRESSION: Pregnancy of unknown location. Differential may include early intrauterine pregnancy, nonvisualized ectopic pregnancy, or completed spontaneous abortion. Electronically Signed   By: Agustin Cree M.D.   On: 08/20/2022 10:05    Procedures Procedures    Medications Ordered in ED Medications  acetaminophen (TYLENOL) tablet 650 mg (has no administration in time range)  sodium chloride 0.9 % bolus 1,000 mL (has no administration in time range)  promethazine (PHENERGAN) 12.5 mg in sodium chloride 0.9 % 50 mL IVPB (has no administration in time range)    ED Course/ Medical Decision Making/ A&P                             Medical Decision Making Amount and/or Complexity of Data Reviewed Labs: ordered. Decision-making details documented in ED Course. Radiology: ordered and independent interpretation performed. Decision-making details documented in ED Course. ECG/medicine tests: ordered and independent interpretation performed. Decision-making details documented in ED Course.  Risk OTC drugs.  1 day of body aches, chills, fatigue, nasal congestion.  Intermittent flank pain with history of kidney stones, no urinary symptoms.  Believes she is [redacted] weeks pregnant.  Tachycardic but stable vital signs.  No hypoxia or increased work of breathing.  HCG is positive. UA negative for infection.  COVID test is positive.  This likely explains her body aches and nasal congestion and chills.  No hypoxia or increased work of breathing. hCG is 257.  Likely too early to show IUP on ultrasound but given her flank pain will obtain ultrasound to assess for possible IUP or kidney stone and attempt to rule out ectopic pregnancy.  Risk and benefits of Paxlovid discussed with the patient.  She prefers to defer given her pregnancy positive state.  She is also on many psychiatric  medications and has an appoint with her psychiatrist tomorrow to discuss what is safe to take in pregnancy or not. She declines Paxlovid at this time.  Ultrasound today shows pregnancy of undetermined location.  No IUP seen.  Consider early intrauterine pregnancy, possible ectopic pregnancy.  D/w Dr. Ashok Pall of obstetrics.  Patient has been seen by [facility] in the past but plans to follow-up with Dr. Nehemiah Settle in [facility] regarding this pregnancy. He recommends repeat hCG in 2 days.  He has arranged for HCG testing at [facility] on 2/9 at 9:25  Results discussed with patient.  She is comfortable going home.  Heart rate has normalized.  She has no hypoxia or increased work of breathing.  She declines Paxlovid.  She is aware of follow-up appointment in 2 days to check hCG levels for her pregnancy of undetermined location.  Discussed that ectopic pregnancy is possible as well as normal pregnancy as possible.  Return to the ED sooner with chest pain, shortness of breath, abdominal pain, vaginal bleeding or any other concerns.        Final Clinical Impression(s) / ED Diagnoses Final diagnoses:  COVID-19  Positive pregnancy test    Rx / DC Orders ED Discharge Orders     None         Kleo Dungee, Annie Main, MD 08/20/22 1225

## 2022-08-20 NOTE — ED Notes (Signed)
RN provided AVS using Teachback Method. Patient verbalizes understanding of Discharge Instructions. Opportunity for Questioning and Answers were provided by RN. Patient Discharged from ED ambulatory to Home. ° °

## 2022-08-20 NOTE — Discharge Instructions (Addendum)
Your COVID test is positive.  This likely is the source of your body aches and chills and fever.  Your pregnancy test is positive but no pregnancy is seen on ultrasound.  It is likely too early to be seen but ectopic pregnancy is still possible.  You are scheduled for a follow-up appointment for a blood draw in 2 days at [facility] for women and [place] on February 9 at 9:25 AM. Return to the ED if you develop new or worsening symptoms.  Quarantine yourself for a total of 5 days. Use tylenol as needed for aches and fever. Keep yourself hydrated.  You declined Paxlovid today.  Follow-up with your doctor regarding her medications to see what is safe to take during pregnancy as well as get alternative medications.  If you have any difficulty breathing, chest pain, not able to eat or drink return to the ED sooner.

## 2022-08-21 DIAGNOSIS — F32A Depression, unspecified: Secondary | ICD-10-CM | POA: Diagnosis not present

## 2022-08-21 DIAGNOSIS — F419 Anxiety disorder, unspecified: Secondary | ICD-10-CM | POA: Diagnosis not present

## 2022-08-22 ENCOUNTER — Other Ambulatory Visit: Payer: Medicaid Other

## 2022-08-22 DIAGNOSIS — O3680X0 Pregnancy with inconclusive fetal viability, not applicable or unspecified: Secondary | ICD-10-CM

## 2022-08-22 LAB — BETA HCG QUANT (REF LAB): hCG Quant: 881 m[IU]/mL

## 2022-08-22 NOTE — Progress Notes (Signed)
Patient sent to lab for STAT hcg follow up. Kathrene Alu RN

## 2022-08-23 ENCOUNTER — Encounter: Payer: Self-pay | Admitting: Family Medicine

## 2022-08-25 ENCOUNTER — Telehealth: Payer: Self-pay

## 2022-08-25 NOTE — Telephone Encounter (Signed)
Patient called and left message that labs appropiate and she should just keep her New oB appointment with [place]. Kathrene Alu RN

## 2022-08-26 ENCOUNTER — Ambulatory Visit: Payer: Medicaid Other

## 2022-08-30 DIAGNOSIS — F4389 Other reactions to severe stress: Secondary | ICD-10-CM | POA: Diagnosis not present

## 2022-09-01 ENCOUNTER — Encounter: Payer: Self-pay | Admitting: Family Medicine

## 2022-09-15 DIAGNOSIS — F419 Anxiety disorder, unspecified: Secondary | ICD-10-CM | POA: Diagnosis not present

## 2022-09-15 DIAGNOSIS — F32A Depression, unspecified: Secondary | ICD-10-CM | POA: Diagnosis not present

## 2022-09-15 DIAGNOSIS — F4389 Other reactions to severe stress: Secondary | ICD-10-CM | POA: Diagnosis not present

## 2022-10-01 ENCOUNTER — Encounter: Payer: Self-pay | Admitting: Family Medicine

## 2022-10-01 ENCOUNTER — Ambulatory Visit (INDEPENDENT_AMBULATORY_CARE_PROVIDER_SITE_OTHER): Payer: Medicaid Other | Admitting: Family Medicine

## 2022-10-01 ENCOUNTER — Other Ambulatory Visit (INDEPENDENT_AMBULATORY_CARE_PROVIDER_SITE_OTHER): Payer: Medicaid Other

## 2022-10-01 ENCOUNTER — Other Ambulatory Visit (HOSPITAL_COMMUNITY)
Admission: RE | Admit: 2022-10-01 | Discharge: 2022-10-01 | Disposition: A | Discharge status: Home / Self Care | Payer: Medicaid Other | Source: Ambulatory Visit | Attending: Family Medicine | Admitting: Family Medicine

## 2022-10-01 ENCOUNTER — Encounter: Payer: Self-pay | Admitting: General Practice

## 2022-10-01 VITALS — BP 116/68 | HR 84 | Wt 202.0 lb

## 2022-10-01 DIAGNOSIS — M797 Fibromyalgia: Secondary | ICD-10-CM

## 2022-10-01 DIAGNOSIS — O0990 Supervision of high risk pregnancy, unspecified, unspecified trimester: Secondary | ICD-10-CM | POA: Insufficient documentation

## 2022-10-01 DIAGNOSIS — Z3A10 10 weeks gestation of pregnancy: Secondary | ICD-10-CM | POA: Diagnosis not present

## 2022-10-01 DIAGNOSIS — O10011 Pre-existing essential hypertension complicating pregnancy, first trimester: Secondary | ICD-10-CM

## 2022-10-01 DIAGNOSIS — Z3481 Encounter for supervision of other normal pregnancy, first trimester: Secondary | ICD-10-CM

## 2022-10-01 DIAGNOSIS — Z98891 History of uterine scar from previous surgery: Secondary | ICD-10-CM | POA: Diagnosis not present

## 2022-10-01 DIAGNOSIS — O09899 Supervision of other high risk pregnancies, unspecified trimester: Secondary | ICD-10-CM | POA: Diagnosis not present

## 2022-10-01 DIAGNOSIS — I10 Essential (primary) hypertension: Secondary | ICD-10-CM

## 2022-10-01 DIAGNOSIS — O34211 Maternal care for low transverse scar from previous cesarean delivery: Secondary | ICD-10-CM

## 2022-10-01 DIAGNOSIS — O3680X0 Pregnancy with inconclusive fetal viability, not applicable or unspecified: Secondary | ICD-10-CM

## 2022-10-01 DIAGNOSIS — Z1339 Encounter for screening examination for other mental health and behavioral disorders: Secondary | ICD-10-CM

## 2022-10-01 DIAGNOSIS — E669 Obesity, unspecified: Secondary | ICD-10-CM

## 2022-10-01 LAB — POCT URINALYSIS DIPSTICK OB
Bilirubin, UA: NEGATIVE
Glucose, UA: NEGATIVE
Ketones, UA: NEGATIVE
Leukocytes, UA: NEGATIVE
Nitrite, UA: NEGATIVE
Spec Grav, UA: 1.015 (ref 1.010–1.025)
Urobilinogen, UA: 0.2 E.U./dL
pH, UA: 7 (ref 5.0–8.0)

## 2022-10-01 NOTE — Progress Notes (Signed)
Subjective:  Laurie Casey is a [id number] [redacted]w[redacted]d by LMP c/w [place] today. She is being seen today for her first obstetrical visit.  Her obstetrical history is significant for  previous three previous cesarean sections, hypertension on labetalol, fibromyalgia, GAD/depression . Patient does intend to breast feed. Pregnancy history fully reviewed.  Patient reports nausea.  BP 116/68   Pulse 84   Wt 202 lb (91.6 kg)   LMP 07/19/2022   BMI 33.61 kg/m   HISTORY: OB History  Gravida Para Term Preterm AB Living  4 3 2 1   3   SAB IAB Ectopic Multiple Live Births          3    # Outcome Date GA Lbr Len/2nd Weight Sex Delivery Anes PTL Lv  4 Current           3 Term 2019 [redacted]w[redacted]d   F CS-LTranv Spinal N LIV  2 Preterm 2015 [redacted]w[redacted]d   F CS-LTranv Spinal N LIV  1 Term 2013 [redacted]w[redacted]d   F  EPI N LIV    Past Medical History:  Diagnosis Date   Anxiety    Asthma    with pregnancy   History of kidney stones    History of migraine    Pre-eclampsia     Past Surgical History:  Procedure Laterality Date   CESAREAN SECTION  2013, 2015, 2019   x2   CYSTOSCOPY  2008 or 2009   CYSTOSCOPY W/ URETERAL STENT PLACEMENT Right 11/29/2020   Procedure: CYSTOSCOPY WITH RETROGRADE PYELOGRAM/URETERAL STENT PLACEMENT;  Surgeon: Franchot Gallo, MD;  Location: [facility];  Service: Urology;  Laterality: Right;   CYSTOSCOPY/URETEROSCOPY/HOLMIUM LASER/STENT PLACEMENT Right 12/13/2020   Procedure: CYSTOSCOPY RIGHT URETEROSCOPY/HOLMIUM LASER/STENT EXTRACTION AND RIGHT JJ STENT PLACEMENT, RIGHT RETROGRADE URETEROSCOPY;  Surgeon: Franchot Gallo, MD;  Location: [facility];  Service: Urology;  Laterality: Right;   URETER SURGERY     x2   WISDOM TOOTH EXTRACTION      Family History  Problem Relation Age of Onset   Hypertension Father      Exam  BP 116/68   Pulse 84   Wt 202 lb (91.6 kg)   LMP 07/19/2022   BMI 33.61 kg/m   Chaperone present during exam  CONSTITUTIONAL: Well-developed, well-nourished female in no  acute distress.  HENT:  Normocephalic, atraumatic, External right and left ear normal. Oropharynx is clear and moist EYES: Conjunctivae and EOM are normal. Pupils are equal, round, and reactive to light. No scleral icterus.  NECK: Normal range of motion, supple, no masses.  Normal thyroid.  CARDIOVASCULAR: Normal heart rate noted, regular rhythm RESPIRATORY: Clear to auscultation bilaterally. Effort and breath sounds normal, no problems with respiration noted. BREASTS: Symmetric in size. No masses, skin changes, nipple drainage, or lymphadenopathy. ABDOMEN: Soft, normal bowel sounds, no distention noted.  No tenderness, rebound or guarding.  PELVIC: Normal appearing external genitalia; normal appearing vaginal mucosa and cervix. No abnormal discharge noted. Normal uterine size, no other palpable masses, no uterine or adnexal tenderness. MUSCULOSKELETAL: Normal range of motion. No tenderness.  No cyanosis, clubbing, or edema.  2+ distal pulses. SKIN: Skin is warm and dry. No rash noted. Not diaphoretic. No erythema. No pallor. NEUROLOGIC: Alert and oriented to person, place, and time. Normal reflexes, muscle tone coordination. No cranial nerve deficit noted. PSYCHIATRIC: Normal mood and affect. Normal behavior. Normal judgment and thought content.    Assessment:    Pregnancy: [id number] Patient Active Problem List   Diagnosis Date Noted   Supervision of  other high risk pregnancy, antepartum 10/01/2022   History of cesarean section 10/01/2022   Essential hypertension 08/11/2022   Fibromyalgia 08/11/2022   Obesity (BMI 30.0-34.9) 08/11/2022   Eczema 06/30/2022   Polyarthralgia 06/30/2022   GAD (generalized anxiety disorder) 06/30/2022   PTSD (post-traumatic stress disorder) 06/30/2022   Major depressive disorder, single episode, mild ([place]) 06/30/2022   Calculus of ureter 11/29/2020      Plan:   1. Pregnancy with uncertain fetal viability, single or unspecified fetus - US OB Limited;  Future  2. Supervision of other high risk pregnancy, antepartum FHT normal. US shows live IUP. - CBC/D/Plt+RPR+Rh+ABO+RubIgG... - Urine Culture - [place] MFM OB DETAIL +14 WK; Future - Comprehensive metabolic panel - Protein / creatinine ratio, urine - Hemoglobin A1c - [facility] AMB BABYSCRIPTS OPT IN - Cytology - PAP( [place]) - POC Urinalysis Dipstick OB  3. History of cesarean section Will plan RLTCS at 39 weeks. - CBC/D/Plt+RPR+Rh+ABO+RubIgG... - Urine Culture - [place] MFM OB DETAIL +14 WK; Future - Comprehensive metabolic panel - Protein / creatinine ratio, urine - Hemoglobin A1c - CH[facility] AMB BABYSCRIPTS OPT IN - Cytology - PAP( [facility]) - POC Urinalysis Dipstick OB  4. Essential hypertension BP normal. Start ASA 81mg  at 12 weeks  5. Fibromyalgia On zoloft and seroquel.  Was on cymbalta prior to this.  6. Obesity (BMI 30.0-34.9) HgA1c.   Initial labs obtained Continue prenatal vitamins Reviewed n/v relief measures and warning s/s to report Reviewed recommended weight gain based on pre-gravid BMI Encouraged well-balanced diet Genetic & carrier screening discussed: requests [place],  Ultrasound discussed; fetal survey: requested [place] completed> form faxed if has or is planning to apply for medicaid The nature of [place] for [employer] with multiple MDs and other Advanced Practice Providers was explained to patient; also emphasized that fellows, residents, and students are part of our team.   Problem list reviewed and updated. 75% of 30 min visit spent on counseling and coordination of care.     Truett Mainland 10/01/2022

## 2022-10-02 LAB — CBC/D/PLT+RPR+RH+ABO+RUBIGG...
Antibody Screen: NEGATIVE
Basophils Absolute: 0.1 10*3/uL (ref 0.0–0.2)
Basos: 1 %
EOS (ABSOLUTE): 0.3 10*3/uL (ref 0.0–0.4)
Eos: 2 %
HCV Ab: NONREACTIVE
HIV Screen 4th Generation wRfx: NONREACTIVE
Hematocrit: 39.4 % (ref 34.0–46.6)
Hemoglobin: 13.4 g/dL (ref 11.1–15.9)
Hepatitis B Surface Ag: NEGATIVE
Immature Grans (Abs): 0.1 10*3/uL (ref 0.0–0.1)
Immature Granulocytes: 1 %
Lymphocytes Absolute: 2.4 10*3/uL (ref 0.7–3.1)
Lymphs: 17 %
MCH: 32 pg (ref 26.6–33.0)
MCHC: 34 g/dL (ref 31.5–35.7)
MCV: 94 fL (ref 79–97)
Monocytes Absolute: 1.2 10*3/uL — ABNORMAL HIGH (ref 0.1–0.9)
Monocytes: 8 %
Neutrophils Absolute: 9.9 10*3/uL — ABNORMAL HIGH (ref 1.4–7.0)
Neutrophils: 71 %
Platelets: 169 10*3/uL (ref 150–450)
RBC: 4.19 x10E6/uL (ref 3.77–5.28)
RDW: 12.9 % (ref 11.7–15.4)
RPR Ser Ql: NONREACTIVE
Rh Factor: POSITIVE
Rubella Antibodies, IGG: 1.13 index (ref >0.99)
WBC: 14 10*3/uL — ABNORMAL HIGH (ref 3.4–10.8)

## 2022-10-02 LAB — COMPREHENSIVE METABOLIC PANEL
ALT: 16 IU/L (ref 0–32)
AST: 23 IU/L (ref 0–40)
Albumin/Globulin Ratio: 1.6 (ref 1.2–2.2)
Albumin: 4.1 g/dL (ref 3.9–4.9)
Alkaline Phosphatase: 74 IU/L (ref 44–121)
BUN/Creatinine Ratio: 15 (ref 9–23)
BUN: 11 mg/dL (ref 6–20)
Bilirubin Total: 0.3 mg/dL (ref 0.0–1.2)
CO2: 16 mmol/L — ABNORMAL LOW (ref 20–29)
Calcium: 9.6 mg/dL (ref 8.7–10.2)
Chloride: 106 mmol/L (ref 96–106)
Creatinine, Ser: 0.72 mg/dL (ref 0.57–1.00)
Globulin, Total: 2.6 g/dL (ref 1.5–4.5)
Glucose: 88 mg/dL (ref 70–99)
Potassium: 4.2 mmol/L (ref 3.5–5.2)
Sodium: 137 mmol/L (ref 134–144)
Total Protein: 6.7 g/dL (ref 6.0–8.5)
eGFR: 111 mL/min/{1.73_m2} (ref >59)

## 2022-10-02 LAB — HCV INTERPRETATION

## 2022-10-02 LAB — PROTEIN / CREATININE RATIO, URINE
Creatinine, Urine: 138.2 mg/dL
Protein, Ur: 15.1 mg/dL
Protein/Creat Ratio: 109 mg/g creat (ref 0–200)

## 2022-10-02 LAB — HEMOGLOBIN A1C
Est. average glucose Bld gHb Est-mCnc: 108 mg/dL
Hgb A1c MFr Bld: 5.4 % (ref 4.8–5.6)

## 2022-10-03 LAB — CYTOLOGY - PAP
Chlamydia: NEGATIVE
Comment: NEGATIVE
Comment: NEGATIVE
Comment: NORMAL
Diagnosis: NEGATIVE
High risk HPV: NEGATIVE
Neisseria Gonorrhea: NEGATIVE

## 2022-10-03 LAB — URINE CULTURE

## 2022-10-16 DIAGNOSIS — F32A Depression, unspecified: Secondary | ICD-10-CM | POA: Diagnosis not present

## 2022-10-16 DIAGNOSIS — F419 Anxiety disorder, unspecified: Secondary | ICD-10-CM | POA: Diagnosis not present

## 2022-10-17 ENCOUNTER — Other Ambulatory Visit: Payer: Medicaid Other

## 2022-10-17 DIAGNOSIS — Z3A12 12 weeks gestation of pregnancy: Secondary | ICD-10-CM

## 2022-10-17 DIAGNOSIS — Z1379 Encounter for other screening for genetic and chromosomal anomalies: Secondary | ICD-10-CM

## 2022-10-17 DIAGNOSIS — O09521 Supervision of elderly multigravida, first trimester: Secondary | ICD-10-CM | POA: Diagnosis not present

## 2022-10-17 DIAGNOSIS — O09211 Supervision of pregnancy with history of pre-term labor, first trimester: Secondary | ICD-10-CM | POA: Diagnosis not present

## 2022-10-17 NOTE — Progress Notes (Signed)
Patient presents for [place]. Patient was sent to the lab to have labs drawn.  Thao Vanover l Rozell Theiler, CMA

## 2022-10-21 DIAGNOSIS — F32A Depression, unspecified: Secondary | ICD-10-CM | POA: Diagnosis not present

## 2022-10-21 DIAGNOSIS — F419 Anxiety disorder, unspecified: Secondary | ICD-10-CM | POA: Diagnosis not present

## 2022-10-26 LAB — PANORAMA PRENATAL TEST FULL PANEL:PANORAMA TEST PLUS 5 ADDITIONAL MICRODELETIONS: FETAL FRACTION: 3.5

## 2022-10-27 LAB — HORIZON CUSTOM: REPORT SUMMARY: NEGATIVE

## 2022-10-29 ENCOUNTER — Ambulatory Visit (INDEPENDENT_AMBULATORY_CARE_PROVIDER_SITE_OTHER): Payer: Medicaid Other | Admitting: Family Medicine

## 2022-10-29 VITALS — BP 130/76 | HR 82 | Wt 200.0 lb

## 2022-10-29 DIAGNOSIS — I10 Essential (primary) hypertension: Secondary | ICD-10-CM

## 2022-10-29 DIAGNOSIS — Z3A14 14 weeks gestation of pregnancy: Secondary | ICD-10-CM

## 2022-10-29 DIAGNOSIS — F411 Generalized anxiety disorder: Secondary | ICD-10-CM

## 2022-10-29 DIAGNOSIS — Z98891 History of uterine scar from previous surgery: Secondary | ICD-10-CM

## 2022-10-29 DIAGNOSIS — O09899 Supervision of other high risk pregnancies, unspecified trimester: Secondary | ICD-10-CM

## 2022-10-29 DIAGNOSIS — O09892 Supervision of other high risk pregnancies, second trimester: Secondary | ICD-10-CM

## 2022-10-29 NOTE — Progress Notes (Signed)
   PRENATAL VISIT NOTE  Subjective:  Laurie Casey is a 37 y.o. [phone] at [redacted]w[redacted]d being seen today for ongoing prenatal care.  She is currently monitored for the following issues for this high-risk pregnancy and has Calculus of ureter; Eczema; Polyarthralgia; GAD (generalized anxiety disorder); PTSD (post-traumatic stress disorder); Major depressive disorder, single episode, mild; Essential hypertension; Fibromyalgia; Obesity (BMI 30.0-34.9); Supervision of other high risk pregnancy, antepartum; and History of cesarean section on their problem list.  Patient reports no complaints.  Contractions: Not present. Vag. Bleeding: None.  Movement: Present. Denies leaking of fluid.   The following portions of the patient's history were reviewed and updated as appropriate: allergies, current medications, past family history, past medical history, past social history, past surgical history and problem list.   Objective:   Vitals:   10/29/22 0855  BP: 130/76  Pulse: 82  Weight: 200 lb (90.7 kg)    Fetal Status:     Movement: Present     General:  Alert, oriented and cooperative. Patient is in no acute distress.  Skin: Skin is warm and dry. No rash noted.   Cardiovascular: Normal heart rate noted  Respiratory: Normal respiratory effort, no problems with respiration noted  Abdomen: Soft, gravid, appropriate for gestational age.  Pain/Pressure: Present (round ligament pain)     Pelvic: Cervical exam deferred        Extremities: Normal range of motion.  Edema: None  Mental Status: Normal mood and affect. Normal behavior. Normal judgment and thought content.   Assessment and Plan:  Pregnancy: G4P2103 at [redacted]w[redacted]d 1. [redacted] weeks gestation of pregnancy  2. Supervision of other high risk pregnancy, antepartum FHT and FH normal  3. History of cesarean section RLTCS at 39 weeks On labetalol  4. Essential hypertension BP normal  5. GAD (generalized anxiety disorder)   Preterm labor symptoms and  general obstetric precautions including but not limited to vaginal bleeding, contractions, leaking of fluid and fetal movement were reviewed in detail with the patient. Please refer to After Visit Summary for other counseling recommendations.   No follow-ups on file.  Future Appointments  Date Time Provider Department Center  11/26/2022  8:15 AM Levie Heritage, DO [facility] None  12/03/2022  8:15 AM [facility] NURSE [facility]  12/03/2022  8:30 AM [facility] US3 [facility]  12/15/2022  4:10 PM Marcine Matar, MD [facility] None  12/25/2022  8:15 AM Levie Heritage, DO [facility] None    Levie Heritage, DO

## 2022-10-30 DIAGNOSIS — F32A Depression, unspecified: Secondary | ICD-10-CM | POA: Diagnosis not present

## 2022-10-30 DIAGNOSIS — F419 Anxiety disorder, unspecified: Secondary | ICD-10-CM | POA: Diagnosis not present

## 2022-11-03 ENCOUNTER — Encounter: Payer: Self-pay | Admitting: Family Medicine

## 2022-11-08 DIAGNOSIS — F4389 Other reactions to severe stress: Secondary | ICD-10-CM | POA: Diagnosis not present

## 2022-11-18 DIAGNOSIS — F32A Depression, unspecified: Secondary | ICD-10-CM | POA: Diagnosis not present

## 2022-11-18 DIAGNOSIS — F419 Anxiety disorder, unspecified: Secondary | ICD-10-CM | POA: Diagnosis not present

## 2022-11-19 ENCOUNTER — Encounter: Payer: Self-pay | Admitting: Family Medicine

## 2022-11-19 ENCOUNTER — Encounter: Payer: Self-pay | Admitting: Internal Medicine

## 2022-11-19 DIAGNOSIS — F32A Depression, unspecified: Secondary | ICD-10-CM | POA: Diagnosis not present

## 2022-11-19 DIAGNOSIS — F419 Anxiety disorder, unspecified: Secondary | ICD-10-CM | POA: Diagnosis not present

## 2022-11-21 MED ORDER — TIZANIDINE HCL 4 MG PO TABS: 4.0000 mg | ORAL_TABLET | Freq: Four times a day (QID) | ORAL | 3 refills | Status: DC | PRN

## 2022-11-26 ENCOUNTER — Ambulatory Visit (INDEPENDENT_AMBULATORY_CARE_PROVIDER_SITE_OTHER): Payer: Medicaid Other | Admitting: Family Medicine

## 2022-11-26 VITALS — BP 118/67 | HR 80 | Wt 202.0 lb

## 2022-11-26 DIAGNOSIS — Z98891 History of uterine scar from previous surgery: Secondary | ICD-10-CM

## 2022-11-26 DIAGNOSIS — Z3A18 18 weeks gestation of pregnancy: Secondary | ICD-10-CM

## 2022-11-26 DIAGNOSIS — O09899 Supervision of other high risk pregnancies, unspecified trimester: Secondary | ICD-10-CM | POA: Diagnosis not present

## 2022-11-26 DIAGNOSIS — E669 Obesity, unspecified: Secondary | ICD-10-CM | POA: Diagnosis not present

## 2022-11-26 DIAGNOSIS — I10 Essential (primary) hypertension: Secondary | ICD-10-CM | POA: Diagnosis not present

## 2022-11-26 DIAGNOSIS — M797 Fibromyalgia: Secondary | ICD-10-CM

## 2022-11-26 DIAGNOSIS — O09522 Supervision of elderly multigravida, second trimester: Secondary | ICD-10-CM | POA: Diagnosis not present

## 2022-11-26 NOTE — Progress Notes (Signed)
   PRENATAL VISIT NOTE  Subjective:  Laurie Casey is a 37 y.o. [phone] at [redacted]w[redacted]d being seen today for ongoing prenatal care.  She is currently monitored for the following issues for this high-risk pregnancy and has Calculus of ureter; Eczema; Polyarthralgia; GAD (generalized anxiety disorder); PTSD (post-traumatic stress disorder); Major depressive disorder, single episode, mild ([facility]); Essential hypertension; Fibromyalgia; Obesity (BMI 30.0-34.9); Supervision of other high risk pregnancy, antepartum; and History of cesarean section on their problem list.  Patient reports  fibromyalgia improved with occasional use of muscle relaxant .  Contractions: Not present. Vag. Bleeding: None.  Movement: Present. Denies leaking of fluid.   The following portions of the patient's history were reviewed and updated as appropriate: allergies, current medications, past family history, past medical history, past social history, past surgical history and problem list.   Objective:   Vitals:   11/26/22 0820  BP: 118/67  Pulse: 80  Weight: 202 lb (91.6 kg)    Fetal Status: Fetal Heart Rate (bpm): 151 Fundal Height: 18 cm Movement: Present     General:  Alert, oriented and cooperative. Patient is in no acute distress.  Skin: Skin is warm and dry. No rash noted.   Cardiovascular: Normal heart rate noted  Respiratory: Normal respiratory effort, no problems with respiration noted  Abdomen: Soft, gravid, appropriate for gestational age.  Pain/Pressure: Absent     Pelvic: Cervical exam deferred        Extremities: Normal range of motion.  Edema: None  Mental Status: Normal mood and affect. Normal behavior. Normal judgment and thought content.   Assessment and Plan:  Pregnancy: G4P2103 at [redacted]w[redacted]d 1. [redacted] weeks gestation of pregnancy  - AFP, Serum, Open Spina Bifida  2. Supervision of other high risk pregnancy, antepartum FHT and FH normal  3. Essential hypertension BP controlled ASA 81mg   4. History  of cesarean section RLTCS desired  5. Fibromyalgia improved  6. Obesity (BMI 30.0-34.9)  Preterm labor symptoms and general obstetric precautions including but not limited to vaginal bleeding, contractions, leaking of fluid and fetal movement were reviewed in detail with the patient. Please refer to After Visit Summary for other counseling recommendations.   No follow-ups on file.  Future Appointments  Date Time Provider Department Center  12/03/2022  8:15 AM [facility] NURSE [facility]  12/03/2022  8:30 AM [facility] US3 [facility]  12/15/2022  4:10 PM Marcine Matar, MD [facility] None  12/25/2022  8:15 AM Levie Heritage, DO [facility] None    Levie Heritage, DO

## 2022-11-28 LAB — AFP, SERUM, OPEN SPINA BIFIDA
AFP MoM: 1.38
AFP Value: 50.8 ng/mL
Gest. Age on Collection Date: 18 weeks
Maternal Age At EDD: 37 yr
OSBR Risk 1 IN: 3810
Test Results:: NEGATIVE
Weight: 202 [lb_av]

## 2022-12-02 ENCOUNTER — Encounter: Payer: Self-pay | Admitting: *Deleted

## 2022-12-03 ENCOUNTER — Ambulatory Visit: Payer: Medicaid Other | Admitting: *Deleted

## 2022-12-03 ENCOUNTER — Ambulatory Visit: Payer: Medicaid Other | Attending: Family Medicine

## 2022-12-03 ENCOUNTER — Ambulatory Visit: Payer: Medicaid Other | Attending: Family Medicine | Admitting: Maternal & Fetal Medicine

## 2022-12-03 ENCOUNTER — Encounter: Payer: Self-pay | Admitting: *Deleted

## 2022-12-03 ENCOUNTER — Other Ambulatory Visit: Payer: Self-pay | Admitting: *Deleted

## 2022-12-03 VITALS — BP 122/65 | HR 72

## 2022-12-03 DIAGNOSIS — O43212 Placenta accreta, second trimester: Secondary | ICD-10-CM

## 2022-12-03 DIAGNOSIS — O09292 Supervision of pregnancy with other poor reproductive or obstetric history, second trimester: Secondary | ICD-10-CM | POA: Diagnosis not present

## 2022-12-03 DIAGNOSIS — O4402 Placenta previa specified as without hemorrhage, second trimester: Secondary | ICD-10-CM

## 2022-12-03 DIAGNOSIS — O35EXX0 Maternal care for other (suspected) fetal abnormality and damage, fetal genitourinary anomalies, not applicable or unspecified: Secondary | ICD-10-CM | POA: Diagnosis not present

## 2022-12-03 DIAGNOSIS — Z3A19 19 weeks gestation of pregnancy: Secondary | ICD-10-CM

## 2022-12-03 DIAGNOSIS — O34219 Maternal care for unspecified type scar from previous cesarean delivery: Secondary | ICD-10-CM | POA: Diagnosis not present

## 2022-12-03 DIAGNOSIS — Z98891 History of uterine scar from previous surgery: Secondary | ICD-10-CM | POA: Insufficient documentation

## 2022-12-03 DIAGNOSIS — O10012 Pre-existing essential hypertension complicating pregnancy, second trimester: Secondary | ICD-10-CM

## 2022-12-03 DIAGNOSIS — H21529 Goniosynechiae, unspecified eye: Secondary | ICD-10-CM

## 2022-12-03 DIAGNOSIS — O09899 Supervision of other high risk pregnancies, unspecified trimester: Secondary | ICD-10-CM | POA: Insufficient documentation

## 2022-12-03 DIAGNOSIS — F419 Anxiety disorder, unspecified: Secondary | ICD-10-CM | POA: Diagnosis not present

## 2022-12-03 DIAGNOSIS — I10 Essential (primary) hypertension: Secondary | ICD-10-CM

## 2022-12-03 DIAGNOSIS — O09299 Supervision of pregnancy with other poor reproductive or obstetric history, unspecified trimester: Secondary | ICD-10-CM | POA: Diagnosis not present

## 2022-12-03 DIAGNOSIS — F32A Depression, unspecified: Secondary | ICD-10-CM | POA: Diagnosis not present

## 2022-12-03 DIAGNOSIS — O99212 Obesity complicating pregnancy, second trimester: Secondary | ICD-10-CM

## 2022-12-03 DIAGNOSIS — O43219 Placenta accreta, unspecified trimester: Secondary | ICD-10-CM | POA: Insufficient documentation

## 2022-12-03 NOTE — Progress Notes (Addendum)
Patient information  Patient Name: Laurie Casey  Patient MRN:   [id number]  Referring practice: [facility] Referring Provider: [place] [facility] [place] [facility])  MFM CONSULT  Laurie Casey is a 37 y.o. [id number] at [redacted]w[redacted]d here for ultrasound and consultation.   There is concern for placenta accreta on today's ultrasound.  The patient has 3 prior C-sections and 1 D&C.  She now has a anterior placenta previa that has numerous features consistent with placenta accreta such as loss of the retroplacental clear space, bridging vessels, retroplacental hypervascularity.  After reviewing the early ultrasound she had it appears that this is a in the niche placenta accreta or C-section scar pregnancy.  Since she is already 19 weeks I discussed the management, prognosis and clinical implications of placenta accreta.  I discussed the increased risk of maternal and fetal morbidity and mortality associated with hemorrhage.  I also discussed the surgical treatment requiring the cesarean delivery followed by hysterectomy.  I discussed that these pregnancies can do well as long as she monitors her pain and bleeding and promptly goes to any hospital with any concerns.  I discussed that delivery occurs around typically 34 to 35 weeks or sooner if indicated.  We will repeat an ultrasound in 4 weeks and if the concern is still present we will refer to a tertiary care facility that frequently delivers placenta accreta.  She had concern that she was not truly diagnosed with chronic hypertension her, she reports that she has been on blood pressure medications outside of pregnancy.  Today her blood pressure is normal but we will continue to follow during pregnancy.  I discussed that is probably prudent to keep the diagnosis of chronic hypertension since she was on blood pressure medicine outside of pregnancy.  Sonographic findings Single intrauterine pregnancy. Fetal cardiac activity:  Observed and appears normal. Presentation:  Cephalic. The anatomic structures that were well seen appear normal without evidence of soft markers except for mild bilateral pylectasis. Due to poor acoustic windows, the visualization some structures remain suboptimally seen. Fetal biometry is consistent with dates Amniotic fluid volume: Within normal limits. MVP: 7.07 cm. Placenta: Anterior previa, possible accreta (loss of the retroplacental clear space, bridging vessels, retroplacental hypervascularity).  Assessment -Placenta accreta  -Anterior placenta previa  -Chronic hypertension -Fetal urinary tract dilation (mild bilateral) Plan -Repeat US in 4 weeks -If ptyalectasis persists we will counseled further and arrange post delivery peds urology consult.  -If PAS is still a concern we will refer out to a tertiary facility.  If placenta accreta is suspected at the outlying facility she will be scheduled for delivery at that facility around 34 to 35 weeks. -Pelvic, bleeding, pain precautions given.  Patient knows to go to the nearest ER if she has any concerns.  Review of Systems: A review of systems was performed and was negative except per HPI   Vitals and Physical Exam    12/03/2022    8:17 AM 11/26/2022    8:20 AM 10/29/2022    8:55 AM  Vitals with BMI  Weight  202 lbs 200 lbs  Systolic 122 118 725  Diastolic 65 67 76  Pulse 72 80 82  Sitting comfortably on the sonogram table Nonlabored breathing Normal rate and rhythm Abdomen is nontender  Past pregnancies OB History  Gravida Para Term Preterm AB Living  11 3 2 1 7 3   SAB IAB Ectopic Multiple Live Births  7       3    #  Outcome Date GA Lbr Len/2nd Weight Sex Delivery Anes PTL Lv  11 Current           10 Term 2019 [redacted]w[redacted]d   F CS-LTranv Spinal N LIV  9 Preterm 2015 [redacted]w[redacted]d   F CS-LTranv Spinal N LIV  8 Term 2013 [redacted]w[redacted]d   F  EPI N LIV  7 SAB           6 SAB           5 SAB           4 SAB           3 SAB           2 SAB           1 SAB             I spent 60  minutes reviewing the patients chart, including labs and images as well as counseling the patient about her medical conditions. Greater than 50% of the time was spent in direct face-to-face patient counseling.  Laurie Casey  MFM, [facility]   12/03/2022  1:49 PM

## 2022-12-04 ENCOUNTER — Encounter: Payer: Self-pay | Admitting: Family Medicine

## 2022-12-04 NOTE — Telephone Encounter (Signed)
I called patient and discussed results of [place] - has placenta previa with placenta accreta spectrum. She is already scheduled for [place] in 3 weeks. She is pretty worried about the diagnosis. I discussed surveillance, continued workup, and plan. I discussed pelvic rest. It is ok for her to get a massage. Encouraged the patient to send questions via mychart or to call.   While we don't plan on delivery at [facility] hospital with patients with placenta accreta spectrum, advised that she can still come to the hospital for emergencies.  Levie Heritage, DO

## 2022-12-15 ENCOUNTER — Other Ambulatory Visit: Payer: Self-pay | Admitting: Internal Medicine

## 2022-12-15 ENCOUNTER — Ambulatory Visit: Payer: Medicaid Other | Admitting: Internal Medicine

## 2022-12-16 ENCOUNTER — Encounter: Payer: Self-pay | Admitting: Internal Medicine

## 2022-12-16 NOTE — Telephone Encounter (Signed)
Requested medications are due for refill today.  no  Requested medications are on the active medications list.  yes  Last refill. 12/15/2022 #30 0 rf  Future visit scheduled.   no  Notes to clinic.  Pt is requesting  a 90 day supply.    Requested Prescriptions  Pending Prescriptions Disp Refills   labetalol (NORMODYNE) 100 MG tablet [Pharmacy Med Name: LABETALOL 100MG  TABLETS] 90 tablet     Sig: TAKE 1/2 TABLET(50 MG) BY MOUTH TWICE DAILY     Cardiovascular:  Beta Blockers Passed - 12/15/2022  5:32 PM      Passed - Last BP in normal range    BP Readings from Last 1 Encounters:  12/03/22 122/65         Passed - Last Heart Rate in normal range    Pulse Readings from Last 1 Encounters:  12/03/22 72         Passed - Valid encounter within last 6 months    Recent Outpatient Visits           4 months ago Stressful life event affecting family   [facility] Marcine Matar, MD   5 months ago Establishing care with new doctor, encounter for   [facility] Marcine Matar, MD

## 2022-12-17 ENCOUNTER — Other Ambulatory Visit: Payer: Self-pay | Admitting: Internal Medicine

## 2022-12-17 DIAGNOSIS — F32A Depression, unspecified: Secondary | ICD-10-CM | POA: Diagnosis not present

## 2022-12-17 DIAGNOSIS — F419 Anxiety disorder, unspecified: Secondary | ICD-10-CM | POA: Diagnosis not present

## 2022-12-17 MED ORDER — LABETALOL HCL 100 MG PO TABS: ORAL_TABLET | ORAL | 1 refills | Status: DC

## 2022-12-22 DIAGNOSIS — I10 Essential (primary) hypertension: Secondary | ICD-10-CM | POA: Diagnosis not present

## 2022-12-22 DIAGNOSIS — O4402 Placenta previa specified as without hemorrhage, second trimester: Secondary | ICD-10-CM | POA: Diagnosis not present

## 2022-12-22 DIAGNOSIS — F4310 Post-traumatic stress disorder, unspecified: Secondary | ICD-10-CM | POA: Diagnosis not present

## 2022-12-22 DIAGNOSIS — O0943 Supervision of pregnancy with grand multiparity, third trimester: Secondary | ICD-10-CM | POA: Diagnosis not present

## 2022-12-22 DIAGNOSIS — M797 Fibromyalgia: Secondary | ICD-10-CM | POA: Diagnosis not present

## 2022-12-22 DIAGNOSIS — O358XX0 Maternal care for other (suspected) fetal abnormality and damage, not applicable or unspecified: Secondary | ICD-10-CM | POA: Diagnosis not present

## 2022-12-22 DIAGNOSIS — Z8742 Personal history of other diseases of the female genital tract: Secondary | ICD-10-CM | POA: Diagnosis not present

## 2022-12-22 DIAGNOSIS — Z1389 Encounter for screening for other disorder: Secondary | ICD-10-CM | POA: Diagnosis not present

## 2022-12-22 DIAGNOSIS — Z3A21 21 weeks gestation of pregnancy: Secondary | ICD-10-CM | POA: Diagnosis not present

## 2022-12-22 DIAGNOSIS — O43102 Malformation of placenta, unspecified, second trimester: Secondary | ICD-10-CM | POA: Diagnosis not present

## 2022-12-22 DIAGNOSIS — O0992 Supervision of high risk pregnancy, unspecified, second trimester: Secondary | ICD-10-CM | POA: Diagnosis not present

## 2022-12-22 DIAGNOSIS — O43212 Placenta accreta, second trimester: Secondary | ICD-10-CM | POA: Diagnosis not present

## 2022-12-25 ENCOUNTER — Encounter: Payer: Medicaid Other | Admitting: Family Medicine

## 2022-12-26 ENCOUNTER — Ambulatory Visit: Payer: Medicaid Other

## 2023-01-01 DIAGNOSIS — O10912 Unspecified pre-existing hypertension complicating pregnancy, second trimester: Secondary | ICD-10-CM | POA: Diagnosis not present

## 2023-01-01 DIAGNOSIS — O09522 Supervision of elderly multigravida, second trimester: Secondary | ICD-10-CM | POA: Diagnosis not present

## 2023-01-01 DIAGNOSIS — Z3A23 23 weeks gestation of pregnancy: Secondary | ICD-10-CM | POA: Diagnosis not present

## 2023-01-01 DIAGNOSIS — O162 Unspecified maternal hypertension, second trimester: Secondary | ICD-10-CM | POA: Diagnosis not present

## 2023-01-01 DIAGNOSIS — F32A Depression, unspecified: Secondary | ICD-10-CM | POA: Diagnosis not present

## 2023-01-01 DIAGNOSIS — Z3689 Encounter for other specified antenatal screening: Secondary | ICD-10-CM | POA: Diagnosis not present

## 2023-01-01 DIAGNOSIS — O4290 Premature rupture of membranes, unspecified as to length of time between rupture and onset of labor, unspecified weeks of gestation: Secondary | ICD-10-CM | POA: Diagnosis not present

## 2023-01-01 DIAGNOSIS — Z3A22 22 weeks gestation of pregnancy: Secondary | ICD-10-CM | POA: Diagnosis not present

## 2023-01-01 DIAGNOSIS — F419 Anxiety disorder, unspecified: Secondary | ICD-10-CM | POA: Diagnosis not present

## 2023-01-14 DIAGNOSIS — F419 Anxiety disorder, unspecified: Secondary | ICD-10-CM | POA: Diagnosis not present

## 2023-01-14 DIAGNOSIS — F32A Depression, unspecified: Secondary | ICD-10-CM | POA: Diagnosis not present

## 2023-01-27 ENCOUNTER — Encounter (HOSPITAL_BASED_OUTPATIENT_CLINIC_OR_DEPARTMENT_OTHER): Payer: Medicaid Other | Admitting: Internal Medicine

## 2023-01-27 DIAGNOSIS — J029 Acute pharyngitis, unspecified: Secondary | ICD-10-CM | POA: Diagnosis not present

## 2023-01-28 MED ORDER — AMOXICILLIN 500 MG PO CAPS: 500.0000 mg | ORAL_CAPSULE | Freq: Three times a day (TID) | ORAL | 0 refills | Status: AC

## 2023-01-28 NOTE — Telephone Encounter (Signed)
Please see the MyChart message reply(ies) for my assessment and plan.    This patient gave consent for this Medical Advice Message and is aware that it may result in a bill to their insurance company, as well as the possibility of receiving a bill for a co-payment or deductible. They are an established patient, but are not seeking medical advice exclusively about a problem treated during an in person or video visit in the last seven days. I did not recommend an in person or video visit within seven days of my reply.    I spent a total of 5 minutes cumulative time within 7 days through MyChart messaging.  Deborah Johnson, MD   

## 2023-01-29 DIAGNOSIS — O43212 Placenta accreta, second trimester: Secondary | ICD-10-CM | POA: Diagnosis not present

## 2023-01-29 DIAGNOSIS — Z23 Encounter for immunization: Secondary | ICD-10-CM | POA: Diagnosis not present

## 2023-01-29 DIAGNOSIS — I10 Essential (primary) hypertension: Secondary | ICD-10-CM | POA: Diagnosis not present

## 2023-01-29 DIAGNOSIS — O365920 Maternal care for other known or suspected poor fetal growth, second trimester, not applicable or unspecified: Secondary | ICD-10-CM | POA: Diagnosis not present

## 2023-01-29 DIAGNOSIS — Z3A27 27 weeks gestation of pregnancy: Secondary | ICD-10-CM | POA: Diagnosis not present

## 2023-01-29 DIAGNOSIS — O0992 Supervision of high risk pregnancy, unspecified, second trimester: Secondary | ICD-10-CM | POA: Diagnosis not present

## 2023-01-29 DIAGNOSIS — O321XX0 Maternal care for breech presentation, not applicable or unspecified: Secondary | ICD-10-CM | POA: Diagnosis not present

## 2023-01-29 DIAGNOSIS — Z362 Encounter for other antenatal screening follow-up: Secondary | ICD-10-CM | POA: Diagnosis not present

## 2023-01-29 DIAGNOSIS — Z3A26 26 weeks gestation of pregnancy: Secondary | ICD-10-CM | POA: Diagnosis not present

## 2023-01-29 DIAGNOSIS — O35EXX0 Maternal care for other (suspected) fetal abnormality and damage, fetal genitourinary anomalies, not applicable or unspecified: Secondary | ICD-10-CM | POA: Diagnosis not present

## 2023-01-29 DIAGNOSIS — O09522 Supervision of elderly multigravida, second trimester: Secondary | ICD-10-CM | POA: Diagnosis not present

## 2023-01-29 DIAGNOSIS — F419 Anxiety disorder, unspecified: Secondary | ICD-10-CM | POA: Diagnosis not present

## 2023-01-29 DIAGNOSIS — F32A Depression, unspecified: Secondary | ICD-10-CM | POA: Diagnosis not present

## 2023-02-05 DIAGNOSIS — Z3A28 28 weeks gestation of pregnancy: Secondary | ICD-10-CM | POA: Diagnosis not present

## 2023-02-05 DIAGNOSIS — O365930 Maternal care for other known or suspected poor fetal growth, third trimester, not applicable or unspecified: Secondary | ICD-10-CM | POA: Diagnosis not present

## 2023-02-10 DIAGNOSIS — Z8742 Personal history of other diseases of the female genital tract: Secondary | ICD-10-CM | POA: Diagnosis not present

## 2023-02-10 DIAGNOSIS — O43213 Placenta accreta, third trimester: Secondary | ICD-10-CM | POA: Diagnosis not present

## 2023-02-10 DIAGNOSIS — O365930 Maternal care for other known or suspected poor fetal growth, third trimester, not applicable or unspecified: Secondary | ICD-10-CM | POA: Diagnosis not present

## 2023-02-10 DIAGNOSIS — F4310 Post-traumatic stress disorder, unspecified: Secondary | ICD-10-CM | POA: Diagnosis not present

## 2023-02-10 DIAGNOSIS — O35EXX0 Maternal care for other (suspected) fetal abnormality and damage, fetal genitourinary anomalies, not applicable or unspecified: Secondary | ICD-10-CM | POA: Diagnosis not present

## 2023-02-10 DIAGNOSIS — O99343 Other mental disorders complicating pregnancy, third trimester: Secondary | ICD-10-CM | POA: Diagnosis not present

## 2023-02-10 DIAGNOSIS — Z3A29 29 weeks gestation of pregnancy: Secondary | ICD-10-CM | POA: Diagnosis not present

## 2023-02-10 DIAGNOSIS — Z1332 Encounter for screening for maternal depression: Secondary | ICD-10-CM | POA: Diagnosis not present

## 2023-02-10 DIAGNOSIS — O365990 Maternal care for other known or suspected poor fetal growth, unspecified trimester, not applicable or unspecified: Secondary | ICD-10-CM | POA: Diagnosis not present

## 2023-02-10 DIAGNOSIS — O10013 Pre-existing essential hypertension complicating pregnancy, third trimester: Secondary | ICD-10-CM | POA: Diagnosis not present

## 2023-02-11 DIAGNOSIS — F32A Depression, unspecified: Secondary | ICD-10-CM | POA: Diagnosis not present

## 2023-02-11 DIAGNOSIS — F419 Anxiety disorder, unspecified: Secondary | ICD-10-CM | POA: Diagnosis not present

## 2023-02-24 DIAGNOSIS — O4403 Placenta previa specified as without hemorrhage, third trimester: Secondary | ICD-10-CM | POA: Diagnosis not present

## 2023-02-24 DIAGNOSIS — O365990 Maternal care for other known or suspected poor fetal growth, unspecified trimester, not applicable or unspecified: Secondary | ICD-10-CM | POA: Diagnosis not present

## 2023-02-24 DIAGNOSIS — O43219 Placenta accreta, unspecified trimester: Secondary | ICD-10-CM | POA: Diagnosis not present

## 2023-02-24 DIAGNOSIS — O0993 Supervision of high risk pregnancy, unspecified, third trimester: Secondary | ICD-10-CM | POA: Diagnosis not present

## 2023-02-24 DIAGNOSIS — O43213 Placenta accreta, third trimester: Secondary | ICD-10-CM | POA: Diagnosis not present

## 2023-02-24 DIAGNOSIS — O99013 Anemia complicating pregnancy, third trimester: Secondary | ICD-10-CM | POA: Diagnosis not present

## 2023-02-24 DIAGNOSIS — I10 Essential (primary) hypertension: Secondary | ICD-10-CM | POA: Diagnosis not present

## 2023-02-24 DIAGNOSIS — O10013 Pre-existing essential hypertension complicating pregnancy, third trimester: Secondary | ICD-10-CM | POA: Diagnosis not present

## 2023-02-24 DIAGNOSIS — O365930 Maternal care for other known or suspected poor fetal growth, third trimester, not applicable or unspecified: Secondary | ICD-10-CM | POA: Diagnosis not present

## 2023-02-24 DIAGNOSIS — Z362 Encounter for other antenatal screening follow-up: Secondary | ICD-10-CM | POA: Diagnosis not present

## 2023-02-24 DIAGNOSIS — O09523 Supervision of elderly multigravida, third trimester: Secondary | ICD-10-CM | POA: Diagnosis not present

## 2023-02-24 DIAGNOSIS — D649 Anemia, unspecified: Secondary | ICD-10-CM | POA: Diagnosis not present

## 2023-02-24 DIAGNOSIS — Z3A29 29 weeks gestation of pregnancy: Secondary | ICD-10-CM | POA: Diagnosis not present

## 2023-02-24 DIAGNOSIS — O358XX0 Maternal care for other (suspected) fetal abnormality and damage, not applicable or unspecified: Secondary | ICD-10-CM | POA: Diagnosis not present

## 2023-02-24 DIAGNOSIS — Z3A31 31 weeks gestation of pregnancy: Secondary | ICD-10-CM | POA: Diagnosis not present

## 2023-02-24 DIAGNOSIS — O365130 Maternal care for known or suspected placental insufficiency, third trimester, not applicable or unspecified: Secondary | ICD-10-CM | POA: Diagnosis not present

## 2023-02-24 DIAGNOSIS — O35EXX0 Maternal care for other (suspected) fetal abnormality and damage, fetal genitourinary anomalies, not applicable or unspecified: Secondary | ICD-10-CM | POA: Diagnosis not present

## 2023-02-24 DIAGNOSIS — O43212 Placenta accreta, second trimester: Secondary | ICD-10-CM | POA: Diagnosis not present

## 2023-02-24 DIAGNOSIS — O43103 Malformation of placenta, unspecified, third trimester: Secondary | ICD-10-CM | POA: Diagnosis not present

## 2023-03-03 DIAGNOSIS — Z3A32 32 weeks gestation of pregnancy: Secondary | ICD-10-CM | POA: Diagnosis not present

## 2023-03-03 DIAGNOSIS — O365930 Maternal care for other known or suspected poor fetal growth, third trimester, not applicable or unspecified: Secondary | ICD-10-CM | POA: Diagnosis not present

## 2023-03-03 DIAGNOSIS — O35EXX0 Maternal care for other (suspected) fetal abnormality and damage, fetal genitourinary anomalies, not applicable or unspecified: Secondary | ICD-10-CM | POA: Diagnosis not present

## 2023-03-04 DIAGNOSIS — F419 Anxiety disorder, unspecified: Secondary | ICD-10-CM | POA: Diagnosis not present

## 2023-03-04 DIAGNOSIS — F32A Depression, unspecified: Secondary | ICD-10-CM | POA: Diagnosis not present

## 2023-03-05 DIAGNOSIS — F32A Depression, unspecified: Secondary | ICD-10-CM | POA: Diagnosis not present

## 2023-03-05 DIAGNOSIS — F419 Anxiety disorder, unspecified: Secondary | ICD-10-CM | POA: Diagnosis not present

## 2023-03-10 DIAGNOSIS — I10 Essential (primary) hypertension: Secondary | ICD-10-CM | POA: Diagnosis not present

## 2023-03-10 DIAGNOSIS — O365930 Maternal care for other known or suspected poor fetal growth, third trimester, not applicable or unspecified: Secondary | ICD-10-CM | POA: Diagnosis not present

## 2023-03-10 DIAGNOSIS — O365990 Maternal care for other known or suspected poor fetal growth, unspecified trimester, not applicable or unspecified: Secondary | ICD-10-CM | POA: Diagnosis not present

## 2023-03-10 DIAGNOSIS — O35EXX0 Maternal care for other (suspected) fetal abnormality and damage, fetal genitourinary anomalies, not applicable or unspecified: Secondary | ICD-10-CM | POA: Diagnosis not present

## 2023-03-10 DIAGNOSIS — Z3A33 33 weeks gestation of pregnancy: Secondary | ICD-10-CM | POA: Diagnosis not present

## 2023-03-10 DIAGNOSIS — O43212 Placenta accreta, second trimester: Secondary | ICD-10-CM | POA: Diagnosis not present

## 2023-03-10 DIAGNOSIS — O09523 Supervision of elderly multigravida, third trimester: Secondary | ICD-10-CM | POA: Diagnosis not present

## 2023-03-10 DIAGNOSIS — M797 Fibromyalgia: Secondary | ICD-10-CM | POA: Diagnosis not present

## 2023-03-10 DIAGNOSIS — O0993 Supervision of high risk pregnancy, unspecified, third trimester: Secondary | ICD-10-CM | POA: Diagnosis not present

## 2023-03-12 DIAGNOSIS — O43212 Placenta accreta, second trimester: Secondary | ICD-10-CM | POA: Diagnosis not present

## 2023-03-17 DIAGNOSIS — Z3A34 34 weeks gestation of pregnancy: Secondary | ICD-10-CM | POA: Diagnosis not present

## 2023-03-17 DIAGNOSIS — I10 Essential (primary) hypertension: Secondary | ICD-10-CM | POA: Diagnosis not present

## 2023-03-17 DIAGNOSIS — O35EXX0 Maternal care for other (suspected) fetal abnormality and damage, fetal genitourinary anomalies, not applicable or unspecified: Secondary | ICD-10-CM | POA: Diagnosis not present

## 2023-03-17 DIAGNOSIS — O365930 Maternal care for other known or suspected poor fetal growth, third trimester, not applicable or unspecified: Secondary | ICD-10-CM | POA: Diagnosis not present

## 2023-03-17 DIAGNOSIS — O365990 Maternal care for other known or suspected poor fetal growth, unspecified trimester, not applicable or unspecified: Secondary | ICD-10-CM | POA: Diagnosis not present

## 2023-03-17 DIAGNOSIS — F4310 Post-traumatic stress disorder, unspecified: Secondary | ICD-10-CM | POA: Diagnosis not present

## 2023-03-17 DIAGNOSIS — O43213 Placenta accreta, third trimester: Secondary | ICD-10-CM | POA: Diagnosis not present

## 2023-03-17 DIAGNOSIS — M797 Fibromyalgia: Secondary | ICD-10-CM | POA: Diagnosis not present

## 2023-03-17 DIAGNOSIS — O0993 Supervision of high risk pregnancy, unspecified, third trimester: Secondary | ICD-10-CM | POA: Diagnosis not present

## 2023-03-19 DIAGNOSIS — O43213 Placenta accreta, third trimester: Secondary | ICD-10-CM | POA: Diagnosis not present

## 2023-03-20 DIAGNOSIS — O43213 Placenta accreta, third trimester: Secondary | ICD-10-CM | POA: Diagnosis not present

## 2023-03-24 DIAGNOSIS — Z302 Encounter for sterilization: Secondary | ICD-10-CM | POA: Diagnosis not present

## 2023-03-24 DIAGNOSIS — N2889 Other specified disorders of kidney and ureter: Secondary | ICD-10-CM | POA: Diagnosis not present

## 2023-03-24 DIAGNOSIS — O43213 Placenta accreta, third trimester: Secondary | ICD-10-CM | POA: Diagnosis not present

## 2023-03-24 DIAGNOSIS — O34219 Maternal care for unspecified type scar from previous cesarean delivery: Secondary | ICD-10-CM | POA: Diagnosis not present

## 2023-03-24 DIAGNOSIS — O321XX0 Maternal care for breech presentation, not applicable or unspecified: Secondary | ICD-10-CM | POA: Diagnosis not present

## 2023-03-24 DIAGNOSIS — Z03823 Encounter for observation for suspected inserted (injected) foreign body ruled out: Secondary | ICD-10-CM | POA: Diagnosis not present

## 2023-03-24 DIAGNOSIS — Z4682 Encounter for fitting and adjustment of non-vascular catheter: Secondary | ICD-10-CM | POA: Diagnosis not present

## 2023-03-24 DIAGNOSIS — Z3A35 35 weeks gestation of pregnancy: Secondary | ICD-10-CM | POA: Diagnosis not present

## 2023-03-24 DIAGNOSIS — K5900 Constipation, unspecified: Secondary | ICD-10-CM | POA: Diagnosis not present

## 2023-03-25 DIAGNOSIS — I745 Embolism and thrombosis of iliac artery: Secondary | ICD-10-CM | POA: Diagnosis not present

## 2023-03-25 DIAGNOSIS — Z9889 Other specified postprocedural states: Secondary | ICD-10-CM | POA: Diagnosis not present

## 2023-03-31 DIAGNOSIS — S3720XD Unspecified injury of bladder, subsequent encounter: Secondary | ICD-10-CM | POA: Diagnosis not present

## 2023-04-03 DIAGNOSIS — F419 Anxiety disorder, unspecified: Secondary | ICD-10-CM | POA: Diagnosis not present

## 2023-04-03 DIAGNOSIS — F32A Depression, unspecified: Secondary | ICD-10-CM | POA: Diagnosis not present

## 2023-05-01 DIAGNOSIS — F32A Depression, unspecified: Secondary | ICD-10-CM | POA: Diagnosis not present

## 2023-05-01 DIAGNOSIS — F419 Anxiety disorder, unspecified: Secondary | ICD-10-CM | POA: Diagnosis not present

## 2023-05-07 DIAGNOSIS — S3720XD Unspecified injury of bladder, subsequent encounter: Secondary | ICD-10-CM | POA: Diagnosis not present

## 2023-05-07 DIAGNOSIS — F4310 Post-traumatic stress disorder, unspecified: Secondary | ICD-10-CM | POA: Diagnosis not present

## 2023-05-07 DIAGNOSIS — I10 Essential (primary) hypertension: Secondary | ICD-10-CM | POA: Diagnosis not present

## 2023-05-07 DIAGNOSIS — O43213 Placenta accreta, third trimester: Secondary | ICD-10-CM | POA: Diagnosis not present

## 2023-05-16 IMAGING — CT CT RENAL STONE PROTOCOL
2 of 4 series · 16 of 46 positions shown, 18 images · non-contrast
Comparison: None.

CLINICAL DATA: Right flank pain.

EXAM:
CT ABDOMEN AND PELVIS WITHOUT CONTRAST
TECHNIQUE: Multidetector CT imaging of the abdomen and pelvis was performed
following the standard protocol without IV contrast.

[Series 3: stone study 5.0 i30f 2 · axial · 0.96mm/px · z∈[-508,-83]mm · 13 of 93 slices shown, 15 images]
[im 4/93  soft-tissue]
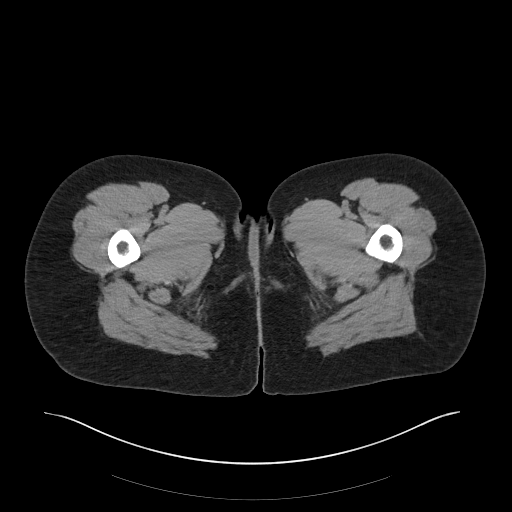
[im 4/93  bone]
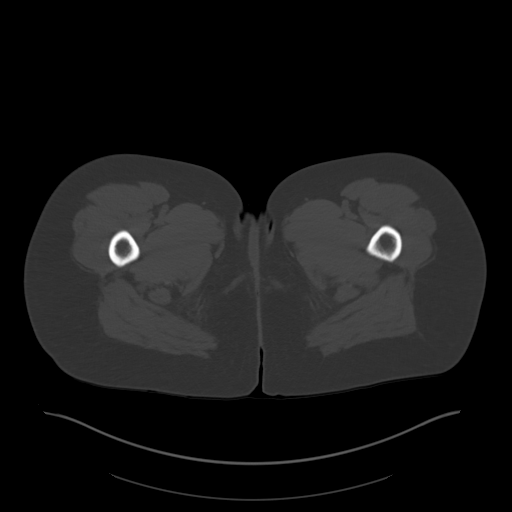
[im 12/93  soft-tissue]
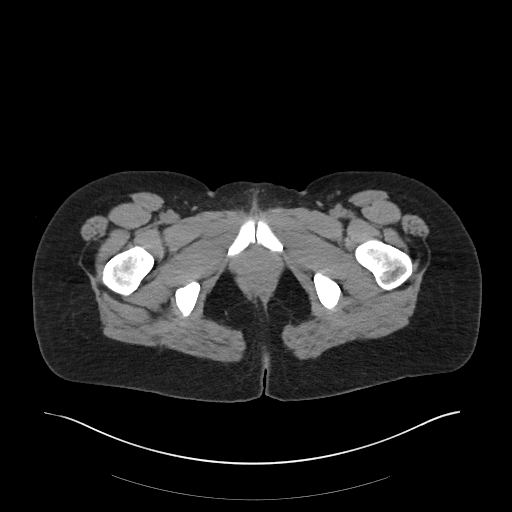
[im 20/93  soft-tissue]
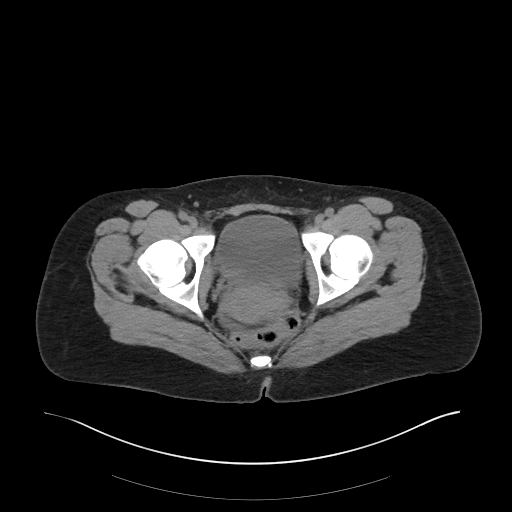
[im 27/93  soft-tissue]
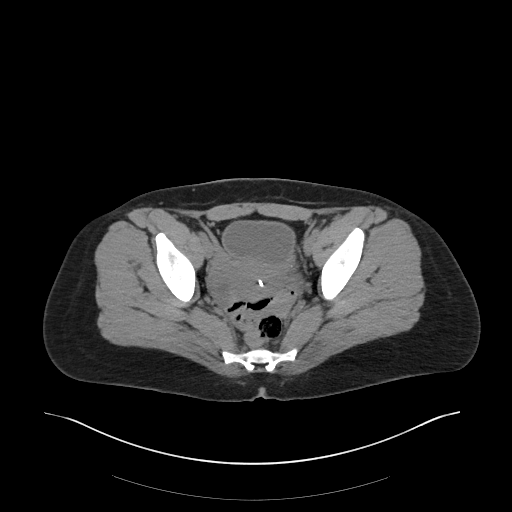
[im 31/93  soft-tissue]
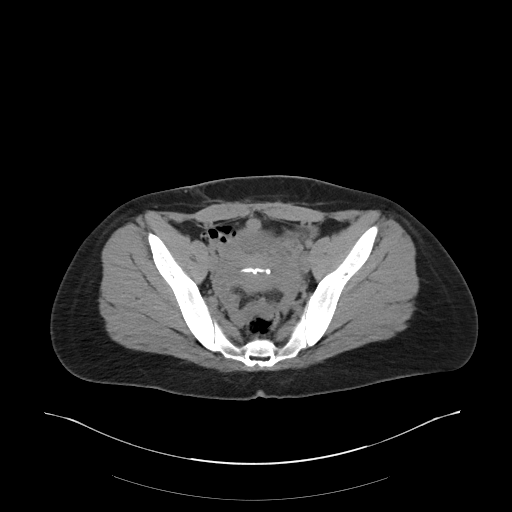
[im 39/93  soft-tissue]
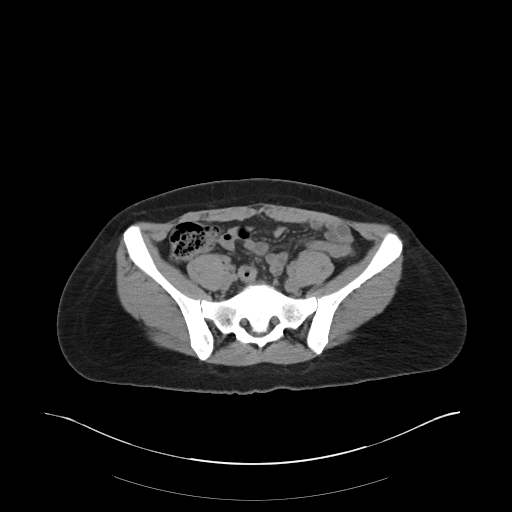
[im 47/93  soft-tissue]
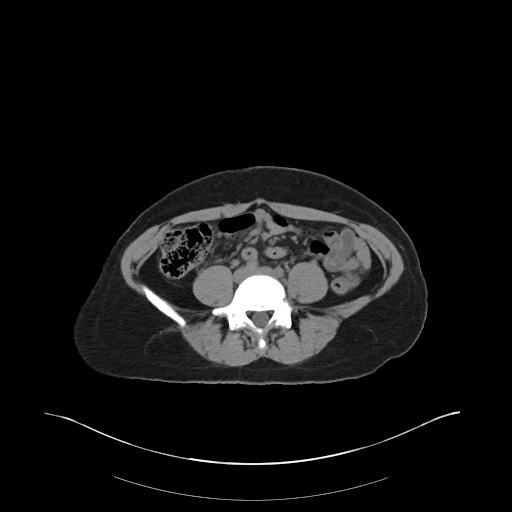
[im 54/93  soft-tissue]
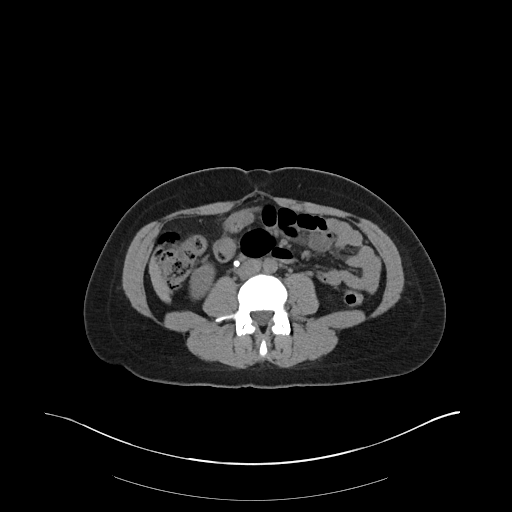
[im 62/93  soft-tissue]
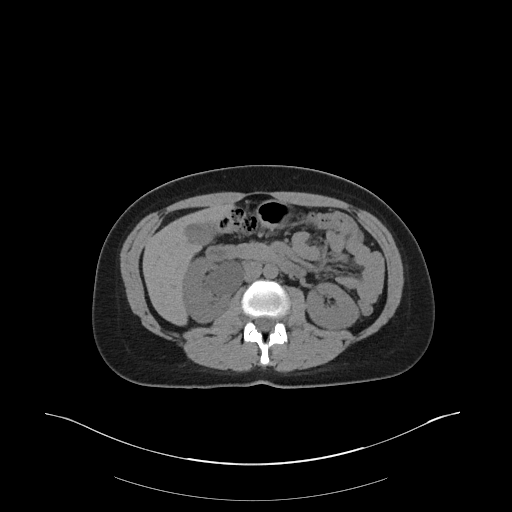
[im 62/93  bone]
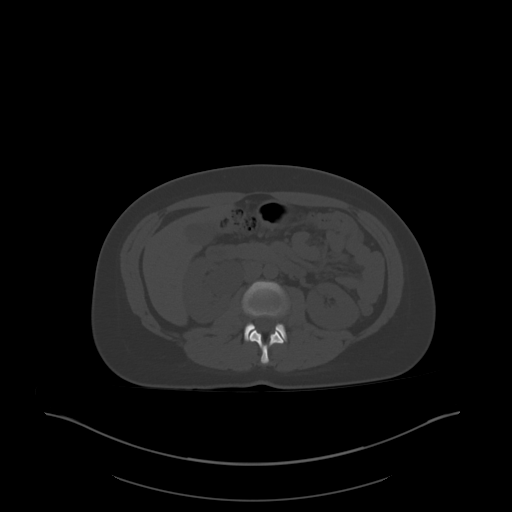
[im 66/93  soft-tissue]
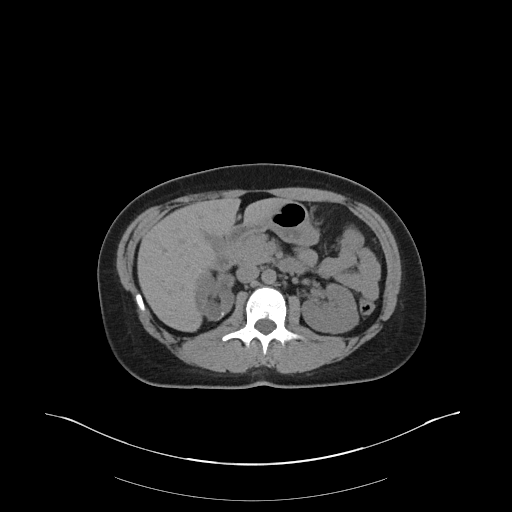
[im 73/93  soft-tissue]
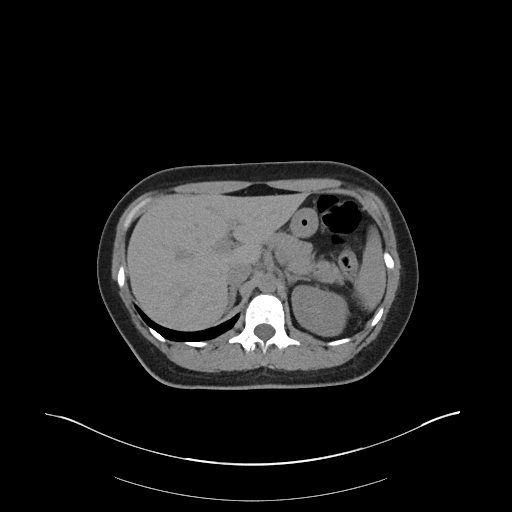
[im 81/93  soft-tissue]
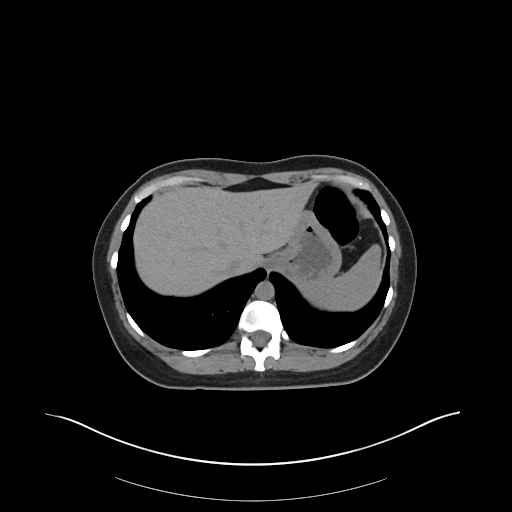
[im 89/93  soft-tissue]
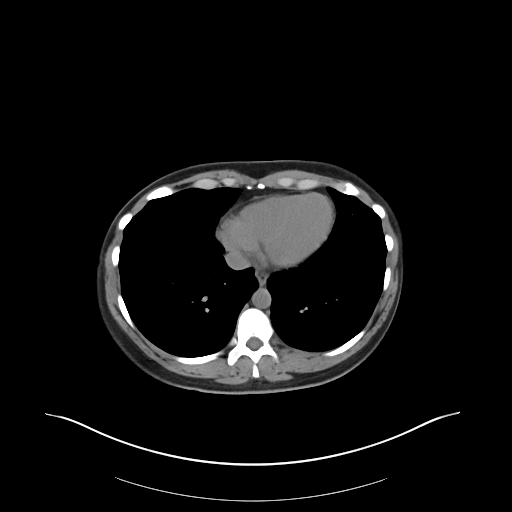

[Series 6: coronal soft tissue · coronal · 0.82mm/px · 3 of 85 slices shown]
[im 29/85  soft-tissue]
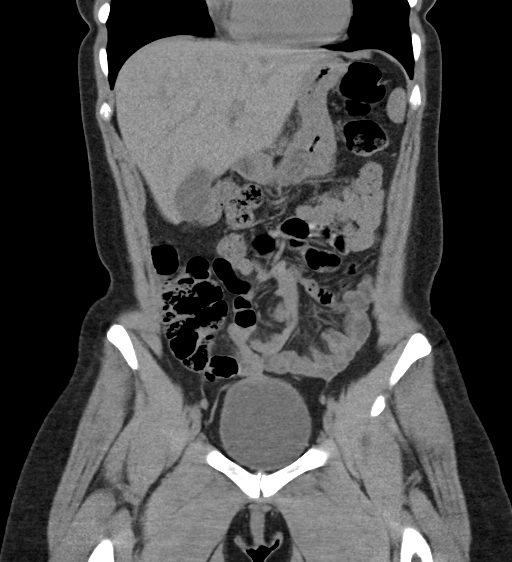
[im 38/85  soft-tissue]
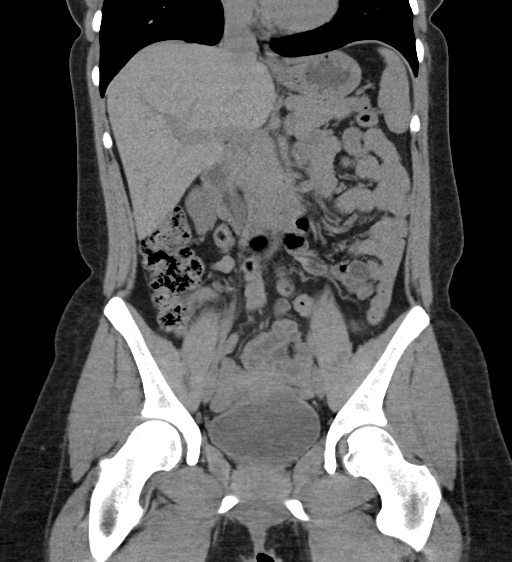
[im 47/85  soft-tissue]
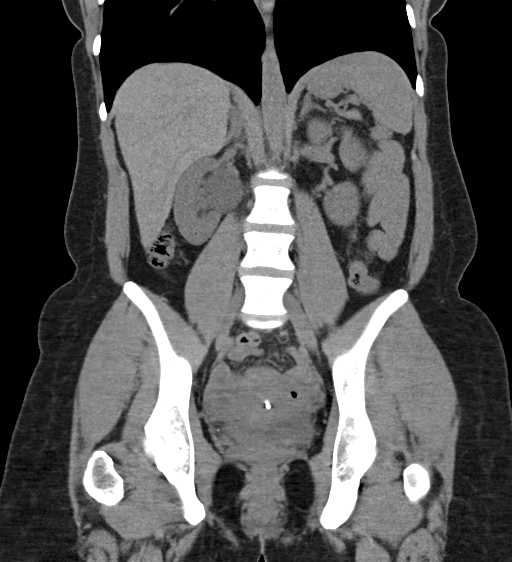

[16 of 46 positions shown; findings below may reference images not displayed]

FINDINGS: Lower chest: No acute abnormality.

Hepatobiliary: No focal liver abnormality is seen. No gallstones,
gallbladder wall thickening, or biliary dilatation.

Pancreas: Unremarkable. No pancreatic ductal dilatation or
surrounding inflammatory changes.

Spleen: Normal in size without focal abnormality.

Adrenals/Urinary Tract: Adrenal glands are unremarkable. Kidneys are
normal in size, without focal lesions. A 6 mm obstructing renal
stone is seen within the proximal right ureter with marked severity
right-sided hydronephrosis and hydroureter. Multiple subcentimeter
nonobstructing renal stones are seen along the periphery of a
dilated right upper pole renal calyx. Bladder is unremarkable.

Stomach/Bowel: Stomach is within normal limits. Appendix appears
normal. No evidence of bowel wall thickening, distention, or
inflammatory changes.

Vascular/Lymphatic: No significant vascular findings are present. No
enlarged abdominal or pelvic lymph nodes.

Reproductive: An IUD is seen within an otherwise normal appearing
uterus. The bilateral adnexa are unremarkable.

Other: A 2.2 cm x 1.9 cm fat-containing umbilical hernia is noted.
No abdominopelvic ascites.

Musculoskeletal: No acute or significant osseous findings.
IMPRESSION: 6 mm obstructing renal stone within the proximal right ureter.

## 2023-05-22 ENCOUNTER — Encounter: Payer: Self-pay | Admitting: Internal Medicine

## 2023-05-22 ENCOUNTER — Other Ambulatory Visit: Payer: Self-pay | Admitting: Internal Medicine

## 2023-05-22 ENCOUNTER — Ambulatory Visit: Payer: Medicaid Other | Attending: Internal Medicine | Admitting: Internal Medicine

## 2023-05-22 VITALS — BP 136/88 | HR 93 | Wt 198.6 lb

## 2023-05-22 DIAGNOSIS — M797 Fibromyalgia: Secondary | ICD-10-CM

## 2023-05-22 DIAGNOSIS — Z23 Encounter for immunization: Secondary | ICD-10-CM | POA: Diagnosis not present

## 2023-05-22 DIAGNOSIS — I10 Essential (primary) hypertension: Secondary | ICD-10-CM

## 2023-05-22 DIAGNOSIS — F53 Postpartum depression: Secondary | ICD-10-CM | POA: Diagnosis not present

## 2023-05-22 NOTE — Progress Notes (Signed)
Patient ID: Laurie Casey, female    DOB: [birth year]  MRN: [id number]  CC: Fibromyalgia   Subjective: Laurie Casey is a 37 y.o. female who presents for chronic ds management. Her concerns today include:  Patient with history of asthma, former smoker, anxiety disorder, migraines, preeclampsia, ureteral stone, PTSD/GAD/MDD (Followed by psychiatrist Joice Lofts, MD and therapist Nia at [facility]).   Last visit 08/11/2022:   Patient reports that she was pain-free after being on Cymbalta for 10 days and was doing well.  However she had a recent stressful event that has caused her anxiety and depression to flare a little.  Fired from her job this past Friday.  She was a Armed forces training and education officer.  She feels she was treated unfairly at the job and was given extra responsibilities without the proper training.  She feels she was pushed out because she was not a part of their click.  They made it out to seem like she walked off the job so now she is not eligible for unemployment.  She is stressed about this as she is a single mom of 3 young children.  She felt the job was not a good situation and was looking to change jobs.  She had a telephone interview last week and has been called for an in person interview which will occur later this week.  She is hopeful that she would get the position.  She tells me that she has a problem keeping her job and wonders whether she would qualify for partial disability. Since her pain has improved with the Cymbalta, she wonders whether she indeed has fibromyalgia.   She had been checking blood pressure on her job.  Range was 130-150/90s. Pt was started on Norvasc for BP.  Today:   Shortly after last visit, she found out she was pregnant.  Deliver a baby boy  several wks early on9/10/24.  Complicated by bladder injury.  Had hysterectomy, ovaries left in place.  Bladder repair. -she pumps, breast feed and uses infant formula.   HTN:  was taken  off Norvasc during pregnancy was on Labetalol 50 mg BID.  Saw OB at 6 wks check, BP was high.  They d/c Labetalol and restarted Norvasc.  Just got rxn yesterday; did not take as yet today.  Does have BP device.  She limits salt in foods.  Fibromyalgia/chronic pain:  was in constant pain and flare during her pregnancy.  Restarted Cymbalta Sept 1st on 30 mg once a day.  Had f/u with her psychiatrist around mid October and dose increased to twice a day because of some post-partum depression and uncontrolled fibromyalgia.  She is still on Wellbutrin.  Seroquel was stopped because it makes her sleep to sound to the point that she would not hear her infant when he wakes up.  Has f/u appt 05/28/23 with her psychiatrist.  Still feels she is all over the place psychologically.  Contributing to it is stress from her job.  She took only 8 weeks of maternity leave because she could not afford to be out 12 weeks.  She does not feel she could afford to work part-time either.  Does not have enough PTO.  Her partner helps with taking care of their infant through the night.  While at work, she gets 30 minutes for lunch and then 2 other 15-minute breaks.  She tries to pump milk during her break but has problems pumping.  She feels irritable 24/7.  Still  hurts some from the fibromyalgia and feels that her hands shake all the time.   HM:  yes to flu shot.  Decline COVID vac series.   Patient Active Problem List   Diagnosis Date Noted   Placenta accreta 12/03/2022   History of cesarean section 10/01/2022   Essential hypertension 08/11/2022   Obesity (BMI 30.0-34.9) 08/11/2022   High risk multigravida in third trimester 09/10/2017   Hx of preeclampsia, prior pregnancy, currently pregnant 03/16/2014     Current Outpatient Medications on File Prior to Visit  Medication Sig Dispense Refill   amLODipine (NORVASC) 5 MG tablet Take 5 mg by mouth daily.     aspirin EC 81 MG tablet Take 81 mg by mouth daily. Swallow whole.      buPROPion (WELLBUTRIN XL) 300 MG 24 hr tablet Take 300 mg by mouth every morning.     DULoxetine (CYMBALTA) 30 MG capsule Take 30 mg by mouth 2 (two) times daily.     Multiple Vitamin (MULTIVITAMIN) tablet Take 1 tablet by mouth daily.     No current facility-administered medications on file prior to visit.    Allergies  Allergen Reactions   Tape Rash    Adhesive    Social History   Socioeconomic History   Marital status: Significant Other    Spouse name: Not on file   Number of children: 3   Years of education: Not on file   Highest education level: Not on file  Occupational History   Occupation: Vet tech  Tobacco Use   Smoking status: Former    Current packs/day: 0.00    Types: Cigarettes    Quit date: 10/12/2020    Years since quitting: 2.6   Smokeless tobacco: Never   Tobacco comments:    off and on   Vaping Use   Vaping status: Never Used  Substance and Sexual Activity   Alcohol use: Not Currently   Drug use: Never   Sexual activity: Not on file  Other Topics Concern   Not on file  Social History Narrative   Not on file   Social Determinants of Health   Financial Resource Strain: Not on file  Food Insecurity: Not on file  Transportation Needs: Not on file  Physical Activity: Not on file  Stress: Not on file  Social Connections: Not on file  Intimate Partner Violence: Not on file    Family History  Problem Relation Age of Onset   Hypertension Father     Past Surgical History:  Procedure Laterality Date   CESAREAN SECTION  2013, 2015, 2019   x2   CYSTOSCOPY  2008 or 2009   CYSTOSCOPY W/ URETERAL STENT PLACEMENT Right 11/29/2020   Procedure: CYSTOSCOPY WITH RETROGRADE PYELOGRAM/URETERAL STENT PLACEMENT;  Surgeon: Marcine Matar, MD;  Location: [facility];  Service: Urology;  Laterality: Right;   CYSTOSCOPY/URETEROSCOPY/HOLMIUM LASER/STENT PLACEMENT Right 12/13/2020   Procedure: CYSTOSCOPY RIGHT URETEROSCOPY/HOLMIUM LASER/STENT EXTRACTION AND RIGHT JJ  STENT PLACEMENT, RIGHT RETROGRADE URETEROSCOPY;  Surgeon: Marcine Matar, MD;  Location: [facility];  Service: Urology;  Laterality: Right;   URETER SURGERY     x2   WISDOM TOOTH EXTRACTION      ROS: Review of Systems Negative except as stated above  PHYSICAL EXAM: BP 136/88   Pulse 93   Wt 198 lb 9.6 oz (90.1 kg)   LMP 07/19/2022 Comment: Had baby in September  SpO2 100%   Breastfeeding Unknown   BMI 33.05 kg/m   Physical Exam  General appearance - alert,  well appearing, young to middle-age Caucasian female and in no distress Mental status - normal mood, behavior, speech, dress, motor activity, and thought processes Chest - clear to auscultation, no wheezes, rales or rhonchi, symmetric air entry Heart - normal rate, regular rhythm, normal S1, S2, no murmurs, rubs, clicks or gallops Abdomen -she has a midline scar in the abdomen that appears to be healing well.  No signs of dehiscence     05/22/2023   12:01 PM 05/22/2023   11:47 AM 10/01/2022    8:50 AM  Depression screen PHQ 2/9  Decreased Interest 1 1 1   Down, Depressed, Hopeless 1 1 0  PHQ - 2 Score 2 2 1   Altered sleeping   0  Tired, decreased energy 0 0 2  Change in appetite 1 1 1   Feeling bad or failure about yourself  0 0 0  Trouble concentrating 1 1 0  Moving slowly or fidgety/restless 0 0 0  Suicidal thoughts 3 0 0  PHQ-9 Score   4      05/22/2023   11:47 AM 10/01/2022    8:50 AM 08/11/2022    3:15 PM 06/30/2022    3:09 PM  GAD 7 : Generalized Anxiety Score  Nervous, Anxious, on Edge 2 0 2 1  Control/stop worrying 0 0 0 2  Worry too much - different things 1 0 0 2  Trouble relaxing 1 0 1 1  Restless 0 0 0 1  Easily annoyed or irritable 3 1 2 2   Afraid - awful might happen 0 0 0 0  Total GAD 7 Score 7 1 5 9         Latest Ref Rng & Units 10/01/2022   10:04 AM 08/20/2022    8:30 AM 04/14/2022   10:18 AM  CMP  Glucose 70 - 99 mg/dL 88  99  409   BUN 6 - 20 mg/dL 11  11  9    Creatinine 0.57 - 1.00  mg/dL 8.11  9.14  7.82   Sodium 134 - 144 mmol/L 137  134  139   Potassium 3.5 - 5.2 mmol/L 4.2  3.7  4.1   Chloride 96 - 106 mmol/L 106  102  108   CO2 20 - 29 mmol/L 16  23  21    Calcium 8.7 - 10.2 mg/dL 9.6  9.2  9.7   Total Protein 6.0 - 8.5 g/dL 6.7  6.8    Total Bilirubin 0.0 - 1.2 mg/dL 0.3  0.3    Alkaline Phos 44 - 121 IU/L 74  57    AST 0 - 40 IU/L 23  29    ALT 0 - 32 IU/L 16  27     Lipid Panel  No results found for: "CHOL", "TRIG", "HDL", "CHOLHDL", "VLDL", "LDLCALC", "LDLDIRECT"  CBC    Component Value Date/Time   WBC 14.0 (H) 10/01/2022 1004   WBC 5.4 08/20/2022 0830   RBC 4.19 10/01/2022 1004   RBC 4.07 08/20/2022 0830   HGB 13.4 10/01/2022 1004   HCT 39.4 10/01/2022 1004   PLT 169 10/01/2022 1004   MCV 94 10/01/2022 1004   MCH 32.0 10/01/2022 1004   MCH 31.0 08/20/2022 0830   MCHC 34.0 10/01/2022 1004   MCHC 33.6 08/20/2022 0830   RDW 12.9 10/01/2022 1004   LYMPHSABS 2.4 10/01/2022 1004   MONOABS 1.3 (H) 08/20/2022 0830   EOSABS 0.3 10/01/2022 1004   BASOSABS 0.1 10/01/2022 1004    ASSESSMENT AND PLAN:  1. Essential  hypertension Repeat blood pressure improved but not at goal.  She plans to pick up the Norvasc and start taking today.  Advised to check blood pressure at least twice a week with goal being 130/80 or lower.  After several weeks she can let me know via MyChart what her blood pressure readings are doing.  2. Fibromyalgia Patient to continue with Cymbalta  3. Post partum depression Plugged in with behavioral health.  She plans to continue following up  4. Need for immunization against influenza - Flu vaccine trivalent PF, 6mos and older(Flulaval,Afluria,Fluarix,Fluzone)        There are no diagnoses linked to this encounter.   Patient was given the opportunity to ask questions.  Patient verbalized understanding of the plan and was able to repeat key elements of the plan.   This documentation was completed using Social research officer, government.  Any transcriptional errors are unintentional.  Orders Placed This Encounter  Procedures   Flu vaccine trivalent PF, 6mos and older(Flulaval,Afluria,Fluarix,Fluzone)     Requested Prescriptions    No prescriptions requested or ordered in this encounter    Return in about 4 months (around 09/19/2023).  Jonah Blue, MD, FACP

## 2023-05-28 DIAGNOSIS — F32A Depression, unspecified: Secondary | ICD-10-CM | POA: Diagnosis not present

## 2023-05-28 DIAGNOSIS — F419 Anxiety disorder, unspecified: Secondary | ICD-10-CM | POA: Diagnosis not present

## 2023-06-15 DIAGNOSIS — F419 Anxiety disorder, unspecified: Secondary | ICD-10-CM | POA: Diagnosis not present

## 2023-06-15 DIAGNOSIS — F32A Depression, unspecified: Secondary | ICD-10-CM | POA: Diagnosis not present

## 2023-06-24 DIAGNOSIS — F419 Anxiety disorder, unspecified: Secondary | ICD-10-CM | POA: Diagnosis not present

## 2023-06-24 DIAGNOSIS — F32A Depression, unspecified: Secondary | ICD-10-CM | POA: Diagnosis not present

## 2023-07-01 DIAGNOSIS — F419 Anxiety disorder, unspecified: Secondary | ICD-10-CM | POA: Diagnosis not present

## 2023-07-01 DIAGNOSIS — F4310 Post-traumatic stress disorder, unspecified: Secondary | ICD-10-CM | POA: Diagnosis not present

## 2023-07-01 DIAGNOSIS — F32A Depression, unspecified: Secondary | ICD-10-CM | POA: Diagnosis not present

## 2023-07-22 DIAGNOSIS — F4310 Post-traumatic stress disorder, unspecified: Secondary | ICD-10-CM | POA: Diagnosis not present

## 2023-07-22 DIAGNOSIS — F32A Depression, unspecified: Secondary | ICD-10-CM | POA: Diagnosis not present

## 2023-07-22 DIAGNOSIS — F419 Anxiety disorder, unspecified: Secondary | ICD-10-CM | POA: Diagnosis not present

## 2023-07-23 ENCOUNTER — Ambulatory Visit: Payer: Medicaid Other | Attending: Internal Medicine | Admitting: Internal Medicine

## 2023-07-23 ENCOUNTER — Encounter: Payer: Self-pay | Admitting: Internal Medicine

## 2023-07-23 VITALS — BP 132/93 | HR 91 | Temp 97.9°F | Ht 65.0 in | Wt 200.0 lb

## 2023-07-23 DIAGNOSIS — F330 Major depressive disorder, recurrent, mild: Secondary | ICD-10-CM

## 2023-07-23 DIAGNOSIS — R251 Tremor, unspecified: Secondary | ICD-10-CM

## 2023-07-23 DIAGNOSIS — R29898 Other symptoms and signs involving the musculoskeletal system: Secondary | ICD-10-CM

## 2023-07-23 DIAGNOSIS — I10 Essential (primary) hypertension: Secondary | ICD-10-CM | POA: Diagnosis not present

## 2023-07-23 NOTE — Progress Notes (Signed)
 Patient ID: Laurie Casey, female    DOB: Jun 15, 1986  MRN: [id number]  CC: Hypertension (HTN f/u. /Intermittent weakness in the extremities X8 mo/Concern hair loss/Already received flu vax)   Subjective: Laurie Casey is a 38 y.o. female who presents for chronic ds management. Her concerns today include:  Patient with history of asthma, former smoker, anxiety disorder, migraines, preeclampsia, ureteral stone, PTSD/GAD/MDD (Followed by psychiatrist Prentice Orn, MD and therapist Nia at [facility]).   Discussed the use of AI scribe software for clinical note transcription with the patient, who gave verbal consent to proceed.  History of Present Illness   The patient, with a history of fibromyalgia, presents with intermittent weakness in the  4 extremities but more so in the right hand, which has been ongoing for approximately eight months. The patient also reports intermittent shaking in the right hand. The symptoms are sporadic and do not appear to be triggered by any specific factors. The patient has attempted to identify any patterns or triggers but has found no correlation. The patient suspects that these symptoms may be related to her fibromyalgia. The patient is considering alternative treatments such as acupuncture or chiropractic care.   She is plugged in with a psychiatrist and a counselor for depression.  She feels she is doing better on the Cymbalta.  She walks on her treadmill 5 days a week.  When she came to see me 1 year ago as a new patient, she reported that she had seen a neurologist in the past and had EMG of the upper and lower extremities that was negative.  She had also seen a pain specialist who gave some injections to the upper back which she felt helped at the time.  MRI of the cervical and thoracic spine was done by the specialist.  It showed small disc bulge at T6-T7 without stenosis and very mild degenerative changes of the cervical spine.   She denies any significant neck or arm pain. Instead, the patient describes a sensation of tiredness, weakness, and burning, often feeling as though the nerves are exposed. This sensation is particularly prominent across the right shoulder blade and into the neck.  She tells me that she is applying for Social Security disability and wanted to make me aware of it.  States that she finds it difficult working with everything that is going on.  HTN: Blood pressure is elevated today.  She has not taken amlodipine  as yet for the day. Patient Active Problem List   Diagnosis Date Noted   Placenta accreta 12/03/2022   History of cesarean section 10/01/2022   Essential hypertension 08/11/2022   Obesity (BMI 30.0-34.9) 08/11/2022   High risk multigravida in third trimester 09/10/2017   Hx of preeclampsia, prior pregnancy, currently pregnant 03/16/2014     Current Outpatient Medications on File Prior to Visit  Medication Sig Dispense Refill   amLODipine  (NORVASC ) 5 MG tablet Take 5 mg by mouth daily.     buPROPion (WELLBUTRIN XL) 300 MG 24 hr tablet Take 300 mg by mouth every morning.     DULoxetine (CYMBALTA) 30 MG capsule Take 30 mg by mouth 2 (two) times daily.     Multiple Vitamin (MULTIVITAMIN) tablet Take 1 tablet by mouth daily.     aspirin EC 81 MG tablet Take 81 mg by mouth daily. Swallow whole. (Patient not taking: Reported on 07/23/2023)     No current facility-administered medications on file prior to visit.    Allergies  Allergen Reactions   Tape Rash    Adhesive    Social History   Socioeconomic History   Marital status: Significant Other    Spouse name: Not on file   Number of children: 3   Years of education: Not on file   Highest education level: Not on file  Occupational History   Occupation: Vet tech  Tobacco Use   Smoking status: Former    Current packs/day: 0.00    Types: Cigarettes    Quit date: 10/12/2020    Years since quitting: 2.7   Smokeless tobacco: Never    Tobacco comments:    off and on   Vaping Use   Vaping status: Never Used  Substance and Sexual Activity   Alcohol use: Not Currently   Drug use: Never   Sexual activity: Not on file  Other Topics Concern   Not on file  Social History Narrative   Not on file   Social Drivers of Health   Financial Resource Strain: Low Risk  (07/23/2023)   Overall Financial Resource Strain (CARDIA)    Difficulty of Paying Living Expenses: Not hard at all  Food Insecurity: No Food Insecurity (07/23/2023)   Hunger Vital Sign    Worried About Running Out of Food in the Last Year: Never true    Ran Out of Food in the Last Year: Never true  Transportation Needs: No Transportation Needs (07/23/2023)   PRAPARE - Administrator, Civil Service (Medical): No    Lack of Transportation (Non-Medical): No  Physical Activity: Sufficiently Active (07/23/2023)   Exercise Vital Sign    Days of Exercise per Week: 5 days    Minutes of Exercise per Session: 40 min  Stress: Stress Concern Present (07/23/2023)   [employer] of Occupational Health - Occupational Stress Questionnaire    Feeling of Stress : Very much  Social Connections: Socially Isolated (07/23/2023)   Social Connection and Isolation Panel [NHANES]    Frequency of Communication with Friends and Family: Never    Frequency of Social Gatherings with Friends and Family: Never    Attends Religious Services: Never    Database Administrator or Organizations: No    Attends Banker Meetings: Never    Marital Status: Living with partner  Intimate Partner Violence: Not At Risk (07/23/2023)   Humiliation, Afraid, Rape, and Kick questionnaire    Fear of Current or Ex-Partner: No    Emotionally Abused: No    Physically Abused: No    Sexually Abused: No    Family History  Problem Relation Age of Onset   Hypertension Father     Past Surgical History:  Procedure Laterality Date   CESAREAN SECTION  2013, 2015, 2019   x2   CYSTOSCOPY   2008 or 2009   CYSTOSCOPY W/ URETERAL STENT PLACEMENT Right 11/29/2020   Procedure: CYSTOSCOPY WITH RETROGRADE PYELOGRAM/URETERAL STENT PLACEMENT;  Surgeon: Matilda Senior, MD;  Location: [facility] OR;  Service: Urology;  Laterality: Right;   CYSTOSCOPY/URETEROSCOPY/HOLMIUM LASER/STENT PLACEMENT Right 12/13/2020   Procedure: CYSTOSCOPY RIGHT URETEROSCOPY/HOLMIUM LASER/STENT EXTRACTION AND RIGHT JJ STENT PLACEMENT, RIGHT RETROGRADE URETEROSCOPY;  Surgeon: Matilda Senior, MD;  Location: [facility];  Service: Urology;  Laterality: Right;   URETER SURGERY     x2   WISDOM TOOTH EXTRACTION      ROS: Review of Systems Negative except as stated above  PHYSICAL EXAM: BP (!) 132/93 (BP Location: Left Arm, Patient Position: Sitting, Cuff Size: Normal)   Pulse 91  Temp 97.9 F (36.6 C) (Oral)   Ht 5' 5 (1.651 m)   Wt 200 lb (90.7 kg)   SpO2 100%   BMI 33.28 kg/m   Physical Exam  General appearance - alert, well appearing, and in no distress Mental status - normal mood, behavior, speech, dress, motor activity, and thought processes Neurological -power including grip 5/5 bilaterally proximally and distally in both the upper and lower extremities.  Gross sensation intact in the extremities.  Gait appears normal.  No tremors noted in either hand at this time.  No wasting of intrinsic muscle of the hands.  No wasting of muscles in the upper and lower extremities.     07/23/2023    1:32 PM 05/22/2023   12:01 PM 05/22/2023   11:47 AM  Depression screen PHQ 2/9  Decreased Interest 1 1 1   Down, Depressed, Hopeless 1 1 1   PHQ - 2 Score 2 2 2   Altered sleeping 0    Tired, decreased energy 2 0 0  Change in appetite 0 1 1  Feeling bad or failure about yourself  0 0 0  Trouble concentrating 1 1 1   Moving slowly or fidgety/restless 0 0 0  Suicidal thoughts 0 3 0  PHQ-9 Score 5    Difficult doing work/chores Somewhat difficult        07/23/2023    1:33 PM 05/22/2023   11:47 AM 10/01/2022    8:50 AM  08/11/2022    3:15 PM  GAD 7 : Generalized Anxiety Score  Nervous, Anxious, on Edge 1 2 0 2  Control/stop worrying 0 0 0 0  Worry too much - different things 0 1 0 0  Trouble relaxing 0 1 0 1  Restless 1 0 0 0  Easily annoyed or irritable 1 3 1 2   Afraid - awful might happen 0 0 0 0  Total GAD 7 Score 3 7 1 5   Anxiety Difficulty Somewhat difficult           Latest Ref Rng & Units 10/01/2022   10:04 AM 08/20/2022    8:30 AM 04/14/2022   10:18 AM  CMP  Glucose 70 - 99 mg/dL 88  99  898   BUN 6 - 20 mg/dL 11  11  9    Creatinine 0.57 - 1.00 mg/dL 9.27  8.96  9.04   Sodium 134 - 144 mmol/L 137  134  139   Potassium 3.5 - 5.2 mmol/L 4.2  3.7  4.1   Chloride 96 - 106 mmol/L 106  102  108   CO2 20 - 29 mmol/L 16  23  21    Calcium 8.7 - 10.2 mg/dL 9.6  9.2  9.7   Total Protein 6.0 - 8.5 g/dL 6.7  6.8    Total Bilirubin 0.0 - 1.2 mg/dL 0.3  0.3    Alkaline Phos 44 - 121 IU/L 74  57    AST 0 - 40 IU/L 23  29    ALT 0 - 32 IU/L 16  27     Lipid Panel  No results found for: CHOL, TRIG, HDL, CHOLHDL, VLDL, LDLCALC, LDLDIRECT  CBC    Component Value Date/Time   WBC 14.0 (H) 10/01/2022 1004   WBC 5.4 08/20/2022 0830   RBC 4.19 10/01/2022 1004   RBC 4.07 08/20/2022 0830   HGB 13.4 10/01/2022 1004   HCT 39.4 10/01/2022 1004   PLT 169 10/01/2022 1004   MCV 94 10/01/2022 1004   MCH 32.0 10/01/2022 1004  MCH 31.0 08/20/2022 0830   MCHC 34.0 10/01/2022 1004   MCHC 33.6 08/20/2022 0830   RDW 12.9 10/01/2022 1004   LYMPHSABS 2.4 10/01/2022 1004   MONOABS 1.3 (H) 08/20/2022 0830   EOSABS 0.3 10/01/2022 1004   BASOSABS 0.1 10/01/2022 1004    ASSESSMENT AND PLAN: 1. Weakness of both lower extremities (Primary) 2. Weakness of both hands Patient presenting with intermittent weakness in both upper and lower extremities but more so in the right hand.  Neurologic exam today reveals a normal grip and strength in all 4 extremities.  No tremor noted in the hand at this time.   She reports having had EMG done in the past along with MRI of the cervical spine.  We will refer to neurology for further evaluation.  If everything checks out okay they are, then she can try complementary and alternative medicine like acupuncture that she was considering. - Ambulatory referral to Neurology  3. Tremor of right hand See #1 above. - Ambulatory referral to Neurology  4. Essential hypertension Not at goal.  She has not taken her medicine as yet for the morning.  She will take amlodipine  when she returns home.  5. Mild recurrent major depression (HCC) Plugged in with behavioral health.  Doing better on Cymbalta.  It seems that the regular exercise is also helping. Advised that if she is considering applying for disability, she would need to see whether her psychiatrist thinks from their standpoint of view she meets criteria.  Patient was given the opportunity to ask questions.  Patient verbalized understanding of the plan and was able to repeat key elements of the plan.   This documentation was completed using Paediatric nurse.  Any transcriptional errors are unintentional.  Orders Placed This Encounter  Procedures   Ambulatory referral to Neurology     Requested Prescriptions    No prescriptions requested or ordered in this encounter    Return in about 4 months (around 11/20/2023).  Barnie Louder, MD, FACP

## 2023-07-29 DIAGNOSIS — F419 Anxiety disorder, unspecified: Secondary | ICD-10-CM | POA: Diagnosis not present

## 2023-07-29 DIAGNOSIS — F4310 Post-traumatic stress disorder, unspecified: Secondary | ICD-10-CM | POA: Diagnosis not present

## 2023-07-29 DIAGNOSIS — F32A Depression, unspecified: Secondary | ICD-10-CM | POA: Diagnosis not present

## 2023-07-31 ENCOUNTER — Encounter: Payer: Self-pay | Admitting: Internal Medicine

## 2023-08-05 DIAGNOSIS — F4310 Post-traumatic stress disorder, unspecified: Secondary | ICD-10-CM | POA: Diagnosis not present

## 2023-08-05 DIAGNOSIS — F32A Depression, unspecified: Secondary | ICD-10-CM | POA: Diagnosis not present

## 2023-08-05 DIAGNOSIS — F419 Anxiety disorder, unspecified: Secondary | ICD-10-CM | POA: Diagnosis not present

## 2023-08-06 ENCOUNTER — Telehealth (HOSPITAL_BASED_OUTPATIENT_CLINIC_OR_DEPARTMENT_OTHER): Payer: Medicaid Other | Admitting: Internal Medicine

## 2023-08-06 DIAGNOSIS — Z029 Encounter for administrative examinations, unspecified: Secondary | ICD-10-CM | POA: Diagnosis not present

## 2023-08-06 DIAGNOSIS — M797 Fibromyalgia: Secondary | ICD-10-CM | POA: Diagnosis not present

## 2023-08-06 DIAGNOSIS — L309 Dermatitis, unspecified: Secondary | ICD-10-CM | POA: Diagnosis not present

## 2023-08-06 DIAGNOSIS — F32A Depression, unspecified: Secondary | ICD-10-CM

## 2023-08-06 DIAGNOSIS — F419 Anxiety disorder, unspecified: Secondary | ICD-10-CM

## 2023-08-06 NOTE — Progress Notes (Signed)
Patient ID: Laurie Casey, female   DOB: [birth year], 38 y.o.   MRN: [id number] Virtual Visit via Video Note  I connected with Emeterio Reeve on 08/06/2023 at 8:08 AM by a video enabled telemedicine application and verified that I am speaking with the correct person using two identifiers.  Location: Patient: Patient is in her car that is parked outside of her workplace Provider: Office   I discussed the limitations of evaluation and management by telemedicine and the availability of in person appointments. The patient expressed understanding and agreed to proceed.  History of Present Illness: Patient with history of asthma, former smoker, anxiety disorder, migraines, preeclampsia, ureteral stone, PTSD/GAD/MDD (Followed by psychiatrist Joice Lofts, MD and therapist Nia at [facility]).    Pt last seen 07/23/2023 with intermittent weakness in all 4 extremities with RT hand being worse.  Referred to neurology.  I subsequently received FMLA form.  This visit is to address her request for FMLA.  Patient is requesting FMLA because of her fibromyalgia.  She tells me that on average she she has been out of work about 4-5 times a month because some days she cannot get out of bed due to pain.  Most of the time she states that her pain is under control.  She is currently on Cymbalta 30 mg twice a day and recently started on gabapentin 300 mg twice a day 1-1/2 months ago by her psychiatrist.  The gabapentin was started to help with her anxiety but she knows that it also helps with fibromyalgia.  She states that her psychiatrist told her that he cannot treat her fibromyalgia but he has selected medications like the Cymbalta and gabapentin that will help with it.  Reports developing a rash on her back 8 days ago that she felt was brought on by a flare of her fibromyalgia because she got upset at work that day.  She has been using some over-the-counter cream and the rash is starting to  clear.  It was not itchy.  It felt initially like a little knives were being driven into her back.  She had been swimming 5 days a week for exercise but lately she has only gone 1 day a week because the chlorine was irritating the rash.  Outpatient Encounter Medications as of 08/06/2023  Medication Sig   amLODipine (NORVASC) 5 MG tablet Take 5 mg by mouth daily.   aspirin EC 81 MG tablet Take 81 mg by mouth daily. Swallow whole. (Patient not taking: Reported on 07/23/2023)   buPROPion (WELLBUTRIN XL) 300 MG 24 hr tablet Take 300 mg by mouth every morning.   DULoxetine (CYMBALTA) 30 MG capsule Take 30 mg by mouth 2 (two) times daily.   Multiple Vitamin (MULTIVITAMIN) tablet Take 1 tablet by mouth daily.   No facility-administered encounter medications on file as of 08/06/2023.    Observations/Objective: Middle-age Caucasian female in NAD  Assessment and Plan: 1. Administrative encounter (Primary) 2. Fibromyalgia 3. Anxiety and depression 4. Dermatitis -I will complete FMLA form for her requesting intermittent leave 1 to 4 days a month as needed. In regards to her fibromyalgia, advised that she is being managed for fibromyalgia even if it is not with the rheumatologist.  Management include use of graded exercise program, medication to help decrease centralized pain, management of any accompanying underlying mental health issues and getting adequate sleep.  Encouraged her that now that the rash that she reports on her back is resolving, she should increase  days of pool exercise to at least 3 days a week.  Continue Cymbalta and gabapentin that are being prescribed through her psychiatrist.   Follow Up Instructions: As previously schedule.   I discussed the assessment and treatment plan with the patient. The patient was provided an opportunity to ask questions and all were answered. The patient agreed with the plan and demonstrated an understanding of the instructions.   The patient was advised  to call back or seek an in-person evaluation if the symptoms worsen or if the condition fails to improve as anticipated.  I spent 12 minutes dedicated to the care of this patient on the date of this encounter to include previsit review of my last note, face-to-face time with patient discussing diagnosis and management  This note has been created with Education officer, environmental. Any transcriptional errors are unintentional.  Jonah Blue, MD

## 2023-08-17 ENCOUNTER — Telehealth: Payer: Self-pay

## 2023-08-17 NOTE — Telephone Encounter (Signed)
Copied from CRM [id number]. Topic: General - Other >> Aug 17, 2023 12:05 PM Suzette B wrote: Reason for CRM: Lurena Joiner of [employer], has called to check status of Intermittent leave they are needing clarification in order to complete the paperwork. [phone]

## 2023-08-18 NOTE — Telephone Encounter (Signed)
Intermittent leave paperwork received and placed in provider box for signature.

## 2023-08-19 DIAGNOSIS — F32A Depression, unspecified: Secondary | ICD-10-CM | POA: Diagnosis not present

## 2023-08-19 DIAGNOSIS — F4310 Post-traumatic stress disorder, unspecified: Secondary | ICD-10-CM | POA: Diagnosis not present

## 2023-08-19 DIAGNOSIS — F419 Anxiety disorder, unspecified: Secondary | ICD-10-CM | POA: Diagnosis not present

## 2023-08-19 NOTE — Telephone Encounter (Signed)
 Phone call returned to [place] at [employer] regarding ADA form sent to me.  I left a message with her office requesting a call back.  I need to know why I am being asked to complete this form after I have already submitted an FMLA request form for this pt last wk.  Patient is not requesting an ADA ACCOMMODATION. I left my cell phone but also the office # and your name, Clarisa, in the event she calls back.  Please find out this information and if they insist that this still needs to be completed, let her know it will be ready 08/24/2023.

## 2023-08-19 NOTE — Telephone Encounter (Signed)
 Noted

## 2023-08-20 DIAGNOSIS — F4310 Post-traumatic stress disorder, unspecified: Secondary | ICD-10-CM | POA: Diagnosis not present

## 2023-08-20 DIAGNOSIS — F419 Anxiety disorder, unspecified: Secondary | ICD-10-CM | POA: Diagnosis not present

## 2023-08-20 DIAGNOSIS — F32A Depression, unspecified: Secondary | ICD-10-CM | POA: Diagnosis not present

## 2023-08-21 ENCOUNTER — Encounter: Payer: Self-pay | Admitting: Internal Medicine

## 2023-08-21 NOTE — Telephone Encounter (Signed)
 Additional fax from [employer] received requesting clarification on duration of how long patient will need intermittent leave. Form successfully faxed back to [employer] on 08/21/2023. Additional response faxed with attention to Laurie Casey Specialist, requesting further clarification of ADA form completion. Awaiting call back from [place]. Please see thread below for additional information.

## 2023-08-26 ENCOUNTER — Encounter: Payer: Self-pay | Admitting: Internal Medicine

## 2023-08-26 DIAGNOSIS — F32A Depression, unspecified: Secondary | ICD-10-CM | POA: Diagnosis not present

## 2023-08-26 DIAGNOSIS — F4310 Post-traumatic stress disorder, unspecified: Secondary | ICD-10-CM | POA: Diagnosis not present

## 2023-08-26 DIAGNOSIS — F419 Anxiety disorder, unspecified: Secondary | ICD-10-CM | POA: Diagnosis not present

## 2023-09-04 DIAGNOSIS — N132 Hydronephrosis with renal and ureteral calculous obstruction: Secondary | ICD-10-CM | POA: Diagnosis not present

## 2023-09-04 DIAGNOSIS — R8271 Bacteriuria: Secondary | ICD-10-CM | POA: Diagnosis not present

## 2023-09-04 DIAGNOSIS — N10 Acute tubulo-interstitial nephritis: Secondary | ICD-10-CM | POA: Diagnosis not present

## 2023-09-15 ENCOUNTER — Encounter: Payer: Self-pay | Admitting: Internal Medicine

## 2023-09-16 DIAGNOSIS — F4310 Post-traumatic stress disorder, unspecified: Secondary | ICD-10-CM | POA: Diagnosis not present

## 2023-09-16 DIAGNOSIS — F32A Depression, unspecified: Secondary | ICD-10-CM | POA: Diagnosis not present

## 2023-09-16 DIAGNOSIS — F419 Anxiety disorder, unspecified: Secondary | ICD-10-CM | POA: Diagnosis not present

## 2023-09-17 DIAGNOSIS — F32A Depression, unspecified: Secondary | ICD-10-CM | POA: Diagnosis not present

## 2023-09-17 DIAGNOSIS — F4310 Post-traumatic stress disorder, unspecified: Secondary | ICD-10-CM | POA: Diagnosis not present

## 2023-09-17 DIAGNOSIS — F419 Anxiety disorder, unspecified: Secondary | ICD-10-CM | POA: Diagnosis not present

## 2023-09-21 ENCOUNTER — Encounter: Payer: Self-pay | Admitting: Internal Medicine

## 2023-09-22 ENCOUNTER — Other Ambulatory Visit: Payer: Self-pay | Admitting: Internal Medicine

## 2023-09-22 DIAGNOSIS — M797 Fibromyalgia: Secondary | ICD-10-CM

## 2023-09-24 ENCOUNTER — Ambulatory Visit: Admitting: Physician Assistant

## 2023-09-30 DIAGNOSIS — F32A Depression, unspecified: Secondary | ICD-10-CM | POA: Diagnosis not present

## 2023-09-30 DIAGNOSIS — F419 Anxiety disorder, unspecified: Secondary | ICD-10-CM | POA: Diagnosis not present

## 2023-09-30 DIAGNOSIS — F4310 Post-traumatic stress disorder, unspecified: Secondary | ICD-10-CM | POA: Diagnosis not present

## 2023-10-01 ENCOUNTER — Encounter: Payer: Self-pay | Admitting: Physical Medicine and Rehabilitation

## 2023-10-12 ENCOUNTER — Other Ambulatory Visit: Payer: Self-pay

## 2023-10-12 ENCOUNTER — Ambulatory Visit: Attending: Internal Medicine | Admitting: Physical Therapy

## 2023-10-12 ENCOUNTER — Encounter: Payer: Self-pay | Admitting: Physical Therapy

## 2023-10-12 DIAGNOSIS — R2681 Unsteadiness on feet: Secondary | ICD-10-CM | POA: Insufficient documentation

## 2023-10-12 DIAGNOSIS — M6281 Muscle weakness (generalized): Secondary | ICD-10-CM | POA: Insufficient documentation

## 2023-10-12 DIAGNOSIS — M797 Fibromyalgia: Secondary | ICD-10-CM | POA: Insufficient documentation

## 2023-10-12 NOTE — Progress Notes (Signed)
   10/12/23 0001  Standardized Balance Assessment  Standardized Balance Assessment Dynamic Gait Index  Dynamic Gait Index  Level Surface 2  Change in Gait Speed 2  Gait with Horizontal Head Turns 2  Gait with Vertical Head Turns 2  Gait and Pivot Turn 1  Step Over Obstacle 2  Step Around Obstacles 2  Steps 1  Total Score 14
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
## 2023-10-14 DIAGNOSIS — F4310 Post-traumatic stress disorder, unspecified: Secondary | ICD-10-CM | POA: Diagnosis not present

## 2023-10-14 DIAGNOSIS — F32A Depression, unspecified: Secondary | ICD-10-CM | POA: Diagnosis not present

## 2023-10-14 DIAGNOSIS — F419 Anxiety disorder, unspecified: Secondary | ICD-10-CM | POA: Diagnosis not present

## 2023-10-21 ENCOUNTER — Other Ambulatory Visit: Payer: Self-pay | Admitting: Internal Medicine

## 2023-10-21 DIAGNOSIS — F419 Anxiety disorder, unspecified: Secondary | ICD-10-CM | POA: Diagnosis not present

## 2023-10-21 DIAGNOSIS — F4310 Post-traumatic stress disorder, unspecified: Secondary | ICD-10-CM | POA: Diagnosis not present

## 2023-10-21 DIAGNOSIS — F32A Depression, unspecified: Secondary | ICD-10-CM | POA: Diagnosis not present

## 2023-10-21 MED ORDER — TIZANIDINE HCL 2 MG PO TABS
2.0000 mg | ORAL_TABLET | Freq: Two times a day (BID) | ORAL | 1 refills | Status: DC | PRN
Start: 2023-10-21 — End: 2023-11-26

## 2023-10-22 ENCOUNTER — Ambulatory Visit: Attending: Internal Medicine | Admitting: Physical Therapy

## 2023-10-22 ENCOUNTER — Encounter: Payer: Self-pay | Admitting: Physical Therapy

## 2023-10-22 DIAGNOSIS — M6281 Muscle weakness (generalized): Secondary | ICD-10-CM | POA: Diagnosis not present

## 2023-10-22 DIAGNOSIS — M797 Fibromyalgia: Secondary | ICD-10-CM

## 2023-10-22 DIAGNOSIS — R2681 Unsteadiness on feet: Secondary | ICD-10-CM

## 2023-10-22 NOTE — Therapy (Signed)
 OUTPATIENT PHYSICAL THERAPY LOWER EXTREMITY TREATMENT    Patient Name: Laurie Casey MRN: [id number] DOB:[birth year], 38 y.o., female Today's Date: 10/22/2023  END OF SESSION:  PT End of Session - 10/22/23 0810     Visit Number 2    Number of Visits 13    Date for PT Re-Evaluation 11/23/23    Authorization Type [employer]    Authorization Time Period 10/12/23 to 11/23/23    PT Start Time 0810   pt arrived late   PT Stop Time 0842    PT Time Calculation (min) 32 min    Activity Tolerance Patient tolerated treatment well    Behavior During Therapy [facility] for tasks assessed/performed              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   Pre-eclampsia    PTSD (post-traumatic stress disorder) 06/30/2022   Past Surgical History:  Procedure Laterality Date   CESAREAN SECTION  2013, 2015, 2019   x2   CYSTOSCOPY  2008 or 2009   CYSTOSCOPY W/ URETERAL STENT PLACEMENT Right 11/29/2020   Procedure: CYSTOSCOPY WITH RETROGRADE PYELOGRAM/URETERAL STENT PLACEMENT;  Surgeon: Marcine Matar, MD;  Location: [facility] OR;  Service: Urology;  Laterality: Right;   CYSTOSCOPY/URETEROSCOPY/HOLMIUM LASER/STENT PLACEMENT Right 12/13/2020   Procedure: CYSTOSCOPY RIGHT URETEROSCOPY/HOLMIUM LASER/STENT EXTRACTION AND RIGHT JJ STENT PLACEMENT, RIGHT RETROGRADE URETEROSCOPY;  Surgeon: Marcine Matar, MD;  Location: [facility];  Service: Urology;  Laterality: Right;   URETER SURGERY     x2   WISDOM TOOTH EXTRACTION     Patient Active Problem List   Diagnosis Date Noted   Placenta accreta 12/03/2022   History of cesarean section 10/01/2022   Essential hypertension 08/11/2022   Obesity (BMI 30.0-34.9) 08/11/2022    High risk multigravida in third trimester 09/10/2017   Hx of preeclampsia, prior pregnancy, currently pregnant 03/16/2014    PCP: Marcine Matar, MD  REFERRING PROVIDER: Marcine Matar, MD  REFERRING DIAG:  Diagnosis  M79.7 (ICD-10-CM) - Fibromyalgia    THERAPY DIAG:  Fibromyalgia  Muscle weakness (generalized)  Unsteadiness on feet  Rationale for Evaluation and Treatment: Rehabilitation  ONSET DATE: chronic   SUBJECTIVE:   SUBJECTIVE STATEMENT:    Things are feeling OK in general, have been working on my core a lot but haven't been able to get in the water at the gym. Have been really busy with my 62 month old and busy in my greenhouse, haven't been just sitting. Have been doing my acupuncture. Still healing from that bad job, partner has been working more from home and that has taken some stress off too bc he can help more.     EVAL: I've been in constant flare up since February 7th due to some trauma at work. I can't feel my knees or hips and they hurt very badly. I get a lot of different symptoms, tingling, blurred vision, dizziness. I do take Xanax as needed and that does calm some things down. My hands will just stop working (2-3 times a day), usually happens when they're supposed to be working. Quit my job March 18th, things have gotten better. I'm a mom of four so always have  some sort of stress. Had a large flare up before with my ex-husband, at the time I had my partner handle all interactions with him and it went away on its own with time. I do see a formal psychiatrist and counselor. Tend to tolerate the TM better than elliptical, did try the gym to work it out but still had a lot of pain for 3-4 days after the TM too. I have tried water exercise before and it worked well but gym membership will run out April 28th.   PERTINENT HISTORY: See above  PAIN:  Are you having pain? Yes: NPRS scale: 2/10 Pain location: general pain, shin splints are a little worse  than the 2/10 Pain description: weakness/dull ache plus shin splints  Aggravating factors: stress, exercise on TM and elliptical  Relieving factors: TENS (has home unit), heat   PRECAUTIONS: None  RED FLAGS: None   WEIGHT BEARING RESTRICTIONS: No  FALLS:  Has patient fallen in last 6 months? No  LIVING ENVIRONMENT: Lives with: lives with their partner, lives with their son, and lives with their daughter Lives in: House/apartment Stairs:  3 STE home  Has following equipment at home: None  OCCUPATION: unemployed   PLOF: Independent, Independent with basic ADLs, Independent with gait, and Independent with transfers  PATIENT GOALS: be more stable   NEXT MD VISIT:no appt scheduled   OBJECTIVE:  Note: Objective measures were completed at Evaluation unless otherwise noted.    PATIENT SURVEYS:  ABC scale 41.9%  COGNITION: Overall cognitive status: Within functional limits for tasks assessed       POSTURE: rounded shoulders, forward head, increased thoracic kyphosis, and flexed trunk      LOWER EXTREMITY MMT:  MMT Right eval Left eval  Hip flexion 3 3  Hip extension    Hip abduction 4 3  Hip adduction    Hip internal rotation    Hip external rotation    Knee flexion 4 4  Knee extension 4 4  Ankle dorsiflexion 4 cogwheeling  4 cogwheeling   Ankle plantarflexion    Ankle inversion    Ankle eversion     (Blank rows = not tested)    FUNCTIONAL TESTS:  5 times sit to stand: 21.86 seconds intermittent use of UEs  Dynamic Gait Index: 14    10/12/23 0001  Standardized Balance Assessment  Standardized Balance Assessment Dynamic Gait Index  Dynamic Gait Index  Level Surface 2  Change in Gait Speed 2  Gait with Horizontal Head Turns 2  Gait with Vertical Head Turns 2  Gait and Pivot Turn 1  Step Over Obstacle 2  Step Around Obstacles 2  Steps 1  Total Score 14       GAIT: Distance walked: in clinic distances  Assistive device utilized:  None Level of assistance: Complete Independence Comments: very stiff, antalgic, mildly unsteady  TREATMENT DATE:    10/22/23  Nustep L5x6 minutes all four extremities for w/u and tissue perfusion  Tandem stance 2x30 seconds B in corner solid surface  Bridges + ABD into red TB x10  Sidelying clams red TB x10 B Scap retractions 10x3 seconds Thoracic extensions with hands behind neck/gentle pec stretch x10      10/12/23  Eval, POC, gait training with SPC, education on PT role in managing symptoms especially water PT in context of fibro flare and especially given past success with water exercise before     PATIENT EDUCATION:  Education details: as above  Person educated: Patient Education method: Explanation and Verbal cues Education comprehension: verbalized understanding, returned demonstration, and needs further education  HOME EXERCISE PROGRAM:  Access Code: [email or web address] URL: [email or web address] Date: 10/22/2023 Prepared by: Nedra Hai  Exercises - Supine Bridge with Resistance Band  - 1 x daily - 4-5 x weekly - 1 sets - 10 reps - 1 seconds  hold - Clamshell with Resistance  - 1 x daily - 4-5 x weekly - 1 sets - 10 reps - 1 second  hold - Tandem Stance in Corner  - 1 x daily - 7 x weekly - 1 sets - 4 reps - 30 seconds  hold - Seated Scapular Retraction  - 1 x daily - 7 x weekly - 1 sets - 10 reps - 3 seconds  hold - Seated Thoracic Extension with Hands Behind Neck  - 1 x daily - 7 x weekly - 1 sets - 10 reps - 1 second  hold     ASSESSMENT:  CLINICAL IMPRESSION:   Pt arrives today doing better, stress seems a little better and overall pain is improved today. Unfortunately her insurance was not cooperative with allowing for water PT which really would have been in her best interest given widespread pain from fibromyalgia  however we adjusted our plan moving forward and will continue to treat her on land. Developed initial HEP today, encouraged exercise in the water while her gym membership is still active as well. Will continue to challenge her as able and tolerated.      EVAL: Patient is a 38 y.o. F who was seen today for physical therapy evaluation and treatment for  Diagnosis  M79.7 (ICD-10-CM) - Fibromyalgia  . Of note a lot of her symptoms do seem to be highly related to stress and anxiety, she is getting skilled care in this realm. She has had a similar flare up in the past with a highly traumatic event. Objective measures as above. I think in terms of skilled PT services, water therapy would definitely be in her best interest as pain flared very quickly even with basic measures taken on land today. She is agreeable to water PT, will work to arrange this for her.   OBJECTIVE IMPAIRMENTS: Abnormal gait, decreased activity tolerance, decreased balance, decreased coordination, decreased knowledge of use of DME, decreased mobility, difficulty walking, decreased strength, and pain.   ACTIVITY LIMITATIONS: standing, squatting, stairs, transfers, locomotion level, and caring for others  PARTICIPATION LIMITATIONS: driving, shopping, community activity, occupation, and yard work  PERSONAL FACTORS: Age, Behavior pattern, Education, Fitness, Past/current experiences, Sex, Social background, and Time since onset of injury/illness/exacerbation are also affecting patient's functional outcome.   REHAB POTENTIAL: Fair chronic fibro, highly irritable pain levels   CLINICAL DECISION MAKING: Evolving/moderate complexity  EVALUATION COMPLEXITY: Moderate   GOALS: Goals reviewed with patient? No  SHORT TERM GOALS: Target date: 11/02/2023   Will be  compliant with appropriate progressive HEP  Baseline: Goal status: INITIAL  2.  Will be able to ambulate at least 1034ft in community with LRAD, no unsteadiness and rest  breaks PRN with normalized gait pattern  Baseline:  Goal status: INITIAL      LONG TERM GOALS: Target date: 11/23/2023    MMT to have improved by one grade all weak groups  Baseline:  Goal status: INITIAL  2.  Will be able to ambulate unlimited distances in community, no device, rest breaks PRN, with normalized gait pattern  Baseline:  Goal status: INITIAL  3.  Will complete 5xSTS in 15 seconds or less no UEs to show improved mobility  Baseline:  Goal status: INITIAL  4.  Will score at least 20/24 on DGI to show improved functional balance  Baseline:  Goal status: INITIAL  5.  ABC score to improve by at least 20% to show improved confidence with mobility  Baseline:  Goal status: INITIAL     PLAN:  PT FREQUENCY: 1-2x/week  PT DURATION: 6 weeks  PLANNED INTERVENTIONS: 97110-Therapeutic exercises, 97530- Therapeutic activity, [id number]- Neuromuscular re-education, 97535- Self Care, [id number]- Manual therapy, and [phone]- Aquatic Therapy  PLAN FOR NEXT SESSION: get her moving as much as possible/as tolerated- strength, balance, activity tolerance   Nedra Hai, PT, DPT 10/22/23 8:43 AM

## 2023-10-27 ENCOUNTER — Encounter: Payer: Self-pay | Admitting: Physical Therapy

## 2023-10-27 ENCOUNTER — Ambulatory Visit: Admitting: Physical Therapy

## 2023-10-27 DIAGNOSIS — M797 Fibromyalgia: Secondary | ICD-10-CM | POA: Diagnosis not present

## 2023-10-27 DIAGNOSIS — R2681 Unsteadiness on feet: Secondary | ICD-10-CM

## 2023-10-27 DIAGNOSIS — M6281 Muscle weakness (generalized): Secondary | ICD-10-CM | POA: Diagnosis not present

## 2023-10-27 NOTE — Therapy (Signed)
 OUTPATIENT PHYSICAL THERAPY LOWER EXTREMITY TREATMENT    Patient Name: Laurie Casey MRN: [id number] DOB:March 23, 1986, 38 y.o., female Today's Date: 10/27/2023  END OF SESSION:  PT End of Session - 10/27/23 0904     Visit Number 3    Number of Visits 13    Date for PT Re-Evaluation 11/23/23    Authorization Type [employer]    Authorization Time Period 10/12/23 to 11/23/23    PT Start Time 0851   pt few minutes late   PT Stop Time 0929    PT Time Calculation (min) 38 min    Activity Tolerance Patient tolerated treatment well    Behavior During Therapy [facility] for tasks assessed/performed               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   Pre-eclampsia    PTSD (post-traumatic stress disorder) 06/30/2022   Past Surgical History:  Procedure Laterality Date   CESAREAN SECTION  2013, 2015, 2019   x2   CYSTOSCOPY  2008 or 2009   CYSTOSCOPY W/ URETERAL STENT PLACEMENT Right 11/29/2020   Procedure: CYSTOSCOPY WITH RETROGRADE PYELOGRAM/URETERAL STENT PLACEMENT;  Surgeon: Trent Frizzle, MD;  Location: [facility];  Service: Urology;  Laterality: Right;   CYSTOSCOPY/URETEROSCOPY/HOLMIUM LASER/STENT PLACEMENT Right 12/13/2020   Procedure: CYSTOSCOPY RIGHT URETEROSCOPY/HOLMIUM LASER/STENT EXTRACTION AND RIGHT JJ STENT PLACEMENT, RIGHT RETROGRADE URETEROSCOPY;  Surgeon: Trent Frizzle, MD;  Location: [facility];  Service: Urology;  Laterality: Right;   URETER SURGERY     x2   WISDOM TOOTH EXTRACTION     Patient Active Problem List   Diagnosis Date Noted   Placenta accreta 12/03/2022   History of cesarean section 10/01/2022   Essential hypertension 08/11/2022   Obesity (BMI 30.0-34.9) 08/11/2022    High risk multigravida in third trimester 09/10/2017   Hx of preeclampsia, prior pregnancy, currently pregnant 03/16/2014    PCP: Lawrance Presume, MD  REFERRING PROVIDER: Lawrance Presume, MD  REFERRING DIAG:  Diagnosis  M79.7 (ICD-10-CM) - Fibromyalgia    THERAPY DIAG:  Muscle weakness (generalized)  Unsteadiness on feet  Fibromyalgia  Rationale for Evaluation and Treatment: Rehabilitation  ONSET DATE: chronic   SUBJECTIVE:   SUBJECTIVE STATEMENT:  The sidelying exercises are killing me, have only done them a couple of times due to pain and soreness after. Having a lot of pain in neck but Tizandine helps, trying to use TENS at home for it. Have not gotten in the pool due to not having energy to go by the time my partner gets home and dealing with the kids.   Things are feeling OK in general, have been working on my core a lot but haven't been able to get in the water at the gym. 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 Have been doing my acupuncture. Still healing from that bad job, partner has been working more from home and that has taken some stress off too bc he can help more.     EVAL: I've been in constant flare up since February 7th due to some trauma at work. I can't feel my knees or  hips and they hurt very badly. I get a lot of different symptoms, tingling, blurred vision, dizziness. I do take Xanax as needed and that does calm some things down. My hands will just stop working (2-3 times a day), usually happens when they're supposed to be working. Quit my job March 18th, things have gotten better. I'm a mom of four so always have some sort of stress. Had a large flare up before with my ex-husband, at the time I had my partner handle all interactions with him and it went away on its own with time. I do see a formal psychiatrist and counselor. Tend to tolerate the TM better than elliptical, did try the gym to  work it out but still had a lot of pain for 3-4 days after the TM too. I have tried water exercise before and it worked well but gym membership will run out April 28th.   PERTINENT HISTORY: See above  PAIN:  Are you having pain? Yes: NPRS scale: 4/10 Pain location: all over  Pain description: "hard to explain", dull ache plus some  Aggravating factors: stress, exercise on TM and elliptical  Relieving factors: TENS (has home unit), heat   PRECAUTIONS: None  RED FLAGS: None   WEIGHT BEARING RESTRICTIONS: No  FALLS:  Has patient fallen in last 6 months? No  LIVING ENVIRONMENT: Lives with: lives with their partner, lives with their son, and lives with their daughter Lives in: House/apartment Stairs:  3 STE home  Has following equipment at home: None  OCCUPATION: unemployed   PLOF: Independent, Independent with basic ADLs, Independent with gait, and Independent with transfers  PATIENT GOALS: be more stable   NEXT MD VISIT:no appt scheduled   OBJECTIVE:  Note: Objective measures were completed at Evaluation unless otherwise noted.    PATIENT SURVEYS:  ABC scale 41.9%  COGNITION: Overall cognitive status: Within functional limits for tasks assessed       POSTURE: rounded shoulders, forward head, increased thoracic kyphosis, and flexed trunk      LOWER EXTREMITY MMT:  MMT Right eval Left eval  Hip flexion 3 3  Hip extension    Hip abduction 4 3  Hip adduction    Hip internal rotation    Hip external rotation    Knee flexion 4 4  Knee extension 4 4  Ankle dorsiflexion 4 cogwheeling  4 cogwheeling   Ankle plantarflexion    Ankle inversion    Ankle eversion     (Blank rows = not tested)    FUNCTIONAL TESTS:  5 times sit to stand: 21.86 seconds intermittent use of UEs  Dynamic Gait Index: 14    10/12/23 0001  Standardized Balance Assessment  Standardized Balance Assessment Dynamic Gait Index  Dynamic Gait Index  Level Surface 2  Change in Gait  Speed 2  Gait with Horizontal Head Turns 2  Gait with Vertical Head Turns 2  Gait and Pivot Turn 1  Step Over Obstacle 2  Step Around Obstacles 2  Steps 1  Total Score 14       GAIT: Distance walked: in clinic distances  Assistive device utilized: None Level of assistance: Complete Independence Comments: very stiff, antalgic, mildly unsteady  TREATMENT DATE:    10/27/23  Nustep L4-5x8 minutes for w/u, tissue perfusion, endurance  Walking with 5# each LE 240ft  SAQs 5# 12x3 second holds Prone HS curls 5# x12 B Prone hip extensions x12 B knee bent 5# LAQs 5# x12 B  Tandem stance solid surface 2x30 seconds B Side steps along blue foam pad x3 laps intermittent UE touch on bars  Side steps on blue foam pad with alternating toe taps to targets x6 laps        10/22/23  Nustep L5x6 minutes all four extremities for w/u and tissue perfusion  Tandem stance 2x30 seconds B in corner solid surface  Bridges + ABD into red TB x10  Sidelying clams red TB x10 B Scap retractions 10x3 seconds Thoracic extensions with hands behind neck/gentle pec stretch x10      10/12/23  Eval, POC, gait training with SPC, education on PT role in managing symptoms especially water PT in context of fibro flare and especially given past success with water exercise before     PATIENT EDUCATION:  Education details: as above  Person educated: Patient Education method: Explanation and Verbal cues Education comprehension: verbalized understanding, returned demonstration, and needs further education  HOME EXERCISE PROGRAM:  Access Code: [email or web address] URL: [email or web address] Date: 10/22/2023 Prepared by: Nedra Hai  Exercises - Supine Bridge with Resistance Band  - 1 x daily - 4-5 x weekly - 1 sets - 10 reps - 1 seconds  hold - Clamshell with  Resistance  - 1 x daily - 4-5 x weekly - 1 sets - 10 reps - 1 second  hold - Tandem Stance in Corner  - 1 x daily - 7 x weekly - 1 sets - 4 reps - 30 seconds  hold - Seated Scapular Retraction  - 1 x daily - 7 x weekly - 1 sets - 10 reps - 3 seconds  hold - Seated Thoracic Extension with Hands Behind Neck  - 1 x daily - 7 x weekly - 1 sets - 10 reps - 1 second  hold     ASSESSMENT:  CLINICAL IMPRESSION:   Pt arrives today doing OK, had some increased hip pain with clams from HEP- provided yellow TB to use instead of red to see if this helps sx. Many of her reported pains today seem related to ongoing fibro flare, we did what we could today. She has not gone to the pool despite education that this would be really beneficial in context of fibromyalgia. Will continue efforts, she will see Dr. Shearon Stalls in a few weeks and she is hopeful that this will be helpful for her fibro.      EVAL: Patient is a 38 y.o. F who was seen today for physical therapy evaluation and treatment for  Diagnosis  M79.7 (ICD-10-CM) - Fibromyalgia  . Of note a lot of her symptoms do seem to be highly related to stress and anxiety, she is getting skilled care in this realm. She has had a similar flare up in the past with a highly traumatic event. Objective measures as above. I think in terms of skilled PT services, water therapy would definitely be in her best interest as pain flared very quickly even with basic measures taken on land today. She is agreeable to water PT, will work to arrange this for her.   OBJECTIVE IMPAIRMENTS: Abnormal gait, decreased activity tolerance, decreased balance, decreased coordination, decreased knowledge of use of DME, decreased mobility, difficulty walking, decreased strength, and pain.  ACTIVITY LIMITATIONS: standing, squatting, stairs, transfers, locomotion level, and caring for others  PARTICIPATION LIMITATIONS: driving, shopping, community activity, occupation, and yard  work  PERSONAL FACTORS: Age, Behavior pattern, Education, Fitness, Past/current experiences, Sex, Social background, and Time since onset of injury/illness/exacerbation are also affecting patient's functional outcome.   REHAB POTENTIAL: Fair chronic fibro, highly irritable pain levels   CLINICAL DECISION MAKING: Evolving/moderate complexity  EVALUATION COMPLEXITY: Moderate   GOALS: Goals reviewed with patient? No  SHORT TERM GOALS: Target date: 11/02/2023   Will be compliant with appropriate progressive HEP  Baseline: Goal status: INITIAL  2.  Will be able to ambulate at least 1019ft in community with LRAD, no unsteadiness and rest breaks PRN with normalized gait pattern  Baseline:  Goal status: INITIAL      LONG TERM GOALS: Target date: 11/23/2023    MMT to have improved by one grade all weak groups  Baseline:  Goal status: INITIAL  2.  Will be able to ambulate unlimited distances in community, no device, rest breaks PRN, with normalized gait pattern  Baseline:  Goal status: INITIAL  3.  Will complete 5xSTS in 15 seconds or less no UEs to show improved mobility  Baseline:  Goal status: INITIAL  4.  Will score at least 20/24 on DGI to show improved functional balance  Baseline:  Goal status: INITIAL  5.  ABC score to improve by at least 20% to show improved confidence with mobility  Baseline:  Goal status: INITIAL     PLAN:  PT FREQUENCY: 1-2x/week  PT DURATION: 6 weeks  PLANNED INTERVENTIONS: 97110-Therapeutic exercises, 97530- Therapeutic activity, 97112- Neuromuscular re-education, 97535- Self Care, 82956- Manual therapy, and [phone]- Aquatic Therapy  PLAN FOR NEXT SESSION: get her moving as much as possible/as tolerated- strength, balance, activity tolerance, continue to encourage pool work on her own   Terrel Ferries, [place], DPT 10/27/23 9:29 AM

## 2023-10-29 ENCOUNTER — Encounter: Payer: Self-pay | Admitting: Physical Therapy

## 2023-10-29 ENCOUNTER — Ambulatory Visit: Admitting: Physical Therapy

## 2023-10-29 DIAGNOSIS — M797 Fibromyalgia: Secondary | ICD-10-CM | POA: Diagnosis not present

## 2023-10-29 DIAGNOSIS — M6281 Muscle weakness (generalized): Secondary | ICD-10-CM | POA: Diagnosis not present

## 2023-10-29 DIAGNOSIS — R2681 Unsteadiness on feet: Secondary | ICD-10-CM | POA: Diagnosis not present

## 2023-10-29 NOTE — Therapy (Signed)
 OUTPATIENT PHYSICAL THERAPY LOWER EXTREMITY TREATMENT    Patient Name: Laurie Casey MRN: [id number] DOB:Feb 25, 1986, 38 y.o., female Today's Date: 10/29/2023  END OF SESSION:  PT End of Session - 10/29/23 0924     Visit Number 4    Number of Visits 13    Date for PT Re-Evaluation 11/23/23    Authorization Type [employer]    Authorization Time Period 10/12/23 to 11/23/23    PT Start Time 0925    PT Stop Time 1004    PT Time Calculation (min) 39 min    Activity Tolerance Patient tolerated treatment well    Behavior During Therapy [facility] for tasks assessed/performed                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   Pre-eclampsia    PTSD (post-traumatic stress disorder) 06/30/2022   Past Surgical History:  Procedure Laterality Date   CESAREAN SECTION  2013, 2015, 2019   x2   CYSTOSCOPY  2008 or 2009   CYSTOSCOPY W/ URETERAL STENT PLACEMENT Right 11/29/2020   Procedure: CYSTOSCOPY WITH RETROGRADE PYELOGRAM/URETERAL STENT PLACEMENT;  Surgeon: Trent Frizzle, MD;  Location: [facility] OR;  Service: Urology;  Laterality: Right;   CYSTOSCOPY/URETEROSCOPY/HOLMIUM LASER/STENT PLACEMENT Right 12/13/2020   Procedure: CYSTOSCOPY RIGHT URETEROSCOPY/HOLMIUM LASER/STENT EXTRACTION AND RIGHT JJ STENT PLACEMENT, RIGHT RETROGRADE URETEROSCOPY;  Surgeon: Trent Frizzle, MD;  Location: [facility] ORS;  Service: Urology;  Laterality: Right;   URETER SURGERY     x2   WISDOM TOOTH EXTRACTION     Patient Active Problem List   Diagnosis Date Noted   Placenta accreta 12/03/2022   History of cesarean section 10/01/2022   Essential hypertension 08/11/2022   Obesity (BMI 30.0-34.9) 08/11/2022   High risk  multigravida in third trimester 09/10/2017   Hx of preeclampsia, prior pregnancy, currently pregnant 03/16/2014    PCP: Lawrance Presume, MD  REFERRING PROVIDER: Lawrance Presume, MD  REFERRING DIAG:  Diagnosis  M79.7 (ICD-10-CM) - Fibromyalgia    THERAPY DIAG:  Muscle weakness (generalized)  Unsteadiness on feet  Fibromyalgia  Rationale for Evaluation and Treatment: Rehabilitation  ONSET DATE: chronic   SUBJECTIVE:   SUBJECTIVE STATEMENT:   Changing to  yellow  TB helped a lot, pain is down today. Nothing really new, I honestly think that taking Tizanidine every 12 hours is really helping. Hips are just tight.    EVAL: I've been in constant flare up since February 7th due to some trauma at work. I can't feel my knees or hips and they hurt very badly. I get a lot of different symptoms, tingling, blurred vision, dizziness. I do take Xanax as needed and that does calm some things down. My hands will just stop working (2-3 times a day), usually happens when they're supposed to be working. Quit my job March 18th, things have gotten better. I'm a mom of four so always have some sort of stress. Had a large flare up before with my ex-husband, at the time I had my partner handle all interactions with him and it went away on its own with time. I do see a formal psychiatrist and counselor. Tend to tolerate the TM  better than elliptical, did try the gym to work it out but still had a lot of pain for 3-4 days after the TM too. I have tried water exercise before and it worked well but gym membership will run out April 28th.   PERTINENT HISTORY: See above  PAIN:  Are you having pain? Yes: NPRS scale: 1-2/10 Pain location: all over  Pain description: "hard to explain", dull ache plus some  Aggravating factors: stress, exercise on TM and elliptical  Relieving factors: TENS (has home unit), heat, Tizanidine    PRECAUTIONS: None  RED FLAGS: None   WEIGHT BEARING RESTRICTIONS:  No  FALLS:  Has patient fallen in last 6 months? No  LIVING ENVIRONMENT: Lives with: lives with their partner, lives with their son, and lives with their daughter Lives in: House/apartment Stairs:  3 STE home  Has following equipment at home: None  OCCUPATION: unemployed   PLOF: Independent, Independent with basic ADLs, Independent with gait, and Independent with transfers  PATIENT GOALS: be more stable   NEXT MD VISIT:no appt scheduled   OBJECTIVE:  Note: Objective measures were completed at Evaluation unless otherwise noted.    PATIENT SURVEYS:  ABC scale 41.9%  COGNITION: Overall cognitive status: Within functional limits for tasks assessed       POSTURE: rounded shoulders, forward head, increased thoracic kyphosis, and flexed trunk      LOWER EXTREMITY MMT:  MMT Right eval Left eval  Hip flexion 3 3  Hip extension    Hip abduction 4 3  Hip adduction    Hip internal rotation    Hip external rotation    Knee flexion 4 4  Knee extension 4 4  Ankle dorsiflexion 4 cogwheeling  4 cogwheeling   Ankle plantarflexion    Ankle inversion    Ankle eversion     (Blank rows = not tested)    FUNCTIONAL TESTS:  5 times sit to stand: 21.86 seconds intermittent use of UEs  Dynamic Gait Index: 14    10/12/23 0001  Standardized Balance Assessment  Standardized Balance Assessment Dynamic Gait Index  Dynamic Gait Index  Level Surface 2  Change in Gait Speed 2  Gait with Horizontal Head Turns 2  Gait with Vertical Head Turns 2  Gait and Pivot Turn 1  Step Over Obstacle 2  Step Around Obstacles 2  Steps 1  Total Score 14       GAIT: Distance walked: in clinic distances  Assistive device utilized: None Level of assistance: Complete Independence Comments: very stiff, antalgic, mildly unsteady                                                                                                                                 TREATMENT DATE:     10/29/23  Nustep L4 x8 minute BLEs only for w/u and LE mm strength/endurance STS with red TB above knees x10 Standing hip ABD red TB above knees  x10 [employer] carry 12# 351ft (2 rounds) Forward lunges onto BOSU x10 B   Tandem walks forward and backwards in // bars solid surface x4 laps  Side steps along blue foam pad x2 laps, then added stepping over targets while side stepping on foam x3 rounds  Wide tandem with one foot on soft surface of BOSU and other on blue foam pad 3x30 seconds B             10/27/23  Nustep L4-5x8 minutes for w/u, tissue perfusion, endurance  Walking with 5# each LE 262ft  SAQs 5# 12x3 second holds Prone HS curls 5# x12 B Prone hip extensions x12 B knee bent 5# LAQs 5# x12 B  Tandem stance solid surface 2x30 seconds B Side steps along blue foam pad x3 laps intermittent UE touch on bars  Side steps on blue foam pad with alternating toe taps to targets x6 laps        PATIENT EDUCATION:  Education details: as above  Person educated: Patient Education method: Explanation and Verbal cues Education comprehension: verbalized understanding, returned demonstration, and needs further education  HOME EXERCISE PROGRAM:  Access Code: [email or web address] URL: [email or web address] Date: 10/29/2023 Prepared by: Terrel Ferries  Exercises - Supine Bridge with Resistance Band  - 1 x daily - 4-5 x weekly - 1 sets - 10 reps - 1 seconds  hold - Clamshell with Resistance  - 1 x daily - 4-5 x weekly - 1 sets - 10 reps - 1 second  hold - Tandem Stance in Corner  - 1 x daily - 7 x weekly - 1 sets - 4 reps - 30 seconds  hold - Seated Scapular Retraction  - 1 x daily - 7 x weekly - 1 sets - 10 reps - 3 seconds  hold - Seated Thoracic Extension with Hands Behind Neck  - 1 x daily - 7 x weekly - 1 sets - 10 reps - 1 second  hold - Sit to Stand with Resistance Around Legs  - 1 x daily - 7 x weekly - 1 sets - 10 reps - Standing Hip Abduction with  Resistance at Thighs  - 1 x daily - 7 x weekly - 1 sets - 10 reps - Tandem Walking with Counter Support  - 1 x daily - 7 x weekly - 1 sets - 5 reps - Backward Tandem Walking with Counter Support  - 1 x daily - 7 x weekly - 1 sets - 5 reps     ASSESSMENT:  CLINICAL IMPRESSION:   Pt arrives today feeling better, pain was lower today so we progressed all interventions as able and tolerated this morning. Sounds like some med adjustments have been helping. Updated HEP and modified POC today to account for only having 4 more visits approved until the end of May. Really encouraged increasing activity level at home on top of HEP as well.      EVAL: Patient is a 38 y.o. F who was seen today for physical therapy evaluation and treatment for  Diagnosis  M79.7 (ICD-10-CM) - Fibromyalgia  . Of note a lot of her symptoms do seem to be highly related to stress and anxiety, she is getting skilled care in this realm. She has had a similar flare up in the past with a highly traumatic event. Objective measures as above. I think in terms of skilled PT services, water therapy would definitely be in her best interest as pain flared very quickly even with basic measures taken  on land today. She is agreeable to water PT, will work to arrange this for her.   OBJECTIVE IMPAIRMENTS: Abnormal gait, decreased activity tolerance, decreased balance, decreased coordination, decreased knowledge of use of DME, decreased mobility, difficulty walking, decreased strength, and pain.   ACTIVITY LIMITATIONS: standing, squatting, stairs, transfers, locomotion level, and caring for others  PARTICIPATION LIMITATIONS: driving, shopping, community activity, occupation, and yard work  PERSONAL FACTORS: Age, Behavior pattern, Education, Fitness, Past/current experiences, Sex, Social background, and Time since onset of injury/illness/exacerbation are also affecting patient's functional outcome.   REHAB POTENTIAL: Fair chronic fibro,  highly irritable pain levels   CLINICAL DECISION MAKING: Evolving/moderate complexity  EVALUATION COMPLEXITY: Moderate   GOALS: Goals reviewed with patient? No  SHORT TERM GOALS: Target date: 11/02/2023   Will be compliant with appropriate progressive HEP  Baseline: Goal status: MET 10/29/23  2.  Will be able to ambulate at least 1050ft in community with LRAD, no unsteadiness and rest breaks PRN with normalized gait pattern  Baseline:  Goal status: ONGOING 10/29/23 gait pattern and steadiness have improved, she has not tried longer distance walking on her own yet       LONG TERM GOALS: Target date: 11/23/2023    MMT to have improved by one grade all weak groups  Baseline:  Goal status: INITIAL  2.  Will be able to ambulate unlimited distances in community, no device, rest breaks PRN, with normalized gait pattern  Baseline:  Goal status: INITIAL  3.  Will complete 5xSTS in 15 seconds or less no UEs to show improved mobility  Baseline:  Goal status: INITIAL  4.  Will score at least 20/24 on DGI to show improved functional balance  Baseline:  Goal status: INITIAL  5.  ABC score to improve by at least 20% to show improved confidence with mobility  Baseline:  Goal status: INITIAL     PLAN:  PT FREQUENCY: 1-2x/week  PT DURATION: 6 weeks  PLANNED INTERVENTIONS: 97110-Therapeutic exercises, 97530- Therapeutic activity, 97112- Neuromuscular re-education, 97535- Self Care, 16109- Manual therapy, and [phone]- Aquatic Therapy  PLAN FOR NEXT SESSION: get her moving as much as possible/as tolerated- strength, balance, activity tolerance, continue to encourage pool work on her own. Has just a couple more visits before 5/29, try to stretch them out and keep updating HEP/encouraging independent exercise   Terrel Ferries, PT, DPT 10/29/23 10:04 AM

## 2023-11-02 ENCOUNTER — Ambulatory Visit (HOSPITAL_BASED_OUTPATIENT_CLINIC_OR_DEPARTMENT_OTHER): Admitting: Physical Therapy

## 2023-11-04 ENCOUNTER — Ambulatory Visit: Admitting: Physical Therapy

## 2023-11-04 ENCOUNTER — Ambulatory Visit (HOSPITAL_BASED_OUTPATIENT_CLINIC_OR_DEPARTMENT_OTHER): Admitting: Physical Therapy

## 2023-11-04 DIAGNOSIS — F419 Anxiety disorder, unspecified: Secondary | ICD-10-CM | POA: Diagnosis not present

## 2023-11-04 DIAGNOSIS — F4310 Post-traumatic stress disorder, unspecified: Secondary | ICD-10-CM | POA: Diagnosis not present

## 2023-11-04 DIAGNOSIS — F32A Depression, unspecified: Secondary | ICD-10-CM | POA: Diagnosis not present

## 2023-11-05 NOTE — Therapy (Signed)
 OUTPATIENT PHYSICAL THERAPY LOWER EXTREMITY TREATMENT    Patient Name: MAAHI LANNAN MRN: [id number] DOB:January 28, 1986, 38 y.o., female Today's Date: 11/06/2023  END OF SESSION:  PT End of Session - 11/06/23 1017     Visit Number 5    Number of Visits 13    Date for PT Re-Evaluation 11/23/23    Authorization Type [employer]    Authorization Time Period 10/12/23 to 11/23/23    PT Start Time 1016    PT Stop Time 1100    PT Time Calculation (min) 44 min    Activity Tolerance Patient tolerated treatment well    Behavior During Therapy [facility] for tasks assessed/performed                 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   Pre-eclampsia    PTSD (post-traumatic stress disorder) 06/30/2022   Past Surgical History:  Procedure Laterality Date   CESAREAN SECTION  2013, 2015, 2019   x2   CYSTOSCOPY  2008 or 2009   CYSTOSCOPY W/ URETERAL STENT PLACEMENT Right 11/29/2020   Procedure: CYSTOSCOPY WITH RETROGRADE PYELOGRAM/URETERAL STENT PLACEMENT;  Surgeon: Trent Frizzle, MD;  Location: [facility] OR;  Service: Urology;  Laterality: Right;   CYSTOSCOPY/URETEROSCOPY/HOLMIUM LASER/STENT PLACEMENT Right 12/13/2020   Procedure: CYSTOSCOPY RIGHT URETEROSCOPY/HOLMIUM LASER/STENT EXTRACTION AND RIGHT JJ STENT PLACEMENT, RIGHT RETROGRADE URETEROSCOPY;  Surgeon: Trent Frizzle, MD;  Location: [facility];  Service: Urology;  Laterality: Right;   URETER SURGERY     x2   WISDOM TOOTH EXTRACTION     Patient Active Problem List   Diagnosis Date Noted   Placenta accreta 12/03/2022   History of cesarean section 10/01/2022   Essential hypertension 08/11/2022   Obesity (BMI 30.0-34.9) 08/11/2022   High risk  multigravida in third trimester 09/10/2017   Hx of preeclampsia, prior pregnancy, currently pregnant 03/16/2014    PCP: Lawrance Presume, MD  REFERRING PROVIDER: Lawrance Presume, MD  REFERRING DIAG:  Diagnosis  M79.7 (ICD-10-CM) - Fibromyalgia    THERAPY DIAG:  Muscle weakness (generalized)  Unsteadiness on feet  Fibromyalgia  Rationale for Evaluation and Treatment: Rehabilitation  ONSET DATE: chronic   SUBJECTIVE:   SUBJECTIVE STATEMENT:  The past 2 day I have had the worst flare up of my life. Woke up this morning feeling a little better. Pain levels 6/10, but my average is about a 4/10.    EVAL: I've been in constant flare up since February 7th due to some trauma at work. I can't feel my knees or hips and they hurt very badly. I get a lot of different symptoms, tingling, blurred vision, dizziness. I do take Xanax  as needed and that does calm some things down. My hands will just stop working (2-3 times a day), usually happens when they're supposed to be working. Quit my job March 18th, things have gotten better. I'm a mom of four so always have some sort of stress. Had a large flare up before with my ex-husband, at the time I had my partner handle all interactions with him and it went away on its own with time. I do see a formal psychiatrist and counselor. Tend to tolerate the TM  better than elliptical, did try the gym to work it out but still had a lot of pain for 3-4 days after the TM too. I have tried water  exercise before and it worked well but gym membership will run out April 28th.   PERTINENT HISTORY: See above  PAIN:  Are you having pain? Yes: NPRS scale: 4/10 Pain location: all over  Pain description: "hard to explain", dull ache plus some  Aggravating factors: stress, exercise on TM and elliptical  Relieving factors: TENS (has home unit), heat, Tizanidine     PRECAUTIONS: None  RED FLAGS: None   WEIGHT BEARING RESTRICTIONS: No  FALLS:  Has patient  fallen in last 6 months? No  LIVING ENVIRONMENT: Lives with: lives with their partner, lives with their son, and lives with their daughter Lives in: House/apartment Stairs:  3 STE home  Has following equipment at home: None  OCCUPATION: unemployed   PLOF: Independent, Independent with basic ADLs, Independent with gait, and Independent with transfers  PATIENT GOALS: be more stable   NEXT MD VISIT:no appt scheduled   OBJECTIVE:  Note: Objective measures were completed at Evaluation unless otherwise noted.    PATIENT SURVEYS:  ABC scale 41.9%  COGNITION: Overall cognitive status: Within functional limits for tasks assessed       POSTURE: rounded shoulders, forward head, increased thoracic kyphosis, and flexed trunk      LOWER EXTREMITY MMT:  MMT Right eval Left eval  Hip flexion 3 3  Hip extension    Hip abduction 4 3  Hip adduction    Hip internal rotation    Hip external rotation    Knee flexion 4 4  Knee extension 4 4  Ankle dorsiflexion 4 cogwheeling  4 cogwheeling   Ankle plantarflexion    Ankle inversion    Ankle eversion     (Blank rows = not tested)    FUNCTIONAL TESTS:  5 times sit to stand: 21.86 seconds intermittent use of UEs  Dynamic Gait Index: 14    10/12/23 0001  Standardized Balance Assessment  Standardized Balance Assessment Dynamic Gait Index  Dynamic Gait Index  Level Surface 2  Change in Gait Speed 2  Gait with Horizontal Head Turns 2  Gait with Vertical Head Turns 2  Gait and Pivot Turn 1  Step Over Obstacle 2  Step Around Obstacles 2  Steps 1  Total Score 14       GAIT: Distance walked: in clinic distances  Assistive device utilized: None Level of assistance: Complete Independence Comments: very stiff, antalgic, mildly unsteady                                                                                                                                 TREATMENT DATE:  11/06/23 NuStep L5x67mins  STS 2x8 Hip  abd with green band 2x10 Seated marching with green band 2x10 Ball squeezes 2x10 Supine stretches LTR, HS, SKTC  Feet on pball  rotations, knees to chest Bridges 2x10    10/29/23  Nustep L4 x8 minute BLEs only for w/u and LE mm strength/endurance STS with red TB above knees x10 Standing hip ABD red TB above knees x10 [employer] carry 12# 340ft (2 rounds) Forward lunges onto BOSU x10 B  Tandem walks forward and backwards in // bars solid surface x4 laps  Side steps along blue foam pad x2 laps, then added stepping over targets while side stepping on foam x3 rounds  Wide tandem with one foot on soft surface of BOSU and other on blue foam pad 3x30 seconds B     10/27/23  Nustep L4-5x8 minutes for w/u, tissue perfusion, endurance  Walking with 5# each LE 230ft  SAQs 5# 12x3 second holds Prone HS curls 5# x12 B Prone hip extensions x12 B knee bent 5# LAQs 5# x12 B  Tandem stance solid surface 2x30 seconds B Side steps along blue foam pad x3 laps intermittent UE touch on bars  Side steps on blue foam pad with alternating toe taps to targets x6 laps        PATIENT EDUCATION:  Education details: as above  Person educated: Patient Education method: Explanation and Verbal cues Education comprehension: verbalized understanding, returned demonstration, and needs further education  HOME EXERCISE PROGRAM:  Access Code: [email or web address] URL: [email or web address] Date: 10/29/2023 Prepared by: Terrel Ferries  Exercises - Supine Bridge with Resistance Band  - 1 x daily - 4-5 x weekly - 1 sets - 10 reps - 1 seconds  hold - Clamshell with Resistance  - 1 x daily - 4-5 x weekly - 1 sets - 10 reps - 1 second  hold - Tandem Stance in Corner  - 1 x daily - 7 x weekly - 1 sets - 4 reps - 30 seconds  hold - Seated Scapular Retraction  - 1 x daily - 7 x weekly - 1 sets - 10 reps - 3 seconds  hold - Seated Thoracic Extension with Hands Behind Neck  - 1 x daily - 7 x weekly - 1 sets -  10 reps - 1 second  hold - Sit to Stand with Resistance Around Legs  - 1 x daily - 7 x weekly - 1 sets - 10 reps - Standing Hip Abduction with Resistance at Thighs  - 1 x daily - 7 x weekly - 1 sets - 10 reps - Tandem Walking with Counter Support  - 1 x daily - 7 x weekly - 1 sets - 5 reps - Backward Tandem Walking with Counter Support  - 1 x daily - 7 x weekly - 1 sets - 5 reps     ASSESSMENT:  CLINICAL IMPRESSION: Pt arrives with higher pain levels, she had a really bad flare up the late 2 days. She has some pain and weakness present in her low back and hips. We focused on some light strengthening as much as she could tolerate. Pt reports core weakness after c-section 11 months ago. Will benefit from ongoing stretching, strengthening, balance training and pain management.     EVAL: Patient is a 38 y.o. F who was seen today for physical therapy evaluation and treatment for  Diagnosis  M79.7 (ICD-10-CM) - Fibromyalgia  . Of note a lot of her symptoms do seem to be highly related to stress and anxiety, she is getting skilled care in this realm. She has had a similar flare up in the past with a highly traumatic event. Objective measures as above. I  think in terms of skilled PT services, water  therapy would definitely be in her best interest as pain flared very quickly even with basic measures taken on land today. She is agreeable to water  PT, will work to arrange this for her.   OBJECTIVE IMPAIRMENTS: Abnormal gait, decreased activity tolerance, decreased balance, decreased coordination, decreased knowledge of use of DME, decreased mobility, difficulty walking, decreased strength, and pain.   ACTIVITY LIMITATIONS: standing, squatting, stairs, transfers, locomotion level, and caring for others  PARTICIPATION LIMITATIONS: driving, shopping, community activity, occupation, and yard work  PERSONAL FACTORS: Age, Behavior pattern, Education, Fitness, Past/current experiences, Sex, Social  background, and Time since onset of injury/illness/exacerbation are also affecting patient's functional outcome.   REHAB POTENTIAL: Fair chronic fibro, highly irritable pain levels   CLINICAL DECISION MAKING: Evolving/moderate complexity  EVALUATION COMPLEXITY: Moderate   GOALS: Goals reviewed with patient? No  SHORT TERM GOALS: Target date: 11/02/2023   Will be compliant with appropriate progressive HEP  Baseline: Goal status: MET 10/29/23  2.  Will be able to ambulate at least 1076ft in community with LRAD, no unsteadiness and rest breaks PRN with normalized gait pattern  Baseline:  Goal status: ONGOING 10/29/23 gait pattern and steadiness have improved, she has not tried longer distance walking on her own yet       LONG TERM GOALS: Target date: 11/23/2023    MMT to have improved by one grade all weak groups  Baseline:  Goal status: INITIAL  2.  Will be able to ambulate unlimited distances in community, no device, rest breaks PRN, with normalized gait pattern  Baseline:  Goal status: INITIAL  3.  Will complete 5xSTS in 15 seconds or less no UEs to show improved mobility  Baseline:  Goal status: INITIAL  4.  Will score at least 20/24 on DGI to show improved functional balance  Baseline:  Goal status: INITIAL  5.  ABC score to improve by at least 20% to show improved confidence with mobility  Baseline:  Goal status: INITIAL     PLAN:  PT FREQUENCY: 1-2x/week  PT DURATION: 6 weeks  PLANNED INTERVENTIONS: 97110-Therapeutic exercises, 97530- Therapeutic activity, 97112- Neuromuscular re-education, 97535- Self Care, 91478- Manual therapy, and [phone]- Aquatic Therapy  PLAN FOR NEXT SESSION: get her moving as much as possible/as tolerated- strength, balance, activity tolerance, core strengthening  Step ups  Side steps on airex Step up on airex   Donavon Fudge, PT, DPT 11/06/23 10:59 AM

## 2023-11-06 ENCOUNTER — Ambulatory Visit

## 2023-11-06 DIAGNOSIS — R2681 Unsteadiness on feet: Secondary | ICD-10-CM

## 2023-11-06 DIAGNOSIS — M797 Fibromyalgia: Secondary | ICD-10-CM | POA: Diagnosis not present

## 2023-11-06 DIAGNOSIS — M6281 Muscle weakness (generalized): Secondary | ICD-10-CM | POA: Diagnosis not present

## 2023-11-09 ENCOUNTER — Ambulatory Visit: Admitting: Physical Therapy

## 2023-11-09 ENCOUNTER — Ambulatory Visit (HOSPITAL_BASED_OUTPATIENT_CLINIC_OR_DEPARTMENT_OTHER): Admitting: Physical Therapy

## 2023-11-10 ENCOUNTER — Encounter: Payer: Self-pay | Admitting: Diagnostic Neuroimaging

## 2023-11-10 ENCOUNTER — Ambulatory Visit: Payer: Self-pay | Admitting: Diagnostic Neuroimaging

## 2023-11-10 VITALS — BP 142/84 | HR 92 | Ht 65.0 in | Wt 196.0 lb

## 2023-11-10 DIAGNOSIS — R202 Paresthesia of skin: Secondary | ICD-10-CM | POA: Diagnosis not present

## 2023-11-10 DIAGNOSIS — R531 Weakness: Secondary | ICD-10-CM

## 2023-11-10 DIAGNOSIS — G894 Chronic pain syndrome: Secondary | ICD-10-CM | POA: Diagnosis not present

## 2023-11-10 DIAGNOSIS — R200 Anesthesia of skin: Secondary | ICD-10-CM

## 2023-11-10 NOTE — Patient Instructions (Signed)
  PAIN SYNDROME, WEAKNESS, ABNORMAL SENSATIONS (since ~2021) - check MRI brain (with and without), labs - continue pain mgmt, psychiatry follow up

## 2023-11-10 NOTE — Progress Notes (Signed)
 [facility]  PATIENT: Laurie Casey DOB: 08-05-1985  REFERRING CLINICIAN: Lawrance Presume, MD HISTORY FROM: patient REASON FOR VISIT: new consult   HISTORICAL  CHIEF COMPLAINT:  Chief Complaint  Patient presents with   Tremors    RM 7 alone Pt is well, reports she will have full body tremors and BLE weakness. Her LE feels like bugs are crawling on her. Occasionally will loose function of R arm. Symptoms have been persistent for 3 yrs.     HISTORY OF PRESENT ILLNESS:   38 year old female here for evaluation of pain, weakness, tremors.  History of anxiety, depression, PTSD, adjustment disorder.  Symptom started around 2021.  At that time she was going through some stressful home situation.  Started to have pain in bilateral hips, legs, associated with weakness.  Was having some difficulty with right arm function.  Having sensation of bugs crawling over her.  Having some pain in her upper thoracic and cervical spine region.  Went to a neurologist or neurosurgeon in the past, had MRI of the cervical thoracic spine as well as electrical testing with no specific cause found.  She was recommended to follow-up with psychiatry.  Has been to PCP as well.   REVIEW OF SYSTEMS: Full 14 system review of systems performed and negative with exception of: as per HPI.  ALLERGIES: Allergies  Allergen Reactions   Tape Rash    Adhesive    HOME MEDICATIONS: Outpatient Medications Prior to Visit  Medication Sig Dispense Refill   amLODipine  (NORVASC ) 5 MG tablet Take 5 mg by mouth daily.     buPROPion (WELLBUTRIN XL) 300 MG 24 hr tablet Take 300 mg by mouth every morning.     DULoxetine (CYMBALTA) 30 MG capsule Take 30 mg by mouth 2 (two) times daily.     gabapentin  (NEURONTIN ) 600 MG tablet Take 600 mg by mouth 2 (two) times daily.     Multiple Vitamin (MULTIVITAMIN) tablet Take 1 tablet by mouth daily.     tiZANidine  (ZANAFLEX ) 2 MG tablet Take 1 tablet (2 mg total)  by mouth 2 (two) times daily as needed for muscle spasms. 30 tablet 1   XANAX  0.25 MG tablet Take 0.25 mg by mouth as needed.     aspirin EC 81 MG tablet Take 81 mg by mouth daily. Swallow whole. (Patient not taking: Reported on 07/23/2023)     No facility-administered medications prior to visit.    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   Pre-eclampsia    PTSD (post-traumatic stress disorder) 06/30/2022    PAST SURGICAL HISTORY: Past Surgical History:  Procedure Laterality Date   CESAREAN SECTION  2013, 2015, 2019   x2   CESAREAN SECTION  03/24/2023   with partial historectomy with uterus, cervix and tube removal   CYSTOSCOPY  2008 or 2009   CYSTOSCOPY W/ URETERAL STENT PLACEMENT Right 11/29/2020   Procedure: CYSTOSCOPY WITH RETROGRADE PYELOGRAM/URETERAL STENT PLACEMENT;  Surgeon: Trent Frizzle, MD;  Location: [facility] OR;  Service: Urology;  Laterality: Right;   CYSTOSCOPY/URETEROSCOPY/HOLMIUM LASER/STENT PLACEMENT Right 12/13/2020   Procedure: CYSTOSCOPY RIGHT URETEROSCOPY/HOLMIUM LASER/STENT EXTRACTION AND RIGHT JJ STENT PLACEMENT, RIGHT RETROGRADE  URETEROSCOPY;  Surgeon: Trent Frizzle, MD;  Location: [facility] ORS;  Service: Urology;  Laterality: Right;   URETER SURGERY     x2   WISDOM TOOTH EXTRACTION      FAMILY HISTORY: Family History  Problem Relation Age of Onset   Hypertension Father     SOCIAL HISTORY: Social History   Socioeconomic History   Marital status: Significant Other    Spouse name: Not on file   Number of children: 3   Years of education: Not on file   Highest education level: Not on file  Occupational History   Occupation: Vet tech  Tobacco Use    Smoking status: Former    Current packs/day: 0.00    Types: Cigarettes    Quit date: 10/12/2020    Years since quitting: 3.0   Smokeless tobacco: Never   Tobacco comments:    off and on   Vaping Use   Vaping status: Never Used  Substance and Sexual Activity   Alcohol use: Not Currently   Drug use: Never   Sexual activity: Not on file  Other Topics Concern   Not on file  Social History Narrative   Not on file   Social Drivers of Health   Financial Resource Strain: Low Risk  (07/23/2023)   Overall Financial Resource Strain (CARDIA)    Difficulty of Paying Living Expenses: Not hard at all  Food Insecurity: No Food Insecurity (07/23/2023)   Hunger Vital Sign    Worried About Running Out of Food in the Last Year: Never true    Ran Out of Food in the Last Year: Never true  Transportation Needs: No Transportation Needs (07/23/2023)   PRAPARE - Administrator, Civil Service (Medical): No    Lack of Transportation (Non-Medical): No  Physical Activity: Sufficiently Active (07/23/2023)   Exercise Vital Sign    Days of Exercise per Week: 5 days    Minutes of Exercise per Session: 40 min  Stress: Stress Concern Present (07/23/2023)   [employer] of Occupational Health - Occupational Stress Questionnaire    Feeling of Stress : Very much  Social Connections: Socially Isolated (07/23/2023)   Social Connection and Isolation Panel [NHANES]    Frequency of Communication with Friends and Family: Never    Frequency of Social Gatherings with Friends and Family: Never    Attends Religious Services: Never    Database administrator or Organizations: No    Attends Banker Meetings: Never    Marital Status: Living with partner  Intimate Partner Violence: Not At Risk (07/23/2023)   Humiliation, Afraid, Rape, and Kick questionnaire    Fear of Current or Ex-Partner: No    Emotionally Abused: No    Physically Abused: No    Sexually Abused: No     PHYSICAL EXAM  GENERAL  EXAM/CONSTITUTIONAL: Vitals:  Vitals:   11/10/23 0831 11/10/23 0839  BP: (!) 143/100 (!) 142/84  Pulse: 92 92  Weight: 196 lb (88.9 kg)   Height: 5\' 5"  (1.651 m)    Body mass index is 32.62 kg/m. Wt Readings from Last 3 Encounters:  11/10/23 196 lb (88.9 kg)  07/23/23 200 lb (90.7 kg)  05/22/23 198 lb 9.6 oz (90.1 kg)   Patient is in no distress; well developed, nourished and groomed; neck is supple  CARDIOVASCULAR: Examination of carotid arteries is normal; no carotid bruits Regular rate and rhythm, no murmurs Examination of peripheral vascular system by observation and palpation is normal  EYES: Ophthalmoscopic  exam of optic discs and posterior segments is normal; no papilledema or hemorrhages No results found.  MUSCULOSKELETAL: Gait, strength, tone, movements noted in Neurologic exam below  NEUROLOGIC: MENTAL STATUS:      No data to display         awake, alert, oriented to person, place and time recent and remote memory intact normal attention and concentration language fluent, comprehension intact, naming intact fund of knowledge appropriate  CRANIAL NERVE:  2nd - no papilledema on fundoscopic exam 2nd, 3rd, 4th, 6th - pupils equal and reactive to light, visual fields full to confrontation, extraocular muscles intact, no nystagmus 5th - facial sensation symmetric 7th - facial strength symmetric 8th - hearing intact 9th - palate elevates symmetrically, uvula midline 11th - shoulder shrug symmetric 12th - tongue protrusion midline  MOTOR:  normal bulk and tone, full strength in the BUE, BLE  SENSORY:  normal and symmetric to light touch, temperature, vibration  COORDINATION:  finger-nose-finger, fine finger movements normal  REFLEXES:  deep tendon reflexes 1+ and symmetric  GAIT/STATION:  narrow based gait     DIAGNOSTIC DATA (LABS, IMAGING, TESTING) - I reviewed patient records, labs, notes, testing and imaging myself where  available.  Lab Results  Component Value Date   WBC 14.0 (H) 10/01/2022   HGB 13.4 10/01/2022   HCT 39.4 10/01/2022   MCV 94 10/01/2022   PLT 169 10/01/2022      Component Value Date/Time   NA 137 10/01/2022 1004   K 4.2 10/01/2022 1004   CL 106 10/01/2022 1004   CO2 16 (L) 10/01/2022 1004   GLUCOSE 88 10/01/2022 1004   GLUCOSE 99 08/20/2022 0830   BUN 11 10/01/2022 1004   CREATININE 0.72 10/01/2022 1004   CALCIUM 9.6 10/01/2022 1004   PROT 6.7 10/01/2022 1004   ALBUMIN 4.1 10/01/2022 1004   AST 23 10/01/2022 1004   ALT 16 10/01/2022 1004   ALKPHOS 74 10/01/2022 1004   BILITOT 0.3 10/01/2022 1004   GFRNONAA >60 08/20/2022 0830   No results found for: "CHOL", "HDL", "LDLCALC", "LDLDIRECT", "TRIG", "CHOLHDL" Lab Results  Component Value Date   HGBA1C 5.4 10/01/2022   No results found for: "VITAMINB12" No results found for: "TSH"  04/29/21 MRI cervical / thoracic 1. Very mild degenerative changes of the cervical spine including minimal right-sided neural foraminal narrowing at C4-C5.  2. Small disc bulge at T6-T7 without stenosis.    ASSESSMENT AND PLAN  38 y.o. year old female here with:  Dx:  1. Pain syndrome, chronic   2. Numbness and tingling   3. Weakness     PLAN:  PAIN SYNDROME, WEAKNESS, ABNORMAL SENSATIONS (since ~2021) - check MRI brain (with and without), labs - continue pain mgmt, psychiatry follow up  Orders Placed This Encounter  Procedures   MR BRAIN W WO CONTRAST   ANA,IFA RA Diag Pnl w/rflx Tit/Patn   Vitamin B12   Return for pending test results, pending if symptoms worsen or fail to improve.    Omega Bible, MD 11/10/2023, 9:31 AM Certified in Neurology, Neurophysiology and Neuroimaging  [facility] [address] [place], [place] [address] [phone]

## 2023-11-11 ENCOUNTER — Ambulatory Visit (HOSPITAL_BASED_OUTPATIENT_CLINIC_OR_DEPARTMENT_OTHER): Admitting: Physical Therapy

## 2023-11-11 ENCOUNTER — Telehealth: Payer: Self-pay | Admitting: Diagnostic Neuroimaging

## 2023-11-11 NOTE — Telephone Encounter (Signed)
 Healthy blue Siegfried Dress: [id number] exp. 11/11/23-01/09/24 for GI

## 2023-11-12 ENCOUNTER — Ambulatory Visit

## 2023-11-12 DIAGNOSIS — F32A Depression, unspecified: Secondary | ICD-10-CM | POA: Diagnosis not present

## 2023-11-12 DIAGNOSIS — F4310 Post-traumatic stress disorder, unspecified: Secondary | ICD-10-CM | POA: Diagnosis not present

## 2023-11-12 DIAGNOSIS — F419 Anxiety disorder, unspecified: Secondary | ICD-10-CM | POA: Diagnosis not present

## 2023-11-13 ENCOUNTER — Encounter: Attending: Physical Medicine and Rehabilitation | Admitting: Physical Medicine and Rehabilitation

## 2023-11-13 ENCOUNTER — Encounter: Payer: Self-pay | Admitting: Physical Medicine and Rehabilitation

## 2023-11-13 VITALS — BP 133/87 | HR 100 | Ht 65.0 in | Wt 195.0 lb

## 2023-11-13 DIAGNOSIS — M5430 Sciatica, unspecified side: Secondary | ICD-10-CM | POA: Insufficient documentation

## 2023-11-13 DIAGNOSIS — R202 Paresthesia of skin: Secondary | ICD-10-CM | POA: Diagnosis not present

## 2023-11-13 DIAGNOSIS — R4189 Other symptoms and signs involving cognitive functions and awareness: Secondary | ICD-10-CM | POA: Insufficient documentation

## 2023-11-13 DIAGNOSIS — M797 Fibromyalgia: Secondary | ICD-10-CM | POA: Diagnosis not present

## 2023-11-13 DIAGNOSIS — G4701 Insomnia due to medical condition: Secondary | ICD-10-CM | POA: Insufficient documentation

## 2023-11-13 DIAGNOSIS — E66811 Obesity, class 1: Secondary | ICD-10-CM | POA: Diagnosis not present

## 2023-11-13 DIAGNOSIS — R200 Anesthesia of skin: Secondary | ICD-10-CM | POA: Diagnosis not present

## 2023-11-13 MED ORDER — FAMOTIDINE 20 MG PO TABS: 20.0000 mg | ORAL_TABLET | Freq: Two times a day (BID) | ORAL | 3 refills | Status: DC | PRN

## 2023-11-13 MED ORDER — TOPIRAMATE 25 MG PO TABS
25.0000 mg | ORAL_TABLET | Freq: Every evening | ORAL | 3 refills | Status: DC
Start: 2023-11-13 — End: 2023-11-23

## 2023-11-13 NOTE — Progress Notes (Signed)
 Subjective:    Patient ID: Laurie Casey, female    DOB: Jan 06, 1986, 38 y.o.   MRN: [id number]  HPI  1) Fibromyalgia: -has 4 kids- first when she was 27 -has had fibro for 7 years -she was in a very toxic marriage with someone who was 100 percent disabled from the [employer] -she left her marriage with her kids in the middle of the night -she lost function in the right arm -she does not have contact with her immediate family  2) Insomnia: -she takes Seroquel   Pain Inventory Average Pain 7 Pain Right Now 7 My pain is intermittent, sharp, burning, dull, stabbing, tingling, and aching  In the last 24 hours, has pain interfered with the following? General activity 8 Relation with others 1 Enjoyment of life 10 What TIME of day is your pain at its worst? morning , daytime, evening, and night Sleep (in general) Fair  Pain is worse with: walking, bending, sitting, inactivity, standing, unsure, and some activites Pain improves with: rest, heat/ice, and TENS Relief from Meds: 2  walk without assistance use a cane how many minutes can you walk? Less than 30 minutes ability to climb steps?  yes do you drive?  yes Do you have any goals in this area?  yes  not employed: date last employed 09/29/2023 I need assistance with the following:  household duties and shopping Do you have any goals in this area?  yes  bladder control problems weakness numbness tremor tingling trouble walking spasms dizziness confusion depression anxiety  Any changes since last visit?  no  Any changes since last visit?  no    Family History  Problem Relation Age of Onset   Hypertension Father    Social History   Socioeconomic History   Marital status: Significant Other    Spouse name: Not on file   Number of children: 3   Years of education: Not on file   Highest education level: Not on file  Occupational History   Occupation: Vet tech  Tobacco Use   Smoking status: Former     Current packs/day: 0.00    Types: Cigarettes    Quit date: 10/12/2020    Years since quitting: 3.0   Smokeless tobacco: Never   Tobacco comments:    off and on   Vaping Use   Vaping status: Never Used  Substance and Sexual Activity   Alcohol use: Not Currently   Drug use: Never   Sexual activity: Not on file  Other Topics Concern   Not on file  Social History Narrative   Not on file   Social Drivers of Health   Financial Resource Strain: Low Risk  (07/23/2023)   Overall Financial Resource Strain (CARDIA)    Difficulty of Paying Living Expenses: Not hard at all  Food Insecurity: No Food Insecurity (07/23/2023)   Hunger Vital Sign    Worried About Running Out of Food in the Last Year: Never true    Ran Out of Food in the Last Year: Never true  Transportation Needs: No Transportation Needs (07/23/2023)   PRAPARE - Administrator, Civil Service (Medical): No    Lack of Transportation (Non-Medical): No  Physical Activity: Sufficiently Active (07/23/2023)   Exercise Vital Sign    Days of Exercise per Week: 5 days    Minutes of Exercise per Session: 40 min  Stress: Stress Concern Present (07/23/2023)   [employer] of Occupational Health - Occupational Stress Questionnaire  Feeling of Stress : Very much  Social Connections: Socially Isolated (07/23/2023)   Social Connection and Isolation Panel [NHANES]    Frequency of Communication with Friends and Family: Never    Frequency of Social Gatherings with Friends and Family: Never    Attends Religious Services: Never    Database administrator or Organizations: No    Attends Banker Meetings: Never    Marital Status: Living with partner   Past Surgical History:  Procedure Laterality Date   CESAREAN SECTION  2013, 2015, 2019   x2   CESAREAN SECTION  03/24/2023   with partial historectomy with uterus, cervix and tube removal   CYSTOSCOPY  2008 or 2009   CYSTOSCOPY W/ URETERAL STENT PLACEMENT Right 11/29/2020    Procedure: CYSTOSCOPY WITH RETROGRADE PYELOGRAM/URETERAL STENT PLACEMENT;  Surgeon: Trent Frizzle, MD;  Location: [facility] OR;  Service: Urology;  Laterality: Right;   CYSTOSCOPY/URETEROSCOPY/HOLMIUM LASER/STENT PLACEMENT Right 12/13/2020   Procedure: CYSTOSCOPY RIGHT URETEROSCOPY/HOLMIUM LASER/STENT EXTRACTION AND RIGHT JJ STENT PLACEMENT, RIGHT RETROGRADE URETEROSCOPY;  Surgeon: Trent Frizzle, MD;  Location: [facility];  Service: Urology;  Laterality: Right;   URETER SURGERY     x2   WISDOM TOOTH EXTRACTION     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   Pre-eclampsia    PTSD (post-traumatic stress disorder) 06/30/2022   BP 133/87   Pulse 100   Ht 5\' 5"  (1.651 m)   Wt 195 lb (88.5 kg)   SpO2 97%   BMI 32.45 kg/m   Opioid Risk Score:   Fall Risk Score:  `1  Depression screen PHQ 2/9     11/13/2023   11:10 AM 07/23/2023    1:32 PM 05/22/2023   12:01 PM 05/22/2023   11:47 AM 10/01/2022    8:50 AM 08/11/2022    3:15 PM 06/30/2022    3:09 PM  Depression screen PHQ 2/9  Decreased Interest 1 1 1 1 1 2 2   Down, Depressed, Hopeless 1 1 1 1  0 1 2  PHQ - 2 Score 2 2 2 2 1 3 4   Altered sleeping 1 0   0 0 0  Tired, decreased energy 1 2 0 0 2 2 1   Change in appetite 3 0 1 1 1  0 0  Feeling bad or failure about yourself  0 0 0 0 0 0 1  Trouble concentrating 1 1 1 1  0 0 1  Moving slowly or fidgety/restless 1 0 0 0 0 0 0  Suicidal thoughts 0 0 3 0 0 0 0  PHQ-9 Score 9 5   4 5 7   Difficult doing work/chores  Somewhat difficult         Review of Systems  Cardiovascular:  Positive for leg swelling.  Gastrointestinal:  Positive for constipation and diarrhea.  Genitourinary:  Positive for frequency.   Musculoskeletal:        Trouble walking- off balance, pain in knees, hips, ankles  Neurological:  Positive for tremors, weakness and numbness.       Tingling  Psychiatric/Behavioral:  Positive for confusion.        Depression, anxiety  All other systems reviewed and are negative.  Objective:   Physical Exam Gen: no distress, normal appearing HEENT: oral mucosa pink and moist, NCAT Cardio: Reg rate Chest: normal effort, normal rate of breathing Abd: soft, non-distended Ext: no edema Psych: pleasant, normal affect Skin: intact Neuro: Alert and oriented x3 MSK: diffuse pain       Assessment & Plan:   1) Chronic Pain Syndrome secondary to fibromyalgia -discussed that she found a new partner that is supportive and his family is supportive -continue gabapenitn and cymbalta -continue PT, discussed that she is working on core strengthening, discussed aquatherapy  -discussed that she swam competitively as a child  Discussed extracorporeal shockwave therapy as a modality for treatment. Discussed that the device looks and feels like a massage gun and I would move it over the area of pain for about 10 minutes. The device releases sound waves to the area of pain and helps to improve blood flow and circulation to improve the healing process. Discuss that this initially induces inflammation and can sometimes cause short-term increase in pain. Discussed that we typically do three weekly treatments, but sometimes up to 6 if needed, and after 6 weeks long term benefits can sometimes be achieved. Discussed that this is an FDA approved device, but not covered by insurance and would cost $60 per session. Will scheduled patient for 6 consecutive appointments and can cancel latter three if benefits are achieved after first three sessions.    -discussed red light therapy  -discussed that she sees a psychiatrist and psychologist in [facility] -Discussed current symptoms  of pain and history of pain.  -Discussed benefits of exercise in reducing pain. -Discussed following foods that may reduce pain: 1) Ginger (especially studied for arthritis)- reduce leukotriene production to decrease inflammation 2) Blueberries- high in phytonutrients that decrease inflammation 3) Salmon- marine omega-3s reduce joint swelling and pain 4) Pumpkin seeds- reduce inflammation 5) dark chocolate- reduces inflammation 6) turmeric- reduces inflammation 7) tart cherries - reduce pain and stiffness 8) extra virgin olive oil - its compound olecanthal helps to block prostaglandins  9) chili peppers- can be eaten or applied topically via capsaicin 10) mint- helpful for headache, muscle aches, joint pain, and itching 11) garlic- reduces inflammation 12) Green tea- reduces inflammation and oxidative stress, helps with weight loss, may reduce the risk of cancer, recommend Double [employer] of Tea daily  2) Insomnia: -discussed that this is her related to her PTSD -continue Seroquel -discussed that she wakes up if her pain Link to further information on diet for chronic pain: http://www.bray.com/  -Try to go outside near sunrise -Get exercise during the day.  -Turn off all devices an hour before bedtime.  -Teas that can benefit: chamomile, valerian root, Brahmi (Bacopa) -Can consider over the counter melatonin, magnesium, and/or L-theanine. Melatonin is an anti-oxidant with multiple health benefits. Magnesium is involved in greater than 300 enzymatic reactions in the body and most of us  are deficient as our soil is often depleted. There are 7 different types of magnesium- Bioptemizer's is a supplement with all 7 types, and each has unique benefits. Magnesium can also help with constipation and anxiety.  -Pistachios naturally increase the production of melatonin -Cozy Earth bamboo bed sheets are free from  toxic chemicals.  -Tart cherry juice or a tart cherry supplement can improve sleep and soreness post-workout  3) Placenta previa:  -discussed that her tubes, uterus, and cervix were removed  4) PTSD: continue Seroquel, can consider replacing with topamax, discuss with psychology and  psychiatry  5) Anxiety/depression: continue gabapentin  and cymbalta  6) Allergies: pepcid prescribed

## 2023-11-13 NOTE — Patient Instructions (Addendum)
 Foods that may reduce pain: 1) Ginger (especially studied for arthritis)- reduce leukotriene production to decrease inflammation 2) Blueberries- high in phytonutrients that decrease inflammation 3) Salmon- marine omega-3s reduce joint swelling and pain 4) Pumpkin seeds- reduce inflammation 5) dark chocolate- reduces inflammation 6) turmeric- reduces inflammation 7) tart cherries - reduce pain and stiffness 8) extra virgin olive oil - its compound olecanthal helps to block prostaglandins  9) chili peppers- can be eaten or applied topically via capsaicin 10) mint- helpful for headache, muscle aches, joint pain, and itching 11) garlic- reduces inflammation 12) Green tea- reduces inflammation and oxidative stress, helps with weight loss, may reduce the risk of cancer, recommend Double Green Matcha [place] of Tea daily  Link to further information on diet for chronic pain: http://www.bray.com/   LDN (low dose naltrexone)  Insomnia: -Try to go outside near sunrise -Get exercise during the day.  -Turn off all devices an hour before bedtime.  -Teas that can benefit: chamomile, valerian root, Brahmi (Bacopa) -Can consider over the counter melatonin, magnesium, and/or L-theanine. Melatonin is an anti-oxidant with multiple health benefits. Magnesium is involved in greater than 300 enzymatic reactions in the body and most of us  are deficient as our soil is often depleted. There are 7 different types of magnesium- Bioptemizer's is a supplement with all 7 types, and each has unique benefits. Magnesium can also help with constipation and anxiety.  -Pistachios naturally increase the production of melatonin -Cozy Earth bamboo bed sheets are free from toxic chemicals.  -Tart cherry juice or a tart cherry supplement can improve sleep and soreness post-workout  Magnesium glycinate 250mg  HS  Avoid processed foods, eat of lot of fruits  and vegetable Wahls Protocol, avoids gluten, dairy, eggs  Red light therapy

## 2023-11-16 ENCOUNTER — Encounter: Payer: Self-pay | Admitting: Physical Medicine and Rehabilitation

## 2023-11-16 ENCOUNTER — Ambulatory Visit (HOSPITAL_BASED_OUTPATIENT_CLINIC_OR_DEPARTMENT_OTHER): Admitting: Physical Therapy

## 2023-11-17 LAB — ANA,IFA RA DIAG PNL W/RFLX TIT/PATN
Cyclic Citrullin Peptide Ab: 8 U (ref 0–19)
Rheumatoid fact SerPl-aCnc: <10 [IU]/mL (ref <14.0)

## 2023-11-17 LAB — VITAMIN B12: Vitamin B-12: 434 pg/mL (ref 232–1245)

## 2023-11-17 NOTE — Therapy (Signed)
 OUTPATIENT PHYSICAL THERAPY LOWER EXTREMITY TREATMENT    Patient Name: Laurie Casey MRN: [id number] DOB:08-07-1985, 38 y.o., female Today's Date: 11/19/2023  END OF SESSION:  PT End of Session - 11/19/23 0927     Visit Number 6    Number of Visits 13    Date for PT Re-Evaluation 12/10/23    Authorization Type [employer]    Authorization Time Period 10/12/23 to 11/23/23    PT Start Time 0930    PT Stop Time 1015    PT Time Calculation (min) 45 min    Activity Tolerance Patient tolerated treatment well    Behavior During Therapy [facility] for tasks assessed/performed                  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   Pre-eclampsia    PTSD (post-traumatic stress disorder) 06/30/2022   Past Surgical History:  Procedure Laterality Date   CESAREAN SECTION  2013, 2015, 2019   x2   CESAREAN SECTION  03/24/2023   with partial historectomy with uterus, cervix and tube removal   CYSTOSCOPY  2008 or 2009   CYSTOSCOPY W/ URETERAL STENT PLACEMENT Right 11/29/2020   Procedure: CYSTOSCOPY WITH RETROGRADE PYELOGRAM/URETERAL STENT PLACEMENT;  Surgeon: Trent Frizzle, MD;  Location: [facility] OR;  Service: Urology;  Laterality: Right;   CYSTOSCOPY/URETEROSCOPY/HOLMIUM LASER/STENT PLACEMENT Right 12/13/2020   Procedure: CYSTOSCOPY RIGHT URETEROSCOPY/HOLMIUM LASER/STENT EXTRACTION AND RIGHT JJ STENT PLACEMENT, RIGHT RETROGRADE URETEROSCOPY;  Surgeon: Trent Frizzle, MD;  Location: [facility] ORS;  Service: Urology;  Laterality: Right;   URETER SURGERY     x2   WISDOM TOOTH EXTRACTION     Patient Active Problem List   Diagnosis Date Noted   Placenta accreta 12/03/2022   History of cesarean section  10/01/2022   Essential hypertension 08/11/2022   Obesity (BMI 30.0-34.9) 08/11/2022   High risk multigravida in third trimester 09/10/2017   Hx of preeclampsia, prior pregnancy, currently pregnant 03/16/2014    PCP: Lawrance Presume, MD  REFERRING PROVIDER: Lawrance Presume, MD  REFERRING DIAG:  Diagnosis  M79.7 (ICD-10-CM) - Fibromyalgia    THERAPY DIAG:  Muscle weakness (generalized)  Unsteadiness on feet  Fibromyalgia  Rationale for Evaluation and Treatment: Rehabilitation  ONSET DATE: chronic   SUBJECTIVE:   SUBJECTIVE STATEMENT: I took ibuprofen  and gabapentin  and tylenol  about 45 mins ago, so I am better right now. My sciatica was bad so I did some stretches. Sitting at a 6 right now. I saw a pain specialist last week, they put me on a Wall's protocol and diet.   EVAL: I've been in constant flare up since February 7th due to some trauma at work. I can't feel my knees or hips and they hurt very badly. I get a lot of different symptoms, tingling, blurred vision, dizziness. I do take Xanax  as needed and that does calm some things down. My hands will just stop working (2-3 times a day), usually happens when they're supposed to be working. Quit my job March 18th, things have gotten better. I'm a mom of four so always have some sort of stress. Had a large flare up before with my ex-husband, at the  time I had my partner handle all interactions with him and it went away on its own with time. I do see a formal psychiatrist and counselor. Tend to tolerate the TM better than elliptical, did try the gym to work it out but still had a lot of pain for 3-4 days after the TM too. I have tried water  exercise before and it worked well but gym membership will run out April 28th.   PERTINENT HISTORY: See above  PAIN:  Are you having pain? Yes: NPRS scale: 4/10 Pain location: all over  Pain description: "hard to explain", dull ache plus some  Aggravating factors: stress, exercise on TM  and elliptical  Relieving factors: TENS (has home unit), heat, Tizanidine     PRECAUTIONS: None  RED FLAGS: None   WEIGHT BEARING RESTRICTIONS: No  FALLS:  Has patient fallen in last 6 months? No  LIVING ENVIRONMENT: Lives with: lives with their partner, lives with their son, and lives with their daughter Lives in: House/apartment Stairs: 3 STE home  Has following equipment at home: None  OCCUPATION: unemployed   PLOF: Independent, Independent with basic ADLs, Independent with gait, and Independent with transfers  PATIENT GOALS: be more stable   NEXT MD VISIT:no appt scheduled   OBJECTIVE:  Note: Objective measures were completed at Evaluation unless otherwise noted.    PATIENT SURVEYS:  ABC scale 41.9%  COGNITION: Overall cognitive status: Within functional limits for tasks assessed       POSTURE: rounded shoulders, forward head, increased thoracic kyphosis, and flexed trunk      LOWER EXTREMITY MMT:  MMT Right eval Left eval  Hip flexion 3 3  Hip extension    Hip abduction 4 3  Hip adduction    Hip internal rotation    Hip external rotation    Knee flexion 4 4  Knee extension 4 4  Ankle dorsiflexion 4 cogwheeling  4 cogwheeling   Ankle plantarflexion    Ankle inversion    Ankle eversion     (Blank rows = not tested)    FUNCTIONAL TESTS:  5 times sit to stand: 21.86 seconds intermittent use of UEs  Dynamic Gait Index: 14    10/12/23 0001  Standardized Balance Assessment  Standardized Balance Assessment Dynamic Gait Index  Dynamic Gait Index  Level Surface 2  Change in Gait Speed 2  Gait with Horizontal Head Turns 2  Gait with Vertical Head Turns 2  Gait and Pivot Turn 1  Step Over Obstacle 2  Step Around Obstacles 2  Steps 1  Total Score 14       GAIT: Distance walked: in clinic distances  Assistive device utilized: None Level of assistance: Complete Independence Comments: very stiff, antalgic, mildly unsteady                                                                                                                                  TREATMENT DATE:  11/19/23 Calf stretch 30s x2 Step ups 6" Shoulder ext 5# 2x10 Side steps on airex Step up on airex  Supine stretches LTR, HS, SKTC, glutes, piriformis  PPT x10 Isometric AB with ball x10 AB roll up with ball  Modified deadbug legs only x10 alt  STS with chest press 4# 2x10   11/06/23 NuStep L5x72mins  STS 2x8 Hip abd with green band 2x10 Seated marching with green band 2x10 Ball squeezes 2x10 Supine stretches LTR, HS, SKTC  Feet on pball rotations, knees to chest Bridges 2x10    10/29/23  Nustep L4 x8 minute BLEs only for w/u and LE mm strength/endurance STS with red TB above knees x10 Standing hip ABD red TB above knees x10 Farmers carry 12# 32ft (2 rounds) Forward lunges onto BOSU x10 B  Tandem walks forward and backwards in // bars solid surface x4 laps  Side steps along blue foam pad x2 laps, then added stepping over targets while side stepping on foam x3 rounds  Wide tandem with one foot on soft surface of BOSU and other on blue foam pad 3x30 seconds B     10/27/23  Nustep L4-5x8 minutes for w/u, tissue perfusion, endurance  Walking with 5# each LE 281ft  SAQs 5# 12x3 second holds Prone HS curls 5# x12 B Prone hip extensions x12 B knee bent 5# LAQs 5# x12 B  Tandem stance solid surface 2x30 seconds B Side steps along blue foam pad x3 laps intermittent UE touch on bars  Side steps on blue foam pad with alternating toe taps to targets x6 laps        PATIENT EDUCATION:  Education details: as above  Person educated: Patient Education method: Explanation and Verbal cues Education comprehension: verbalized understanding, returned demonstration, and needs further education  HOME EXERCISE PROGRAM:  Access Code: [email or web address] URL: [email or web address] Date: 10/29/2023 Prepared by: Terrel Ferries  Exercises -  Supine Bridge with Resistance Band  - 1 x daily - 4-5 x weekly - 1 sets - 10 reps - 1 seconds  hold - Clamshell with Resistance  - 1 x daily - 4-5 x weekly - 1 sets - 10 reps - 1 second  hold - Tandem Stance in Corner  - 1 x daily - 7 x weekly - 1 sets - 4 reps - 30 seconds  hold - Seated Scapular Retraction  - 1 x daily - 7 x weekly - 1 sets - 10 reps - 3 seconds  hold - Seated Thoracic Extension with Hands Behind Neck  - 1 x daily - 7 x weekly - 1 sets - 10 reps - 1 second  hold - Sit to Stand with Resistance Around Legs  - 1 x daily - 7 x weekly - 1 sets - 10 reps - Standing Hip Abduction with Resistance at Thighs  - 1 x daily - 7 x weekly - 1 sets - 10 reps - Tandem Walking with Counter Support  - 1 x daily - 7 x weekly - 1 sets - 5 reps - Backward Tandem Walking with Counter Support  - 1 x daily - 7 x weekly - 1 sets - 5 reps     ASSESSMENT:  CLINICAL IMPRESSION: Patient arrives with 6/10 pain, she took medicine before coming in and states it is better than what it was this morning. Patient expressed interest in core strengthening as she has been through 4 c-sections and the most recently was less than a year ago where they made the incision vertically.  We went over some core activation exercises. Modified deadbugs were the most difficult for her. Will benefit from ongoing stretching, strengthening, balance training and pain management.     EVAL: Patient is a 38 y.o. F who was seen today for physical therapy evaluation and treatment for  Diagnosis  M79.7 (ICD-10-CM) - Fibromyalgia  . Of note a lot of her symptoms do seem to be highly related to stress and anxiety, she is getting skilled care in this realm. She has had a similar flare up in the past with a highly traumatic event. Objective measures as above. I think in terms of skilled PT services, water  therapy would definitely be in her best interest as pain flared very quickly even with basic measures taken on land today. She is  agreeable to water  PT, will work to arrange this for her.   OBJECTIVE IMPAIRMENTS: Abnormal gait, decreased activity tolerance, decreased balance, decreased coordination, decreased knowledge of use of DME, decreased mobility, difficulty walking, decreased strength, and pain.   ACTIVITY LIMITATIONS: standing, squatting, stairs, transfers, locomotion level, and caring for others  PARTICIPATION LIMITATIONS: driving, shopping, community activity, occupation, and yard work  PERSONAL FACTORS: Age, Behavior pattern, Education, Fitness, Past/current experiences, Sex, Social background, and Time since onset of injury/illness/exacerbation are also affecting patient's functional outcome.   REHAB POTENTIAL: Fair chronic fibro, highly irritable pain levels   CLINICAL DECISION MAKING: Evolving/moderate complexity  EVALUATION COMPLEXITY: Moderate   GOALS: Goals reviewed with patient? No  SHORT TERM GOALS: Target date: 11/02/2023   Will be compliant with appropriate progressive HEP  Baseline: Goal status: MET 10/29/23  2.  Will be able to ambulate at least 1045ft in community with LRAD, no unsteadiness and rest breaks PRN with normalized gait pattern  Baseline:  Goal status: ONGOING 10/29/23 gait pattern and steadiness have improved, she has not tried longer distance walking on her own yet       LONG TERM GOALS: Target date: 11/23/2023    MMT to have improved by one grade all weak groups  Baseline:  Goal status: INITIAL  2.  Will be able to ambulate unlimited distances in community, no device, rest breaks PRN, with normalized gait pattern  Baseline:  Goal status: INITIAL  3.  Will complete 5xSTS in 15 seconds or less no UEs to show improved mobility  Baseline:  Goal status: INITIAL  4.  Will score at least 20/24 on DGI to show improved functional balance  Baseline:  Goal status: INITIAL  5.  ABC score to improve by at least 20% to show improved confidence with mobility  Baseline:   Goal status: INITIAL     PLAN:  PT FREQUENCY: 1-2x/week  PT DURATION: 6 weeks  PLANNED INTERVENTIONS: 97110-Therapeutic exercises, 97530- Therapeutic activity, [id number]- Neuromuscular re-education, 97535- Self Care, 16109- Manual therapy, and [phone]- Aquatic Therapy  PLAN FOR NEXT SESSION: get her moving as much as possible/as tolerated- strength, balance, activity tolerance, core strengthening    Donavon Fudge, PT, DPT 11/19/23 10:15 AM

## 2023-11-18 ENCOUNTER — Ambulatory Visit (HOSPITAL_BASED_OUTPATIENT_CLINIC_OR_DEPARTMENT_OTHER): Admitting: Physical Therapy

## 2023-11-18 DIAGNOSIS — F419 Anxiety disorder, unspecified: Secondary | ICD-10-CM | POA: Diagnosis not present

## 2023-11-18 DIAGNOSIS — F32A Depression, unspecified: Secondary | ICD-10-CM | POA: Diagnosis not present

## 2023-11-18 DIAGNOSIS — F4310 Post-traumatic stress disorder, unspecified: Secondary | ICD-10-CM | POA: Diagnosis not present

## 2023-11-19 ENCOUNTER — Ambulatory Visit: Attending: Internal Medicine

## 2023-11-19 DIAGNOSIS — R2681 Unsteadiness on feet: Secondary | ICD-10-CM | POA: Insufficient documentation

## 2023-11-19 DIAGNOSIS — M6281 Muscle weakness (generalized): Secondary | ICD-10-CM | POA: Insufficient documentation

## 2023-11-19 DIAGNOSIS — M797 Fibromyalgia: Secondary | ICD-10-CM | POA: Insufficient documentation

## 2023-11-20 ENCOUNTER — Ambulatory Visit: Payer: Medicaid Other | Attending: Internal Medicine | Admitting: Internal Medicine

## 2023-11-20 ENCOUNTER — Other Ambulatory Visit: Payer: Self-pay | Admitting: Internal Medicine

## 2023-11-20 ENCOUNTER — Encounter: Payer: Self-pay | Admitting: Internal Medicine

## 2023-11-20 VITALS — BP 121/87 | HR 101 | Ht 65.0 in | Wt 192.4 lb

## 2023-11-20 DIAGNOSIS — T7849XA Other allergy, initial encounter: Secondary | ICD-10-CM

## 2023-11-20 DIAGNOSIS — F32A Depression, unspecified: Secondary | ICD-10-CM

## 2023-11-20 DIAGNOSIS — F418 Other specified anxiety disorders: Secondary | ICD-10-CM | POA: Diagnosis not present

## 2023-11-20 DIAGNOSIS — E669 Obesity, unspecified: Secondary | ICD-10-CM | POA: Diagnosis not present

## 2023-11-20 DIAGNOSIS — M797 Fibromyalgia: Secondary | ICD-10-CM

## 2023-11-20 DIAGNOSIS — Z6832 Body mass index (BMI) 32.0-32.9, adult: Secondary | ICD-10-CM | POA: Diagnosis not present

## 2023-11-20 DIAGNOSIS — I10 Essential (primary) hypertension: Secondary | ICD-10-CM | POA: Diagnosis not present

## 2023-11-20 MED ORDER — EPINEPHRINE 0.3 MG/0.3ML IJ SOAJ: 0.3000 mg | INTRAMUSCULAR | 1 refills | Status: DC | PRN

## 2023-11-20 MED ORDER — AMLODIPINE BESYLATE 10 MG PO TABS
10.0000 mg | ORAL_TABLET | Freq: Every day | ORAL | 1 refills | Status: DC
Start: 2023-11-20 — End: 2024-02-24

## 2023-11-20 NOTE — Progress Notes (Signed)
 Patient ID: Laurie Casey, female    DOB: Nov 30, 1985  MRN: [id number]  CC: Medical Management of Chronic Issues (Recent allergic reaction to super glue)   Subjective: Laurie Casey is a 38 y.o. female who presents for chronic ds management. Her concerns today include:  Patient with history of asthma, former smoker, anxiety disorder, migraines, preeclampsia, ureteral stone, PTSD/GAD/MDD (Followed by psychiatrist Laurie Knight, MD and therapist Laurie Casey at [facility]).   Discussed the use of AI scribe software for clinical note transcription with the patient, who gave verbal consent to proceed.  History of Present Illness Laurie Casey is a 38 year old female with fibromyalgia and hypertension who presents for follow-up on her chronic pain management and blood pressure control.  She was referred to neurology for weakness in her extremities.  She saw Dr. Salli Casey.  Workup included normal ANA and rheumatoid factor.  B12 level was in normal range but low normal.  MRI of the head has been ordered.  She has also gotten in with physical therapy and PMR Dr. Alessandra Casey.  She tells me that Dr. Alessandra Casey started her Topamax  and a specific diet avoiding dairy, caffeine, sugar, eggs, and processed foods, and has lost weight from 200 to 192 pounds. She decreased her Cymbalta dosage to evaluate the diet's effect on fibromyalgia pain.  Patient stopped Seroquel.  She left her job in mid-March, reducing stress and pain levels. Her primary exercise is gardening and caring for her children. she attends weekly physical therapy for core and hip strength, which is beneficial. Current medications include Cymbalta 30 mg daily, gabapentin  600 mg twice daily, bupropion, Zanaflex  as needed, Topamax  at night, and low-dose naltrexone for fibromyalgia. She takes amlodipine  for hypertension, with blood pressure readings sometimes reaching 160/95, and limits salt intake.  She experienced a severe  allergic reaction to adhesive a few weeks ago.  She was making something involve the use of adhesive glue.  She and was wearing latex gloves, that broke and hand came in contact with the adhesive.  Several hours later she developed symptoms of shortness of breath, hives, itching.  managed at home using Zyrtec and a nebulizer.  She has never had an allergic reaction this bad or severe    Patient Active Problem List   Diagnosis Date Noted   Placenta accreta 12/03/2022   History of cesarean section 10/01/2022   Essential hypertension 08/11/2022   Obesity (BMI 30.0-34.9) 08/11/2022   High risk multigravida in third trimester 09/10/2017   Hx of preeclampsia, prior pregnancy, currently pregnant 03/16/2014     Current Outpatient Medications on File Prior to Visit  Medication Sig Dispense Refill   buPROPion (WELLBUTRIN XL) 300 MG 24 hr tablet Take 300 mg by mouth every morning.     DULoxetine (CYMBALTA) 30 MG capsule Take 30 mg by mouth 2 (two) times daily.     famotidine  (PEPCID ) 20 MG tablet Take 1 tablet (20 mg total) by mouth 2 (two) times daily as needed for heartburn or indigestion. 90 tablet 3   gabapentin  (NEURONTIN ) 600 MG tablet Take 600 mg by mouth 2 (two) times daily.     Multiple Vitamin (MULTIVITAMIN) tablet Take 1 tablet by mouth daily.     tiZANidine  (ZANAFLEX ) 2 MG tablet Take 1 tablet (2 mg total) by mouth 2 (two) times daily as needed for muscle spasms. 30 tablet 1   topiramate  (TOPAMAX ) 25 MG tablet Take 1 tablet (25 mg total) by mouth at bedtime. 90 tablet  3   XANAX  0.25 MG tablet Take 0.25 mg by mouth as needed.     No current facility-administered medications on file prior to visit.    Allergies  Allergen Reactions   Tape Rash    Adhesive    Social History   Socioeconomic History   Marital status: Significant Other    Spouse name: Not on file   Number of children: 3   Years of education: Not on file   Highest education level: Not on file  Occupational History    Occupation: Vet tech  Tobacco Use   Smoking status: Former    Current packs/day: 0.00    Types: Cigarettes    Quit date: 10/12/2020    Years since quitting: 3.1   Smokeless tobacco: Never   Tobacco comments:    off and on   Vaping Use   Vaping status: Never Used  Substance and Sexual Activity   Alcohol use: Not Currently   Drug use: Never   Sexual activity: Not on file  Other Topics Concern   Not on file  Social History Narrative   Not on file   Social Drivers of Health   Financial Resource Strain: Low Risk  (07/23/2023)   Overall Financial Resource Strain (CARDIA)    Difficulty of Paying Living Expenses: Not hard at all  Food Insecurity: No Food Insecurity (07/23/2023)   Hunger Vital Sign    Worried About Running Out of Food in the Last Year: Never true    Ran Out of Food in the Last Year: Never true  Transportation Needs: No Transportation Needs (07/23/2023)   PRAPARE - Administrator, Civil Service (Medical): No    Lack of Transportation (Non-Medical): No  Physical Activity: Sufficiently Active (07/23/2023)   Exercise Vital Sign    Days of Exercise per Week: 5 days    Minutes of Exercise per Session: 40 min  Stress: Stress Concern Present (07/23/2023)   [employer] of Occupational Health - Occupational Stress Questionnaire    Feeling of Stress : Very much  Social Connections: Socially Isolated (07/23/2023)   Social Connection and Isolation Panel [NHANES]    Frequency of Communication with Friends and Family: Never    Frequency of Social Gatherings with Friends and Family: Never    Attends Religious Services: Never    Database administrator or Organizations: No    Attends Banker Meetings: Never    Marital Status: Living with partner  Intimate Partner Violence: Not At Risk (07/23/2023)   Humiliation, Afraid, Rape, and Kick questionnaire    Fear of Current or Ex-Partner: No    Emotionally Abused: No    Physically Abused: No    Sexually Abused:  No    Family History  Problem Relation Age of Onset   Hypertension Father     Past Surgical History:  Procedure Laterality Date   CESAREAN SECTION  2013, 2015, 2019   x2   CESAREAN SECTION  03/24/2023   with partial historectomy with uterus, cervix and tube removal   CYSTOSCOPY  2008 or 2009   CYSTOSCOPY W/ URETERAL STENT PLACEMENT Right 11/29/2020   Procedure: CYSTOSCOPY WITH RETROGRADE PYELOGRAM/URETERAL STENT PLACEMENT;  Surgeon: Trent Frizzle, MD;  Location: [facility] OR;  Service: Urology;  Laterality: Right;   CYSTOSCOPY/URETEROSCOPY/HOLMIUM LASER/STENT PLACEMENT Right 12/13/2020   Procedure: CYSTOSCOPY RIGHT URETEROSCOPY/HOLMIUM LASER/STENT EXTRACTION AND RIGHT JJ STENT PLACEMENT, RIGHT RETROGRADE URETEROSCOPY;  Surgeon: Trent Frizzle, MD;  Location: [facility];  Service: Urology;  Laterality: Right;  URETER SURGERY     x2   WISDOM TOOTH EXTRACTION      ROS: Review of Systems Negative except as stated above  PHYSICAL EXAM: BP 121/87   Pulse (!) 101   Ht 5\' 5"  (1.651 m)   Wt 192 lb 6.4 oz (87.3 kg)   SpO2 99%   BMI 32.02 kg/m   Wt Readings from Last 3 Encounters:  11/20/23 192 lb 6.4 oz (87.3 kg)  11/13/23 195 lb (88.5 kg)  11/10/23 196 lb (88.9 kg)    Physical Exam  General appearance - alert, well appearing, and in no distress Mental status - normal mood, behavior, speech, dress, motor activity, and thought processes Chest - clear to auscultation, no wheezes, rales or rhonchi, symmetric air entry Heart - normal rate, regular rhythm, normal S1, S2, no murmurs, rubs, clicks or gallops Extremities - peripheral pulses normal, no pedal edema, no clubbing or cyanosis      Latest Ref Rng & Units 10/01/2022   10:04 AM 08/20/2022    8:30 AM 04/14/2022   10:18 AM  CMP  Glucose 70 - 99 mg/dL 88  99  284   BUN 6 - 20 mg/dL 11  11  9    Creatinine 0.57 - 1.00 mg/dL 1.32  4.40  1.02   Sodium 134 - 144 mmol/L 137  134  139   Potassium 3.5 - 5.2 mmol/L 4.2  3.7  4.1    Chloride 96 - 106 mmol/L 106  102  108   CO2 20 - 29 mmol/L 16  23  21    Calcium 8.7 - 10.2 mg/dL 9.6  9.2  9.7   Total Protein 6.0 - 8.5 g/dL 6.7  6.8    Total Bilirubin 0.0 - 1.2 mg/dL 0.3  0.3    Alkaline Phos 44 - 121 IU/L 74  57    AST 0 - 40 IU/L 23  29    ALT 0 - 32 IU/L 16  27     Lipid Panel  No results found for: "CHOL", "TRIG", "HDL", "CHOLHDL", "VLDL", "LDLCALC", "LDLDIRECT"  CBC    Component Value Date/Time   WBC 14.0 (H) 10/01/2022 1004   WBC 5.4 08/20/2022 0830   RBC 4.19 10/01/2022 1004   RBC 4.07 08/20/2022 0830   HGB 13.4 10/01/2022 1004   HCT 39.4 10/01/2022 1004   PLT 169 10/01/2022 1004   MCV 94 10/01/2022 1004   MCH 32.0 10/01/2022 1004   MCH 31.0 08/20/2022 0830   MCHC 34.0 10/01/2022 1004   MCHC 33.6 08/20/2022 0830   RDW 12.9 10/01/2022 1004   LYMPHSABS 2.4 10/01/2022 1004   MONOABS 1.3 (H) 08/20/2022 0830   EOSABS 0.3 10/01/2022 1004   BASOSABS 0.1 10/01/2022 1004    ASSESSMENT AND PLAN: 1. Essential hypertension (Primary) Not at goal. Increase amlodipine  to 10 mg daily - amLODipine  (NORVASC ) 10 MG tablet; Take 1 tablet (10 mg total) by mouth daily.  Dispense: 90 tablet; Refill: 1  2. Anxiety and depression She is still plugged in with her behavioral health specialist.  She feels that she is in a much better place mentally since she quit her job and now working with PMR  3. Fibromyalgia Patient to continue exercise.  She is on Cymbalta but has recently decreased the dose to once a day.  She is also on gabapentin .  4. 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  I recommend carrying an  EpiPen.  We have referred to an allergist. - EPINEPHrine 0.3 mg/0.3 mL IJ SOAJ injection; Inject 0.3 mg into the muscle as needed for anaphylaxis.  Dispense: 1 each; Refill: 1 - Ambulatory referral to Allergy  5. Obesity (BMI 30-39.9) Commended her on weight loss so far.  Encouraged her to  continue healthy eating habits and regular exercise.  Topamax  that was recently started by physical medicine and rehab is also likely contributing.   Patient was given the opportunity to ask questions.  Patient verbalized understanding of the plan and was able to repeat key elements of the plan.   This documentation was completed using Paediatric nurse.  Any transcriptional errors are unintentional.  Orders Placed This Encounter  Procedures   Ambulatory referral to Allergy     Requested Prescriptions   Signed Prescriptions Disp Refills   amLODipine  (NORVASC ) 10 MG tablet 90 tablet 1    Sig: Take 1 tablet (10 mg total) by mouth daily.   EPINEPHrine 0.3 mg/0.3 mL IJ SOAJ injection 1 each 1    Sig: Inject 0.3 mg into the muscle as needed for anaphylaxis.    Return in about 4 months (around 03/22/2024).  Laurie Dee, MD, FACP

## 2023-11-20 NOTE — Patient Instructions (Signed)
 VISIT SUMMARY:  Today, we discussed your ongoing management for chronic pain and blood pressure control. We reviewed your current medications, diet, and physical therapy routine. We also addressed your recent allergic reaction and made adjustments to your treatment plan to better manage your conditions.  YOUR PLAN:  -HYPERTENSION: Hypertension means high blood pressure. Your diastolic pressure remains elevated, so we are increasing your amlodipine  dose to 10 mg daily. Please monitor your blood pressure twice a week with a goal of 130/80 mmHg or lower. A prescription for a 90-day supply of 10 mg amlodipine  has been sent to your pharmacy.  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 Anaphylaxis is a severe, potentially life-threatening allergic reaction. Please be seen in the ER if this happens again even if you have to call 911. We are prescribing an EpiPen for emergency use and referring you to an allergist for further evaluation and management.  -FIBROMYALGIA: Fibromyalgia is a condition characterized by widespread pain. You are managing it with physical therapy, dietary changes, and medications including gabapentin , bupropion, low-dose naltrexone, and topiramate . We are assessing the impact of your diet on pain by decreasing your duloxetine dosage.  -DEPRESSION: Depression is a mood disorder that causes persistent feelings of sadness and loss of interest. Your mental health is improving but still unstable due to recent medication changes. We will continue to monitor your progress.  -OBESITY: Obesity means having an excess amount of body fat. You have achieved weight loss with the [employer] Protocol diet and physical activity, including physical therapy and gardening. Continue with your current diet and exercise routine.  INSTRUCTIONS:  Please follow up with an allergist for further evaluation of your adhesive allergy. Continue to monitor your  blood pressure twice a week and aim for a reading of 130/80 mmHg or lower. If you experience any severe allergic reactions, use the prescribed EpiPen and seek emergency medical help immediately.

## 2023-11-23 ENCOUNTER — Ambulatory Visit
Admission: RE | Admit: 2023-11-23 | Discharge: 2023-11-23 | Disposition: A | Discharge status: Home / Self Care | Source: Ambulatory Visit | Attending: Diagnostic Neuroimaging | Admitting: Diagnostic Neuroimaging

## 2023-11-23 ENCOUNTER — Encounter: Payer: Self-pay | Admitting: Physical Medicine and Rehabilitation

## 2023-11-23 ENCOUNTER — Encounter: Admitting: Physical Medicine and Rehabilitation

## 2023-11-23 ENCOUNTER — Encounter (HOSPITAL_BASED_OUTPATIENT_CLINIC_OR_DEPARTMENT_OTHER): Admitting: Physical Medicine and Rehabilitation

## 2023-11-23 DIAGNOSIS — R200 Anesthesia of skin: Secondary | ICD-10-CM

## 2023-11-23 DIAGNOSIS — M5430 Sciatica, unspecified side: Secondary | ICD-10-CM | POA: Diagnosis not present

## 2023-11-23 DIAGNOSIS — R531 Weakness: Secondary | ICD-10-CM

## 2023-11-23 DIAGNOSIS — E66811 Obesity, class 1: Secondary | ICD-10-CM

## 2023-11-23 DIAGNOSIS — G894 Chronic pain syndrome: Secondary | ICD-10-CM

## 2023-11-23 DIAGNOSIS — G4701 Insomnia due to medical condition: Secondary | ICD-10-CM

## 2023-11-23 DIAGNOSIS — M797 Fibromyalgia: Secondary | ICD-10-CM

## 2023-11-23 MED ORDER — TOPIRAMATE 25 MG PO TABS: 25.0000 mg | ORAL_TABLET | Freq: Two times a day (BID) | ORAL | 3 refills | Status: DC | PRN

## 2023-11-23 MED ORDER — GADOPICLENOL 0.5 MMOL/ML IV SOLN
10.0000 mL | Freq: Once | INTRAVENOUS | Status: AC | PRN
  Administered 2023-11-23: 10 mL via INTRAVENOUS

## 2023-11-23 NOTE — Progress Notes (Signed)
 Subjective:    Patient ID: Laurie Casey, female    DOB: [birth year], 38 y.o.   MRN: [id number]  HPI An audio/video tele-health visit is felt to be the most appropriate encounter for this patient at this time. This is a follow up tele-visit via phone. The patient is at home. MD is at office. Prior to scheduling this appointment, our staff discussed the limitations of evaluation and management by telemedicine and the availability of in-person appointments. The patient expressed understanding and agreed to proceed.   1) Fibromyalgia: -has 4 kids- first when she was 66 -has had fibro for 7 years -she was in a very toxic marriage with someone who was 100 percent disabled from the [employer] -she left her marriage with her kids in the middle of the night -she lost function in the right arm -she does not have contact with her immediate family -she cut the cymbalta in half and it was hard at first, but now it is better  2) Insomnia: -she takes Seroquel  3) Obese: -she started a diet and has lost 5 lbs! -she started taking tart cherry juice at night  Pain Inventory Average Pain 7 Pain Right Now 7 My pain is intermittent, sharp, burning, dull, stabbing, tingling, and aching  In the last 24 hours, has pain interfered with the following? General activity 8 Relation with others 1 Enjoyment of life 10 What TIME of day is your pain at its worst? morning , daytime, evening, and night Sleep (in general) Fair  Pain is worse with: walking, bending, sitting, inactivity, standing, unsure, and some activites Pain improves with: rest, heat/ice, and TENS Relief from Meds: 2  walk without assistance use a cane how many minutes can you walk? Less than 30 minutes ability to climb steps?  yes do you drive?  yes Do you have any goals in this area?  yes  not employed: date last employed 09/29/2023 I need assistance with the following:  household duties and shopping Do you have any goals in this  area?  yes  bladder control problems weakness numbness tremor tingling trouble walking spasms dizziness confusion depression anxiety  Any changes since last visit?  no  Any changes since last visit?  no    Family History  Problem Relation Age of Onset   Hypertension Father    Social History   Socioeconomic History   Marital status: Significant Other    Spouse name: Not on file   Number of children: 3   Years of education: Not on file   Highest education level: Not on file  Occupational History   Occupation: Vet tech  Tobacco Use   Smoking status: Former    Current packs/day: 0.00    Types: Cigarettes    Quit date: 10/12/2020    Years since quitting: 3.1   Smokeless tobacco: Never   Tobacco comments:    off and on   Vaping Use   Vaping status: Never Used  Substance and Sexual Activity   Alcohol use: Not Currently   Drug use: Never   Sexual activity: Not on file  Other Topics Concern   Not on file  Social History Narrative   Not on file   Social Drivers of Health   Financial Resource Strain: Low Risk  (07/23/2023)   Overall Financial Resource Strain (CARDIA)    Difficulty of Paying Living Expenses: Not hard at all  Food Insecurity: No Food Insecurity (07/23/2023)   Hunger Vital Sign    Worried About  Running Out of Food in the Last Year: Never true    Ran Out of Food in the Last Year: Never true  Transportation Needs: No Transportation Needs (07/23/2023)   PRAPARE - Administrator, Civil Service (Medical): No    Lack of Transportation (Non-Medical): No  Physical Activity: Sufficiently Active (07/23/2023)   Exercise Vital Sign    Days of Exercise per Week: 5 days    Minutes of Exercise per Session: 40 min  Stress: Stress Concern Present (07/23/2023)   [employer] of Occupational Health - Occupational Stress Questionnaire    Feeling of Stress : Very much  Social Connections: Socially Isolated (07/23/2023)   Social Connection and Isolation Panel  [NHANES]    Frequency of Communication with Friends and Family: Never    Frequency of Social Gatherings with Friends and Family: Never    Attends Religious Services: Never    Database administrator or Organizations: No    Attends Banker Meetings: Never    Marital Status: Living with partner   Past Surgical History:  Procedure Laterality Date   CESAREAN SECTION  2013, 2015, 2019   x2   CESAREAN SECTION  03/24/2023   with partial historectomy with uterus, cervix and tube removal   CYSTOSCOPY  2008 or 2009   CYSTOSCOPY W/ URETERAL STENT PLACEMENT Right 11/29/2020   Procedure: CYSTOSCOPY WITH RETROGRADE PYELOGRAM/URETERAL STENT PLACEMENT;  Surgeon: Trent Frizzle, MD;  Location: [facility] OR;  Service: Urology;  Laterality: Right;   CYSTOSCOPY/URETEROSCOPY/HOLMIUM LASER/STENT PLACEMENT Right 12/13/2020   Procedure: CYSTOSCOPY RIGHT URETEROSCOPY/HOLMIUM LASER/STENT EXTRACTION AND RIGHT JJ STENT PLACEMENT, RIGHT RETROGRADE URETEROSCOPY;  Surgeon: Trent Frizzle, MD;  Location: [facility];  Service: Urology;  Laterality: Right;   URETER SURGERY     x2   WISDOM TOOTH EXTRACTION     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  Opioid Risk Score:   Fall Risk Score:  `1  Depression screen PHQ 2/9     11/13/2023   11:10 AM 07/23/2023    1:32 PM 05/22/2023   12:01 PM 05/22/2023   11:47 AM 10/01/2022    8:50 AM 08/11/2022    3:15 PM 06/30/2022    3:09 PM  Depression screen PHQ 2/9  Decreased Interest 1 1 1 1 1 2 2   Down, Depressed, Hopeless 1 1 1 1  0 1 2  PHQ - 2 Score 2 2 2 2 1 3 4    Altered sleeping 1 0   0 0 0  Tired, decreased energy 1 2 0 0 2 2 1   Change in appetite 3 0 1 1 1  0 0  Feeling bad or failure about yourself  0 0 0 0 0 0 1  Trouble concentrating 1 1 1 1  0 0 1  Moving slowly or fidgety/restless 1 0 0 0 0 0 0  Suicidal thoughts 0 0 3 0 0 0 0  PHQ-9 Score 9 5   4 5 7   Difficult doing work/chores  Somewhat difficult         Review of  Systems  Cardiovascular:  Positive for leg swelling.  Gastrointestinal:  Positive for constipation and diarrhea.  Genitourinary:  Positive for frequency.  Musculoskeletal:        Trouble walking- off balance, pain in knees, hips, ankles  Neurological:  Positive for tremors, weakness and numbness.       Tingling  Psychiatric/Behavioral:  Positive for confusion.        Depression, anxiety  All other systems reviewed and are negative.      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 -discussed that she found a new partner that is supportive and his family is supportive -continue gabapenitn and cymbalta -discussed that pain has improved since stopping sugar and caffeine -continue PT, discussed that she is working on core strengthening, discussed aquatherapy  -discussed that she swam competitively as a child  Discussed extracorporeal shockwave therapy as a modality for treatment. Discussed that the device looks and feels like a massage gun and I would move it over the area of pain for about 10 minutes. The device releases sound waves to the area of pain and helps to improve blood flow and circulation to improve the healing process. Discuss that this initially induces  inflammation and can sometimes cause short-term increase in pain. Discussed that we typically do three weekly treatments, but sometimes up to 6 if needed, and after 6 weeks long term benefits can sometimes be achieved. Discussed that this is an FDA approved device, but not covered by insurance and would cost $60 per session. Will scheduled patient for 6 consecutive appointments and can cancel latter three if benefits are achieved after first three sessions.    -discussed red light therapy  -discussed that she sees a psychiatrist and psychologist in [facility] -Discussed current symptoms of pain and history of pain.  -Discussed benefits of exercise in reducing pain. -Discussed following foods that may reduce pain: 1) Ginger (especially studied for arthritis)- reduce leukotriene production to decrease inflammation 2) Blueberries- high in phytonutrients that decrease inflammation 3) Salmon- marine omega-3s reduce joint swelling and pain 4) Pumpkin seeds- reduce inflammation 5) dark chocolate- reduces inflammation 6) turmeric- reduces inflammation 7) tart cherries - reduce pain and stiffness 8) extra virgin olive oil - its compound olecanthal helps to block prostaglandins  9) chili peppers- can be eaten or applied topically via capsaicin 10) mint- helpful for headache, muscle aches, joint pain, and itching 11) garlic- reduces inflammation 12) Green tea- reduces inflammation and oxidative stress, helps with weight loss, may reduce the risk of cancer, recommend Double [employer] of Tea daily  2) Insomnia: -continue tart cherry juice at night -encouraged napping during the day if her son is napping -discussed that sleep has been better -discussed that this is her related to her PTSD -continue Seroquel -discussed that she wakes up if her pain Link to further information on diet for chronic pain:  [email or web address]  -Try to go outside near sunrise -Get exercise during the day.  -Turn off all devices an hour before bedtime.  -Teas that can benefit: chamomile, valerian root, Brahmi (Bacopa) -Can consider over the counter melatonin, magnesium, and/or L-theanine. Melatonin is an anti-oxidant with  multiple health benefits. Magnesium is involved in greater than 300 enzymatic reactions in the body and most of us  are deficient as our soil is often depleted. There are 7 different types of magnesium- Bioptemizer's is a supplement with all 7 types, and each has unique benefits. Magnesium can also help with constipation and anxiety.  -Pistachios naturally increase the production of melatonin -[employer] bamboo bed sheets are free from toxic chemicals.  -Tart cherry juice or a tart cherry supplement can improve sleep and soreness post-workout  3) Placenta previa:  -discussed that her tubes, uterus, and cervix were removed  4) PTSD: continue Seroquel, can consider replacing with topamax , discuss with psychology and psychiatry  5) Anxiety/depression: continue gabapentin  and cymbalta  6) Allergies: pepcid  prescribed  7) Sciatica: -discussed that this has been rough the last couple of days -discussed that it is present on both sides  -discussed PT  6 minutes spent in discussion of her sciatica and how her therapists are focusing on hip and core strengthening, discussed that she is fine increasing the topamax  and has stopped the Xanax  and Seroquel, discussed that her mental health is important, commended her on losing 5 lbs! Commended her on weaning off sugar and caffeine!

## 2023-11-24 ENCOUNTER — Ambulatory Visit: Payer: Self-pay | Admitting: Diagnostic Neuroimaging

## 2023-11-25 DIAGNOSIS — F32A Depression, unspecified: Secondary | ICD-10-CM | POA: Diagnosis not present

## 2023-11-25 DIAGNOSIS — F419 Anxiety disorder, unspecified: Secondary | ICD-10-CM | POA: Diagnosis not present

## 2023-11-25 DIAGNOSIS — F4310 Post-traumatic stress disorder, unspecified: Secondary | ICD-10-CM | POA: Diagnosis not present

## 2023-11-26 ENCOUNTER — Other Ambulatory Visit: Payer: Self-pay | Admitting: *Deleted

## 2023-11-26 ENCOUNTER — Encounter: Payer: Self-pay | Admitting: Internal Medicine

## 2023-11-26 ENCOUNTER — Other Ambulatory Visit: Payer: Self-pay | Admitting: Internal Medicine

## 2023-11-26 MED ORDER — TIZANIDINE HCL 2 MG PO TABS: 2.0000 mg | ORAL_TABLET | Freq: Two times a day (BID) | ORAL | 4 refills | Status: DC | PRN
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
## 2023-11-27 ENCOUNTER — Ambulatory Visit

## 2023-11-27 DIAGNOSIS — M6281 Muscle weakness (generalized): Secondary | ICD-10-CM | POA: Diagnosis not present

## 2023-11-27 DIAGNOSIS — M797 Fibromyalgia: Secondary | ICD-10-CM | POA: Diagnosis not present

## 2023-11-27 DIAGNOSIS — R2681 Unsteadiness on feet: Secondary | ICD-10-CM

## 2023-12-01 DIAGNOSIS — F419 Anxiety disorder, unspecified: Secondary | ICD-10-CM | POA: Diagnosis not present

## 2023-12-01 DIAGNOSIS — F32A Depression, unspecified: Secondary | ICD-10-CM | POA: Diagnosis not present

## 2023-12-01 DIAGNOSIS — F4310 Post-traumatic stress disorder, unspecified: Secondary | ICD-10-CM | POA: Diagnosis not present

## 2023-12-04 ENCOUNTER — Encounter (HOSPITAL_BASED_OUTPATIENT_CLINIC_OR_DEPARTMENT_OTHER): Admitting: Physical Medicine and Rehabilitation

## 2023-12-04 DIAGNOSIS — R200 Anesthesia of skin: Secondary | ICD-10-CM | POA: Diagnosis not present

## 2023-12-04 DIAGNOSIS — R202 Paresthesia of skin: Secondary | ICD-10-CM | POA: Diagnosis not present

## 2023-12-04 DIAGNOSIS — R4189 Other symptoms and signs involving cognitive functions and awareness: Secondary | ICD-10-CM

## 2023-12-04 NOTE — Progress Notes (Signed)
 Subjective:    Patient ID: Laurie Casey, female    DOB: [birth year], 38 y.o.   MRN: [id number]  HPI An audio/video tele-health visit is felt to be the most appropriate encounter for this patient at this time. This is a follow up tele-visit via phone. The patient is at home. MD is at office. Prior to scheduling this appointment, our staff discussed the limitations of evaluation and management by telemedicine and the availability of in-person appointments. The patient expressed understanding and agreed to proceed.   1) Fibromyalgia: -has 4 kids- first when she was 43 -has had fibro for 7 years -she was in a very toxic marriage with someone who was 100 percent disabled from the [employer] -she left her marriage with her kids in the middle of the night -she lost function in the right arm -she does not have contact with her immediate family -she cut the cymbalta in half and it was hard at first, but now it is better  2) Insomnia: -she takes Seroquel  3) Obese: -she started a diet and has lost 5 lbs! -she started taking tart cherry juice at night  4) Numbness and tingling: -started in 2001 when she lost function of her arm -this does bother her  Pain Inventory Average Pain 7 Pain Right Now 7 My pain is intermittent, sharp, burning, dull, stabbing, tingling, and aching  In the last 24 hours, has pain interfered with the following? General activity 8 Relation with others 1 Enjoyment of life 10 What TIME of day is your pain at its worst? morning , daytime, evening, and night Sleep (in general) Fair  Pain is worse with: walking, bending, sitting, inactivity, standing, unsure, and some activites Pain improves with: rest, heat/ice, and TENS Relief from Meds: 2  walk without assistance use a cane how many minutes can you walk? Less than 30 minutes ability to climb steps?  yes do you drive?  yes Do you have any goals in this area?  yes  not employed: date last employed  09/29/2023 I need assistance with the following:  household duties and shopping Do you have any goals in this area?  yes  bladder control problems weakness numbness tremor tingling trouble walking spasms dizziness confusion depression anxiety  Any changes since last visit?  no  Any changes since last visit?  no    Family History  Problem Relation Age of Onset   Hypertension Father    Social History   Socioeconomic History   Marital status: Significant Other    Spouse name: Not on file   Number of children: 3   Years of education: Not on file   Highest education level: Not on file  Occupational History   Occupation: Vet tech  Tobacco Use   Smoking status: Former    Current packs/day: 0.00    Types: Cigarettes    Quit date: 10/12/2020    Years since quitting: 3.1   Smokeless tobacco: Never   Tobacco comments:    off and on   Vaping Use   Vaping status: Never Used  Substance and Sexual Activity   Alcohol use: Not Currently   Drug use: Never   Sexual activity: Not on file  Other Topics Concern   Not on file  Social History Narrative   Not on file   Social Drivers of Health   Financial Resource Strain: Low Risk  (07/23/2023)   Overall Financial Resource Strain (CARDIA)    Difficulty of Paying Living Expenses: Not hard  at all  Food Insecurity: No Food Insecurity (07/23/2023)   Hunger Vital Sign    Worried About Running Out of Food in the Last Year: Never true    Ran Out of Food in the Last Year: Never true  Transportation Needs: No Transportation Needs (07/23/2023)   PRAPARE - Administrator, Civil Service (Medical): No    Lack of Transportation (Non-Medical): No  Physical Activity: Sufficiently Active (07/23/2023)   Exercise Vital Sign    Days of Exercise per Week: 5 days    Minutes of Exercise per Session: 40 min  Stress: Stress Concern Present (07/23/2023)   [employer] of Occupational Health - Occupational Stress Questionnaire    Feeling of  Stress : Very much  Social Connections: Socially Isolated (07/23/2023)   Social Connection and Isolation Panel [NHANES]    Frequency of Communication with Friends and Family: Never    Frequency of Social Gatherings with Friends and Family: Never    Attends Religious Services: Never    Database administrator or Organizations: No    Attends Banker Meetings: Never    Marital Status: Living with partner   Past Surgical History:  Procedure Laterality Date   CESAREAN SECTION  2013, 2015, 2019   x2   CESAREAN SECTION  03/24/2023   with partial historectomy with uterus, cervix and tube removal   CYSTOSCOPY  2008 or 2009   CYSTOSCOPY W/ URETERAL STENT PLACEMENT Right 11/29/2020   Procedure: CYSTOSCOPY WITH RETROGRADE PYELOGRAM/URETERAL STENT PLACEMENT;  Surgeon: Trent Frizzle, MD;  Location: [facility];  Service: Urology;  Laterality: Right;   CYSTOSCOPY/URETEROSCOPY/HOLMIUM LASER/STENT PLACEMENT Right 12/13/2020   Procedure: CYSTOSCOPY RIGHT URETEROSCOPY/HOLMIUM LASER/STENT EXTRACTION AND RIGHT JJ STENT PLACEMENT, RIGHT RETROGRADE URETEROSCOPY;  Surgeon: Trent Frizzle, MD;  Location: [facility];  Service: Urology;  Laterality: Right;   URETER SURGERY     x2   WISDOM TOOTH EXTRACTION     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  Opioid Risk Score:   Fall Risk Score:  `1  Depression screen PHQ 2/9     11/13/2023   11:10 AM 07/23/2023    1:32 PM 05/22/2023   12:01 PM 05/22/2023   11:47 AM 10/01/2022    8:50 AM 08/11/2022    3:15 PM 06/30/2022    3:09 PM  Depression screen PHQ 2/9   Decreased Interest 1 1 1 1 1 2 2   Down, Depressed, Hopeless 1 1 1 1  0 1 2  PHQ - 2 Score 2 2 2 2 1 3 4   Altered sleeping 1 0   0 0 0  Tired, decreased energy 1 2 0 0 2 2 1   Change in appetite 3 0 1 1 1  0 0  Feeling bad or failure about yourself  0 0 0 0 0 0 1  Trouble concentrating 1 1 1 1  0 0 1  Moving slowly or fidgety/restless 1 0 0 0 0 0 0  Suicidal thoughts 0 0 3 0 0 0 0  PHQ-9 Score 9 5   4  5 7  Difficult doing work/chores  Somewhat difficult         Review of Systems  Cardiovascular:  Positive for leg swelling.  Gastrointestinal:  Positive for constipation and diarrhea.  Genitourinary:  Positive for frequency.  Musculoskeletal:        Trouble walking- off balance, pain in knees, hips, ankles  Neurological:  Positive for tremors, weakness and numbness.       Tingling  Psychiatric/Behavioral:  Positive for confusion.        Depression, anxiety  All other systems reviewed and are negative.      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 -discussed that she found a new partner that is supportive and his family is supportive -continue gabapenitn and cymbalta -discussed that pain has improved since stopping sugar and caffeine -continue PT, discussed that she is working on core strengthening, discussed aquatherapy  -discussed that she swam competitively as a child  Discussed extracorporeal shockwave therapy as a modality for treatment. Discussed that the device looks and feels like a massage gun and I would move it over the area of pain for about 10 minutes. The device releases sound waves to the area of pain and helps to  improve blood flow and circulation to improve the healing process. Discuss that this initially induces inflammation and can sometimes cause short-term increase in pain. Discussed that we typically do three weekly treatments, but sometimes up to 6 if needed, and after 6 weeks long term benefits can sometimes be achieved. Discussed that this is an FDA approved device, but not covered by insurance and would cost $60 per session. Will scheduled patient for 6 consecutive appointments and can cancel latter three if benefits are achieved after first three sessions.    -discussed red light therapy  -discussed that she sees a psychiatrist and psychologist in [facility] -Discussed current symptoms of pain and history of pain.  -Discussed benefits of exercise in reducing pain. -Discussed following foods that may reduce pain: 1) Ginger (especially studied for arthritis)- reduce leukotriene production to decrease inflammation 2) Blueberries- high in phytonutrients that decrease inflammation 3) Salmon- marine omega-3s reduce joint swelling and pain 4) Pumpkin seeds- reduce inflammation 5) dark chocolate- reduces inflammation 6) turmeric- reduces inflammation 7) tart cherries - reduce pain and stiffness 8) extra virgin olive oil - its compound olecanthal helps to block prostaglandins  9) chili peppers- can be eaten or applied topically via capsaicin 10) mint- helpful for headache, muscle aches, joint pain, and itching 11) garlic- reduces inflammation 12) Green tea- reduces inflammation and oxidative stress, helps with weight loss, may reduce the risk of cancer, recommend Double [employer] of Tea daily  2) Insomnia: -continue tart cherry juice at night -encouraged napping during the day if her son is napping -discussed that sleep has been better -discussed that this is her related to her PTSD -continue Seroquel -discussed that she wakes up if her pain Link to further  information on diet for chronic pain: http://www.bray.com/  -Try to go outside near sunrise -Get exercise during the day.  -Turn off all devices an hour before bedtime.  -Teas that can benefit:  chamomile, valerian root, Brahmi (Bacopa) -Can consider over the counter melatonin, magnesium, and/or L-theanine. Melatonin is an anti-oxidant with multiple health benefits. Magnesium is involved in greater than 300 enzymatic reactions in the body and most of us  are deficient as our soil is often depleted. There are 7 different types of magnesium- Bioptemizer's is a supplement with all 7 types, and each has unique benefits. Magnesium can also help with constipation and anxiety.  -Pistachios naturally increase the production of melatonin -Cozy Earth bamboo bed sheets are free from toxic chemicals.  -Tart cherry juice or a tart cherry supplement can improve sleep and soreness post-workout  3) Placenta previa:  -discussed that her tubes, uterus, and cervix were removed  4) PTSD: continue Seroquel, can consider replacing with topamax , discuss with psychology and psychiatry  5) Anxiety/depression: continue gabapentin  and cymbalta  6) Allergies: pepcid  prescribed  7) Sciatica: -discussed that this has been rough the last couple of days -discussed that it is present on both sides  -discussed PT  8) Numbness in back: -discussed that it also feels like bugs are crawling on her -Discussed Qutenza as an option for neuropathic pain control. Discussed that this is a capsaicin patch, stronger than capsaicin cream. Discussed that it is currently approved for diabetic peripheral neuropathy and post-herpetic neuralgia, but that it has also shown benefit in treating other forms of neuropathy. Provided patient with link to site to learn more about the patch: https://www.clark.biz/. Discussed that the patch would be placed in office and benefits  usually last 3 months. Discussed that unintended exposure to capsaicin can cause severe irritation of eyes, mucous membranes, respiratory tract, and skin, but that Qutenza is a local treatment and does not have the systemic side effects of other nerve medications. Discussed that there may be pain, itching, erythema, and decreased sensory function associated with the application of Qutenza. Side effects usually subside within 1 week. A cold pack of analgesic medications can help with these side effects. Blood pressure can also be increased due to pain associated with administration of the patch.   9) Brain fog: -discussed weaning off gabapentin  -discussed that she could try just taking gabapentin  at night  10) Overweight: -discussed that she feels really good with whole foods plant based diet  -discussed that she has lost 7 lbs!   10 minutes spent in discussion of the numbness in her back, that she feels bugs crawling on her back, discussed that lidocaine  patch was not helpful for her, discussed that it is painful and she has allodynia, she finds that topamax  is helpful and she takes it twice per day, discussed that gabapentin  can cause bran fog

## 2023-12-09 DIAGNOSIS — F4310 Post-traumatic stress disorder, unspecified: Secondary | ICD-10-CM | POA: Diagnosis not present

## 2023-12-09 DIAGNOSIS — F32A Depression, unspecified: Secondary | ICD-10-CM | POA: Diagnosis not present

## 2023-12-09 DIAGNOSIS — F419 Anxiety disorder, unspecified: Secondary | ICD-10-CM | POA: Diagnosis not present

## 2023-12-14 ENCOUNTER — Ambulatory Visit: Attending: Internal Medicine | Admitting: Physical Therapy

## 2023-12-14 ENCOUNTER — Telehealth: Payer: Self-pay | Admitting: Physical Therapy

## 2023-12-14 DIAGNOSIS — M6281 Muscle weakness (generalized): Secondary | ICD-10-CM | POA: Insufficient documentation

## 2023-12-14 DIAGNOSIS — R2681 Unsteadiness on feet: Secondary | ICD-10-CM | POA: Insufficient documentation

## 2023-12-14 DIAGNOSIS — M797 Fibromyalgia: Secondary | ICD-10-CM | POA: Insufficient documentation

## 2023-12-14 NOTE — Telephone Encounter (Signed)
 No-show, called and spoke to pt- she apologizes as she did not get a reminder notification from [facility] and forgot. Discussed next appt, she plans to attend.   Terrel Ferries, PT, DPT 12/14/23 8:19 AM

## 2023-12-16 DIAGNOSIS — F419 Anxiety disorder, unspecified: Secondary | ICD-10-CM | POA: Diagnosis not present

## 2023-12-16 DIAGNOSIS — F4310 Post-traumatic stress disorder, unspecified: Secondary | ICD-10-CM | POA: Diagnosis not present

## 2023-12-16 DIAGNOSIS — F32A Depression, unspecified: Secondary | ICD-10-CM | POA: Diagnosis not present

## 2023-12-17 ENCOUNTER — Encounter: Payer: Self-pay | Admitting: Physical Therapy

## 2023-12-17 ENCOUNTER — Ambulatory Visit: Admitting: Physical Therapy

## 2023-12-17 DIAGNOSIS — M6281 Muscle weakness (generalized): Secondary | ICD-10-CM | POA: Diagnosis not present

## 2023-12-17 DIAGNOSIS — R2681 Unsteadiness on feet: Secondary | ICD-10-CM | POA: Diagnosis not present

## 2023-12-17 DIAGNOSIS — M797 Fibromyalgia: Secondary | ICD-10-CM | POA: Diagnosis not present
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
## 2023-12-18 ENCOUNTER — Encounter: Payer: Self-pay | Admitting: Physical Medicine and Rehabilitation

## 2023-12-18 ENCOUNTER — Encounter: Payer: Self-pay | Admitting: Internal Medicine

## 2023-12-20 ENCOUNTER — Emergency Department (HOSPITAL_BASED_OUTPATIENT_CLINIC_OR_DEPARTMENT_OTHER)

## 2023-12-20 ENCOUNTER — Encounter (HOSPITAL_BASED_OUTPATIENT_CLINIC_OR_DEPARTMENT_OTHER): Payer: Self-pay | Admitting: Emergency Medicine

## 2023-12-20 ENCOUNTER — Other Ambulatory Visit: Payer: Self-pay

## 2023-12-20 ENCOUNTER — Emergency Department (HOSPITAL_BASED_OUTPATIENT_CLINIC_OR_DEPARTMENT_OTHER)
Admission: EM | Admit: 2023-12-20 | Discharge: 2023-12-20 | Disposition: A | Discharge status: Home / Self Care | Attending: Emergency Medicine | Admitting: Emergency Medicine

## 2023-12-20 DIAGNOSIS — S93401A Sprain of unspecified ligament of right ankle, initial encounter: Secondary | ICD-10-CM | POA: Diagnosis not present

## 2023-12-20 DIAGNOSIS — S99911A Unspecified injury of right ankle, initial encounter: Secondary | ICD-10-CM | POA: Diagnosis not present

## 2023-12-20 DIAGNOSIS — R200 Anesthesia of skin: Secondary | ICD-10-CM | POA: Insufficient documentation

## 2023-12-20 DIAGNOSIS — W109XXA Fall (on) (from) unspecified stairs and steps, initial encounter: Secondary | ICD-10-CM | POA: Diagnosis not present

## 2023-12-20 DIAGNOSIS — M25571 Pain in right ankle and joints of right foot: Secondary | ICD-10-CM | POA: Diagnosis present

## 2023-12-20 NOTE — Discharge Instructions (Signed)
 You were seen here today for your ankle pain.  It appears you have a ankle sprain which is a injury of the ligaments in your ankle.  This will heal with time.  Your x-ray today did not show any signs of a fracture or dislocation  You have been placed into a ankle brace to help with the pain.  You may wean out of the brace as tolerated. Please perform gentle range of motion exercises as provided (like "writing" the ABC's with your ankle) to prevent stiffness in the ankle and to strengthen the ankle.  Please follow-up with the orthopedic provider listed below within the next week for further management of your ankle sprain.  Please follow up with your PT within the next week for further management of your ankle sprain  You may take up to 1000mg  of tylenol  every 6 hours as needed for pain.  Do not take more then 4g per day.  You may use up to 600mg  ibuprofen  every 6 hours as needed for pain.  Do not exceed 2.4g of ibuprofen  per day.  You may ice and elevate the ankle to help with pain.  Return to the ER if you have any numbness or tingling in your foot, worsening pain, any other new or concerning symptoms.

## 2023-12-20 NOTE — ED Provider Notes (Signed)
 [place] EMERGENCY DEPARTMENT AT [facility] Provider Note   CSN: [id number] Arrival date & time: 12/20/23  1504     History  Chief Complaint  Patient presents with   Ankle Pain    Laurie Casey is a 38 y.o. female with a history of fibromyalgia, presents with concern for right lateral ankle pain.  States she stepped off the stairs wrong 3 days ago, and inverted her right ankle. She immediately had some pain and swelling to the lateral aspect of the right ankle.  She does report some baseline numbness in the right foot, no new numbness or tingling.  She has been able to walk on the right foot, but does have pain with particular movements such as pivoting.  HPI     Home Medications Prior to Admission medications   Medication Sig Start Date End Date Taking? Authorizing Provider  amLODipine  (NORVASC ) 10 MG tablet Take 1 tablet (10 mg total) by mouth daily. 11/20/23   Lawrance Presume, MD  buPROPion (WELLBUTRIN XL) 300 MG 24 hr tablet Take 300 mg by mouth every morning. 05/25/22   [provider]  DULoxetine (CYMBALTA) 30 MG capsule Take 30 mg by mouth 2 (two) times daily.    [provider]  EPINEPHrine  0.3 mg/0.3 mL IJ SOAJ injection Inject 0.3 mg into the muscle as needed for anaphylaxis. 11/20/23   Lawrance Presume, MD  famotidine  (PEPCID ) 20 MG tablet Take 1 tablet (20 mg total) by mouth 2 (two) times daily as needed for heartburn or indigestion. 11/13/23   Raulkar, Keven Pel, MD  gabapentin  (NEURONTIN ) 600 MG tablet Take 600 mg by mouth 2 (two) times daily. 07/15/23   [provider]  Multiple Vitamin (MULTIVITAMIN) tablet Take 1 tablet by mouth daily.    [provider]  tiZANidine  (ZANAFLEX ) 2 MG tablet Take 1 tablet (2 mg total) by mouth 2 (two) times daily as needed for muscle spasms. 11/26/23   Lawrance Presume, MD  topiramate  (TOPAMAX ) 25 MG tablet Take 1 tablet (25 mg total) by mouth 2 (two) times daily as needed. 11/23/23    Raulkar, Keven Pel, MD  XANAX  0.25 MG tablet Take 0.25 mg by mouth as needed. 07/15/23   [provider]      Allergies    Tape    Review of Systems   Review of Systems  Musculoskeletal:        Right ankle pain    Physical Exam Updated Vital Signs BP (!) 129/95   Pulse 86   Temp 98.1 F (36.7 C) (Oral)   Resp 17   SpO2 98%  Physical Exam Vitals and nursing note reviewed.  Constitutional:      Appearance: Normal appearance.  HENT:     Head: Atraumatic.  Cardiovascular:     Comments: Pedal pulses 2+ bilaterally Pulmonary:     Effort: Pulmonary effort is normal.  Musculoskeletal:     Comments: Right lower extremity: General Mild edema and contusions concerning the lateral aspect of the right ankle. No erythema or open wounds   Palpation Non tender over the knee Nontender along the tibia and fibula Nontender on the lateral and medial malleolus Tender along the ATFL, CFL, PTFL. Non-tender of the achilles tendon Nontender along the first through fifth metatarsals and phalanges  ROM Full ankle flexion, extension, inversion and eversion  Sensation: Sensation intact and at baseline throughout the lower extremity   Neurological:     General: No focal deficit present.  Mental Status: She is alert.  Psychiatric:        Mood and Affect: Mood normal.        Behavior: Behavior normal.     ED Results / Procedures / Treatments   Labs (all labs ordered are listed, but only abnormal results are displayed) Labs Reviewed - No data to display  EKG None  Radiology DG Ankle Complete Right Result Date: 12/20/2023 CLINICAL DATA:  Fall and trauma to the right ankle. EXAM: RIGHT ANKLE - COMPLETE 3+ VIEW COMPARISON:  None Available. FINDINGS: There is no evidence of fracture, dislocation, or joint effusion. There is no evidence of arthropathy or other focal bone abnormality. Soft tissues are unremarkable. IMPRESSION: Negative. Electronically Signed   By: Angus Bark M.D.   On: 12/20/2023 16:23    Procedures Procedures    Medications Ordered in ED Medications - No data to display  ED Course/ Medical Decision Making/ A&P                                 Medical Decision Making Amount and/or Complexity of Data Reviewed Radiology: ordered.     Differential diagnosis includes but is not limited to sprain, fracture, dislocation, compartment syndrome  ED Course:  Upon initial evaluation, patient is well-appearing, stable vitals.  Reporting pain to the lateral aspect of right ankle.  On exam, does have ecchymosis and edema of the lateral aspect of right ankle.  Tender to palpation particularly over the PTFL but does have tenderness over the ATFL and CFL.  No tenderness to palpation of the medial or lateral malleolus.  No tenderness diffusely of the right foot.  Full range of motion of the ankle.  Neurovascular intact in the right lower extremity.  X-ray without any acute abnormality.  Suspect ankle sprain.   Had a shared decision-making conversation with the patient regarding cam boot versus lace up ankle brace.  Since she has been able to walk without significant difficulty, she would prefer the lace up ankle brace which I feel is appropriate.  This was provided for patient.  She states she is already seeing physical therapy for a different problem, and is able to follow-up with them regarding her ankle sprain. Stable and appropriate for discharge home.   Imaging Studies ordered: I ordered imaging studies including Right ankle   I independently visualized the imaging with scope of interpretation limited to determining acute life threatening conditions related to emergency care. Imaging showed no acute abnormality I agree with the radiologist interpretation  Impression: Right ankle sprain  Disposition:  The patient was discharged home with instructions to wear lace up ankle brace provided while ambulating. May wean out of the brace as  tolerated. Follow up with orthopedics and her PT within the next week. Tylenol  and ibuprofen  as needed for pain at home.  Return precautions given.   This chart was dictated using voice recognition software, Dragon. Despite the best efforts of this provider to proofread and correct errors, errors may still occur which can change documentation meaning.          Final Clinical Impression(s) / ED Diagnoses Final diagnoses:  Moderate right ankle sprain, initial encounter    Rx / DC Orders ED Discharge Orders     None         Rexie Catena, PA-C 12/20/23 1729    Hershel Los, MD 12/20/23 2104

## 2023-12-20 NOTE — ED Triage Notes (Signed)
 Fell down stairs 3 days ago. Right ankle pain and swelling. HX fibromyalgia.

## 2023-12-21 ENCOUNTER — Ambulatory Visit: Admitting: Physical Therapy

## 2023-12-21 ENCOUNTER — Encounter: Payer: Self-pay | Admitting: Physical Medicine and Rehabilitation

## 2023-12-21 ENCOUNTER — Encounter: Attending: Physical Medicine and Rehabilitation | Admitting: Physical Medicine and Rehabilitation

## 2023-12-21 VITALS — BP 135/89 | HR 96 | Ht 65.0 in | Wt 184.0 lb

## 2023-12-21 DIAGNOSIS — M7918 Myalgia, other site: Secondary | ICD-10-CM | POA: Insufficient documentation

## 2023-12-21 DIAGNOSIS — M797 Fibromyalgia: Secondary | ICD-10-CM | POA: Diagnosis not present

## 2023-12-21 MED ORDER — LIDOCAINE HCL 1 % IJ SOLN
5.0000 mL | Freq: Once | INTRAMUSCULAR | Status: AC
  Administered 2023-12-21: 5 mL via INTRADERMAL

## 2023-12-21 NOTE — Progress Notes (Signed)
Trigger Point Injection  Indication: Cervical myofascial pain not relieved by medication management and other conservative care.  Informed consent was obtained after describing risk and benefits of the procedure with the patient, this includes bleeding, bruising, infection and medication side effects.  The patient wishes to proceed and has given written consent.  The patient was placed in a seated position.  The area of pain was marked and prepped with Betadine.  It was entered with a 25-gauge 1/2 inch needle and a total of 5 mL of 1% lidocaine and normal saline was injected into a total of 8 trigger points, after negative draw back for blood.  The patient tolerated the procedure well.  Post procedure instructions were given.  

## 2023-12-22 ENCOUNTER — Encounter: Payer: Self-pay | Admitting: Physical Medicine and Rehabilitation

## 2023-12-24 ENCOUNTER — Ambulatory Visit: Admitting: Physical Therapy

## 2023-12-24 DIAGNOSIS — M6281 Muscle weakness (generalized): Secondary | ICD-10-CM | POA: Diagnosis not present

## 2023-12-24 DIAGNOSIS — M797 Fibromyalgia: Secondary | ICD-10-CM | POA: Diagnosis not present

## 2023-12-24 DIAGNOSIS — R2681 Unsteadiness on feet: Secondary | ICD-10-CM | POA: Diagnosis not present

## 2023-12-24 NOTE — Therapy (Signed)
 OUTPATIENT PHYSICAL THERAPY LOWER EXTREMITY  TREATMENT    Patient Name: Laurie Casey MRN: [id number] DOB:20-Sep-1985, 38 y.o., female Today's Date: 12/24/2023  END OF SESSION:  PT End of Session - 12/24/23 0803     Visit Number 9    Number of Visits 10    Date for PT Re-Evaluation 12/31/23    Authorization Type [employer]    PT Start Time 0803    PT Stop Time 0845    PT Time Calculation (min) 42 min                 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   Pre-eclampsia    PTSD (post-traumatic stress disorder) 06/30/2022   Past Surgical History:  Procedure Laterality Date   CESAREAN SECTION  2013, 2015, 2019   x2   CESAREAN SECTION  03/24/2023   with partial historectomy with uterus, cervix and tube removal   CYSTOSCOPY  2008 or 2009   CYSTOSCOPY W/ URETERAL STENT PLACEMENT Right 11/29/2020   Procedure: CYSTOSCOPY WITH RETROGRADE PYELOGRAM/URETERAL STENT PLACEMENT;  Surgeon: Trent Frizzle, MD;  Location: [facility] OR;  Service: Urology;  Laterality: Right;   CYSTOSCOPY/URETEROSCOPY/HOLMIUM LASER/STENT PLACEMENT Right 12/13/2020   Procedure: CYSTOSCOPY RIGHT URETEROSCOPY/HOLMIUM LASER/STENT EXTRACTION AND RIGHT JJ STENT PLACEMENT, RIGHT RETROGRADE URETEROSCOPY;  Surgeon: Trent Frizzle, MD;  Location: [facility] ORS;  Service: Urology;  Laterality: Right;   URETER SURGERY     x2   WISDOM TOOTH EXTRACTION     Patient Active Problem List   Diagnosis Date Noted   Placenta accreta 12/03/2022   History of cesarean section 10/01/2022   Essential hypertension 08/11/2022   Obesity (BMI 30.0-34.9) 08/11/2022   High risk multigravida in third trimester 09/10/2017   Hx of preeclampsia,  prior pregnancy, currently pregnant 03/16/2014    PCP: Lawrance Presume, MD  REFERRING PROVIDER: Lawrance Presume, MD  REFERRING DIAG:  Diagnosis  M79.7 (ICD-10-CM) - Fibromyalgia    THERAPY DIAG:  Fibromyalgia  Muscle weakness (generalized)  Rationale for Evaluation and Treatment: Rehabilitation  ONSET DATE: chronic   SUBJECTIVE:   SUBJECTIVE STATEMENT:  Fell after last session and sprained ankle, I realize hips are very weak- sore after last session. Cerv inj but no relief PERTINENT HISTORY: See above  PAIN:  Are you having pain? Yes: NPRS scale: 6-7/10 Pain location: knees and hips  Pain description: hard to explain, dull ache plus some feeling like knees are swollen but they are not  Aggravating factors: overdoing it, stress, heat, dehydration, sometimes no pattern/correlation Relieving factors: relaxing, sitting on couch, heat, fluids   PRECAUTIONS: None  RED FLAGS: None   WEIGHT BEARING RESTRICTIONS: No  FALLS:  Has patient fallen in last 6 months? No  LIVING ENVIRONMENT: Lives with: lives with their partner, lives with their son, and lives with their daughter Lives in: House/apartment Stairs: 3 STE home  Has following equipment at home: None  OCCUPATION: unemployed   PLOF: Independent, Independent with basic ADLs, Independent with gait, and Independent with transfers  PATIENT GOALS: be more stable   NEXT MD VISIT:no appt scheduled   OBJECTIVE:  Note: Objective measures were completed at Evaluation unless otherwise noted.  PATIENT SURVEYS:  ABC scale 41.9%; 12/17/23- 35%  COGNITION: Overall cognitive status: Within functional limits for tasks assessed       POSTURE: rounded shoulders, forward head, increased thoracic kyphosis, and flexed trunk      LOWER EXTREMITY MMT:  MMT Right eval Left eval Right 12/17/23 Left 12/17/23  Hip flexion 3 3 5 5   Hip extension      Hip abduction 4 3 4 3   Hip adduction      Hip internal  rotation      Hip external rotation      Knee flexion 4 4 5 5   Knee extension 4 4 5 5   Ankle dorsiflexion 4 cogwheeling  4 cogwheeling  5 5  Ankle plantarflexion      Ankle inversion      Ankle eversion       (Blank rows = not tested)    FUNCTIONAL TESTS:  5 times sit to stand: 21.86 seconds intermittent use of UEs  Dynamic Gait Index: 14    10/12/23 0001  Standardized Balance Assessment  Standardized Balance Assessment Dynamic Gait Index  Dynamic Gait Index  Level Surface 2  Change in Gait Speed 2  Gait with Horizontal Head Turns 2  Gait with Vertical Head Turns 2  Gait and Pivot Turn 1  Step Over Obstacle 2  Step Around Obstacles 2  Steps 1  Total Score 14       GAIT: Distance walked: in clinic distances  Assistive device utilized: None Level of assistance: Complete Independence Comments: very stiff, antalgic, mildly unsteady                                                                                                                                 TREATMENT DATE:   12/24/23 UBE L 2 2 min fwd and backward Preformed and issued updated and adjusted HEP- see below Trunk flex and ext 15 x each  12/17/23  MMT and ABC score Nustep L5x8 minutes BLEs only  Education on progress on POC for last two visits, focus on strength and core, education that majority of goals have been met in scope of PT interventions    On blue air pad: TA sets 15x3 seconds, lateral trunk crunches x10 B Sidelying hip ABD + small circles forward/backward 2x5 B      11/27/23 Recheck goals and recert if needed Bike L3 x40mins  Seated row 20# 2x10  Lat pull down 20# 2x10  blackTB ext 2x10 blackTB flexion 2x10        PATIENT EDUCATION:  Education details: as above  Person educated: Patient Education method: Explanation and Verbal cues Education comprehension: verbalized understanding, returned demonstration, and needs further education  HOME EXERCISE PROGRAM:  Access  Code: [email or web address] URL: [email or web address] Date: 12/24/2023 Prepared by: Claud Gowan  Exercises - Shoulder extension with resistance - Neutral  - 1 x daily - 7 x weekly - 2 sets - 10 reps -  Standing Shoulder Row with Anchored Resistance  - 1 x daily - 7 x weekly - 2 sets - 10 reps - Shoulder External Rotation and Scapular Retraction with Resistance  - 1 x daily - 7 x weekly - 2 sets - 10 reps - Standing Shoulder Horizontal Abduction with Resistance  - 1 x daily - 7 x weekly - 2 sets - 10 reps - Hooklying Isometric Clamshell  - 1 x daily - 7 x weekly - 2 sets - 10 reps - 3 hold - Sidelying Hip Abduction  - 1 x daily - 7 x weekly - 3 sets - 5 reps - Supine Bridge  - 1 x daily - 7 x weekly - 2 sets - 10 reps - 3 hold - Supine Bridge with Mini Swiss Ball Between Knees  - 1 x daily - 7 x weekly - 2 sets - 10 reps - 3 hold - Small Range Straight Leg Raise  - 1 x daily - 7 x weekly - 1 sets - 10 reps  Access Code: [email or web address] URL: [email or web address] Date: 10/29/2023 Prepared by: Terrel Ferries  Exercises - Supine Bridge with Resistance Band  - 1 x daily - 4-5 x weekly - 1 sets - 10 reps - 1 seconds  hold - Clamshell with Resistance  - 1 x daily - 4-5 x weekly - 1 sets - 10 reps - 1 second  hold - Tandem Stance in Corner  - 1 x daily - 7 x weekly - 1 sets - 4 reps - 30 seconds  hold - Seated Scapular Retraction  - 1 x daily - 7 x weekly - 1 sets - 10 reps - 3 seconds  hold - Seated Thoracic Extension with Hands Behind Neck  - 1 x daily - 7 x weekly - 1 sets - 10 reps - 1 second  hold - Sit to Stand with Resistance Around Legs  - 1 x daily - 7 x weekly - 1 sets - 10 reps - Standing Hip Abduction with Resistance at Thighs  - 1 x daily - 7 x weekly - 1 sets - 10 reps - Tandem Walking with Counter Support  - 1 x daily - 7 x weekly - 1 sets - 5 reps - Backward Tandem Walking with Counter Support  - 1 x daily - 7 x weekly - 1 sets - 5 reps     ASSESSMENT:  CLINICAL  IMPRESSION:  Pt returns with a fall resulting in sprained ankle. Amb with SPC. Pt also has received cerv inj sice last session without relief. Focus session on strength and stab for HEP.   EVAL: Patient is a 39 y.o. F who was seen today for physical therapy evaluation and treatment for  Diagnosis  M79.7 (ICD-10-CM) - Fibromyalgia  . Of note a lot of her symptoms do seem to be highly related to stress and anxiety, she is getting skilled care in this realm. She has had a similar flare up in the past with a highly traumatic event. Objective measures as above. I think in terms of skilled PT services, water  therapy would definitely be in her best interest as pain flared very quickly even with basic measures taken on land today. She is agreeable to water  PT, will work to arrange this for her.   OBJECTIVE IMPAIRMENTS: Abnormal gait, decreased activity tolerance, decreased balance, decreased coordination, decreased knowledge of use of DME, decreased mobility, difficulty walking, decreased strength, and pain.   ACTIVITY LIMITATIONS: standing, squatting, stairs, transfers, locomotion  level, and caring for others  PARTICIPATION LIMITATIONS: driving, shopping, community activity, occupation, and yard work  PERSONAL FACTORS: Age, Behavior pattern, Education, Fitness, Past/current experiences, Sex, Social background, and Time since onset of injury/illness/exacerbation are also affecting patient's functional outcome.   REHAB POTENTIAL: Fair chronic fibro, highly irritable pain levels   CLINICAL DECISION MAKING: Evolving/moderate complexity  EVALUATION COMPLEXITY: Moderate   GOALS: Goals reviewed with patient? No  SHORT TERM GOALS: Target date: 11/02/2023   Will be compliant with appropriate progressive HEP  Baseline: Goal status: MET 10/29/23  2.  Will be able to ambulate at least 1082ft in community with LRAD, no unsteadiness and rest breaks PRN with normalized gait pattern  Baseline:  Goal  status: ONGOING 10/29/23 gait pattern and steadiness have improved, she has not tried longer distance walking on her own yet, MET 11/27/23 but depends on the day    LONG TERM GOALS: Target date: 01/08/2024    MMT to have improved by one grade all weak groups  Baseline:  Goal status: 4+/5 BLE MET 11/27/23  2.  Will be able to ambulate unlimited distances in community, no device, rest breaks PRN, with normalized gait pattern  Baseline:  Goal status: ONGOING 12/17/23  ongoing 12/24/23  3.  Will complete 5xSTS in 15 seconds or less no UEs to show improved mobility  Baseline:  Goal status: 11.38s MET 11/27/23  4.  Will score at least 20/24 on DGI to show improved functional balance  Baseline:  Goal status: 20/24 MET 11/27/23  5.  ABC score to improve by at least 20% to show improved confidence with mobility  Baseline: 42% Goal status: ONGOING 12/17/23  6. Patient will improve pain levels <3/10 on average days  Baseline: 5/6  Goal status: DEFERRED 12/17/23- goal not reasonable given that pain is driven by medical diagnosis rather than factors related to PT, defer to medical pain management here in context of fibromyalgia      PLAN:  PT FREQUENCY: 3 visits including today   PT DURATION: 3 visits   PLANNED INTERVENTIONS: 97110-Therapeutic exercises, 97530- Therapeutic activity, 97112- Neuromuscular re-education, 97535- Self Care, 16109- Manual therapy, and [id number]- Aquatic Therapy  PLAN FOR NEXT SESSION: strength and core work, assess HEP .DC 6/19     Jarry Manon PTA 12/24/23 8:04 AM

## 2023-12-28 ENCOUNTER — Ambulatory Visit

## 2023-12-30 DIAGNOSIS — F419 Anxiety disorder, unspecified: Secondary | ICD-10-CM | POA: Diagnosis not present

## 2023-12-30 DIAGNOSIS — F4310 Post-traumatic stress disorder, unspecified: Secondary | ICD-10-CM | POA: Diagnosis not present

## 2023-12-30 DIAGNOSIS — F32A Depression, unspecified: Secondary | ICD-10-CM | POA: Diagnosis not present

## 2023-12-31 ENCOUNTER — Ambulatory Visit: Admitting: Physical Therapy

## 2023-12-31 DIAGNOSIS — M797 Fibromyalgia: Secondary | ICD-10-CM

## 2023-12-31 DIAGNOSIS — M6281 Muscle weakness (generalized): Secondary | ICD-10-CM

## 2023-12-31 DIAGNOSIS — R2681 Unsteadiness on feet: Secondary | ICD-10-CM | POA: Diagnosis not present

## 2023-12-31 NOTE — Therapy (Signed)
 OUTPATIENT PHYSICAL THERAPY LOWER EXTREMITY  TREATMENT    Patient Name: Laurie Casey MRN: [id number] DOB:1986/05/29, 38 y.o., female Today's Date: 12/31/2023  END OF SESSION:  PT End of Session - 12/31/23 0804     Visit Number 10    Date for PT Re-Evaluation 12/31/23    Authorization Type [employer]    Authorization Time Period 10/12/23 to 11/23/23; extended 12/17/23 to 12/31/23    PT Start Time 0805    PT Stop Time 0845    PT Time Calculation (min) 40 min                 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   Pre-eclampsia    PTSD (post-traumatic stress disorder) 06/30/2022   Past Surgical History:  Procedure Laterality Date   CESAREAN SECTION  2013, 2015, 2019   x2   CESAREAN SECTION  03/24/2023   with partial historectomy with uterus, cervix and tube removal   CYSTOSCOPY  2008 or 2009   CYSTOSCOPY W/ URETERAL STENT PLACEMENT Right 11/29/2020   Procedure: CYSTOSCOPY WITH RETROGRADE PYELOGRAM/URETERAL STENT PLACEMENT;  Surgeon: Trent Frizzle, MD;  Location: [facility] OR;  Service: Urology;  Laterality: Right;   CYSTOSCOPY/URETEROSCOPY/HOLMIUM LASER/STENT PLACEMENT Right 12/13/2020   Procedure: CYSTOSCOPY RIGHT URETEROSCOPY/HOLMIUM LASER/STENT EXTRACTION AND RIGHT JJ STENT PLACEMENT, RIGHT RETROGRADE URETEROSCOPY;  Surgeon: Trent Frizzle, MD;  Location: [facility];  Service: Urology;  Laterality: Right;   URETER SURGERY     x2   WISDOM TOOTH EXTRACTION     Patient Active Problem List   Diagnosis Date Noted   Placenta accreta 12/03/2022   History of cesarean section 10/01/2022   Essential hypertension 08/11/2022   Obesity (BMI 30.0-34.9) 08/11/2022   High risk multigravida  in third trimester 09/10/2017   Hx of preeclampsia, prior pregnancy, currently pregnant 03/16/2014    PCP: Lawrance Presume, MD  REFERRING PROVIDER: Lawrance Presume, MD  REFERRING DIAG:  Diagnosis  M79.7 (ICD-10-CM) - Fibromyalgia    THERAPY DIAG:  Fibromyalgia  Muscle weakness (generalized)  Rationale for Evaluation and Treatment: Rehabilitation  ONSET DATE: chronic   SUBJECTIVE:   SUBJECTIVE STATEMENT:HEP is really good. No falls   PERTINENT HISTORY: See above  PAIN:  Are you having pain? Yes: NPRS scale: 6-7/10 Pain location: knees and hips  Pain description: hard to explain, dull ache plus some feeling like knees are swollen but they are not  Aggravating factors: overdoing it, stress, heat, dehydration, sometimes no pattern/correlation Relieving factors: relaxing, sitting on couch, heat, fluids   PRECAUTIONS: None  RED FLAGS: None   WEIGHT BEARING RESTRICTIONS: No  FALLS:  Has patient fallen in last 6 months? No  LIVING ENVIRONMENT: Lives with: lives with their partner, lives with their son, and lives with their daughter Lives in: House/apartment Stairs: 3 STE home  Has following equipment at home: None  OCCUPATION: unemployed   PLOF: Independent, Independent with basic ADLs, Independent with gait, and Independent with transfers  PATIENT GOALS: be more stable   NEXT MD VISIT:no appt scheduled   OBJECTIVE:  Note: Objective measures were completed at Evaluation unless otherwise noted.    PATIENT SURVEYS:  ABC scale 41.9%; 12/17/23- 35%  COGNITION: Overall cognitive status: Within functional limits for tasks assessed       POSTURE: rounded shoulders, forward head, increased thoracic kyphosis, and flexed trunk      LOWER EXTREMITY MMT:  MMT Right eval Left eval Right 12/17/23 Left 12/17/23  Hip flexion 3 3 5 5   Hip extension      Hip abduction 4 3 4 3   Hip adduction      Hip internal rotation      Hip external rotation       Knee flexion 4 4 5 5   Knee extension 4 4 5 5   Ankle dorsiflexion 4 cogwheeling  4 cogwheeling  5 5  Ankle plantarflexion      Ankle inversion      Ankle eversion       (Blank rows = not tested)    FUNCTIONAL TESTS:  5 times sit to stand: 21.86 seconds intermittent use of UEs  Dynamic Gait Index: 14    10/12/23 0001  Standardized Balance Assessment  Standardized Balance Assessment Dynamic Gait Index  Dynamic Gait Index  Level Surface 2  Change in Gait Speed 2  Gait with Horizontal Head Turns 2  Gait with Vertical Head Turns 2  Gait and Pivot Turn 1  Step Over Obstacle 2  Step Around Obstacles 2  Steps 1  Total Score 14       GAIT: Distance walked: in clinic distances  Assistive device utilized: None Level of assistance: Complete Independence Comments: very stiff, antalgic, mildly unsteady                                                                                                                                 TREATMENT DATE:   12/31/23 UBE L 3 5 min STS 4# chest press 10 x STS 4# OH press 10 x Side stepping green tband 10 feet 6 x 10# cable pulley rotation and AR press 10 x each side Trunk flex and ext black tband 20 x each Core stab x 10 min    12/24/23 UBE L 2 2 min fwd and backward Preformed and issued updated and adjusted HEP- see below Trunk flex and ext 15 x each  12/17/23  MMT and ABC score Nustep L5x8 minutes BLEs only  Education on progress on POC for last two visits, focus on strength and core, education that majority of goals have been met in scope of PT interventions    On blue air pad: TA sets 15x3 seconds, lateral trunk crunches x10 B Sidelying hip ABD + small circles forward/backward 2x5 B      11/27/23 Recheck goals and recert if needed Bike L3 x5mins  Seated row 20# 2x10  Lat pull down 20# 2x10  blackTB ext 2x10 blackTB flexion 2x10        PATIENT EDUCATION:  Education details: as above  Person educated:  Patient Education method: Explanation and Verbal cues Education comprehension: verbalized understanding, returned demonstration,  and needs further education  HOME EXERCISE PROGRAM:  Access Code: [email or web address] URL: [email or web address] Date: 12/24/2023 Prepared by: Geraldean Walen  Exercises - Shoulder extension with resistance - Neutral  - 1 x daily - 7 x weekly - 2 sets - 10 reps - Standing Shoulder Row with Anchored Resistance  - 1 x daily - 7 x weekly - 2 sets - 10 reps - Shoulder External Rotation and Scapular Retraction with Resistance  - 1 x daily - 7 x weekly - 2 sets - 10 reps - Standing Shoulder Horizontal Abduction with Resistance  - 1 x daily - 7 x weekly - 2 sets - 10 reps - Hooklying Isometric Clamshell  - 1 x daily - 7 x weekly - 2 sets - 10 reps - 3 hold - Sidelying Hip Abduction  - 1 x daily - 7 x weekly - 3 sets - 5 reps - Supine Bridge  - 1 x daily - 7 x weekly - 2 sets - 10 reps - 3 hold - Supine Bridge with Mini Swiss Ball Between Knees  - 1 x daily - 7 x weekly - 2 sets - 10 reps - 3 hold - Small Range Straight Leg Raise  - 1 x daily - 7 x weekly - 1 sets - 10 reps  Access Code: [email or web address] URL: [email or web address] Date: 10/29/2023 Prepared by: Terrel Ferries  Exercises - Supine Bridge with Resistance Band  - 1 x daily - 4-5 x weekly - 1 sets - 10 reps - 1 seconds  hold - Clamshell with Resistance  - 1 x daily - 4-5 x weekly - 1 sets - 10 reps - 1 second  hold - Tandem Stance in Corner  - 1 x daily - 7 x weekly - 1 sets - 4 reps - 30 seconds  hold - Seated Scapular Retraction  - 1 x daily - 7 x weekly - 1 sets - 10 reps - 3 seconds  hold - Seated Thoracic Extension with Hands Behind Neck  - 1 x daily - 7 x weekly - 1 sets - 10 reps - 1 second  hold - Sit to Stand with Resistance Around Legs  - 1 x daily - 7 x weekly - 1 sets - 10 reps - Standing Hip Abduction with Resistance at Thighs  - 1 x daily - 7 x weekly - 1 sets - 10 reps - Tandem  Walking with Counter Support  - 1 x daily - 7 x weekly - 1 sets - 5 reps - Backward Tandem Walking with Counter Support  - 1 x daily - 7 x weekly - 1 sets - 5 reps     ASSESSMENT:  CLINICAL IMPRESSION:  Pt arrives stating doing HEP and doing fairly well. Pt feels PT has been very helpful. Educ on staying on good schedule with HEP and if further issues arise we can revisit with new MD script. Focus on general strengthening today.  EVAL: Patient is a 38 y.o. F who was seen today for physical therapy evaluation and treatment for  Diagnosis  M79.7 (ICD-10-CM) - Fibromyalgia  . Of note a lot of her symptoms do seem to be highly related to stress and anxiety, she is getting skilled care in this realm. She has had a similar flare up in the past with a highly traumatic event. Objective measures as above. I think in terms of skilled PT services, water  therapy would definitely be in her best interest as pain flared very quickly even with basic  measures taken on land today. She is agreeable to water  PT, will work to arrange this for her.   OBJECTIVE IMPAIRMENTS: Abnormal gait, decreased activity tolerance, decreased balance, decreased coordination, decreased knowledge of use of DME, decreased mobility, difficulty walking, decreased strength, and pain.   ACTIVITY LIMITATIONS: standing, squatting, stairs, transfers, locomotion level, and caring for others  PARTICIPATION LIMITATIONS: driving, shopping, community activity, occupation, and yard work  PERSONAL FACTORS: Age, Behavior pattern, Education, Fitness, Past/current experiences, Sex, Social background, and Time since onset of injury/illness/exacerbation are also affecting patient's functional outcome.   REHAB POTENTIAL: Fair chronic fibro, highly irritable pain levels   CLINICAL DECISION MAKING: Evolving/moderate complexity  EVALUATION COMPLEXITY: Moderate   GOALS: Goals reviewed with patient? No  SHORT TERM GOALS: Target date: 11/02/2023    Will be compliant with appropriate progressive HEP  Baseline: Goal status: MET 10/29/23  2.  Will be able to ambulate at least 1064ft in community with LRAD, no unsteadiness and rest breaks PRN with normalized gait pattern  Baseline:  Goal status: ONGOING 10/29/23 gait pattern and steadiness have improved, she has not tried longer distance walking on her own yet, MET 11/27/23 but depends on the day. Progressing and varies 12/31/23    LONG TERM GOALS: Target date: 01/08/2024    MMT to have improved by one grade all weak groups  Baseline:  Goal status: 4+/5 BLE MET 11/27/23  2.  Will be able to ambulate unlimited distances in community, no device, rest breaks PRN, with normalized gait pattern  Baseline:  Goal status: ONGOING 12/17/23  ongoing 12/24/23  Progressing 12/31/23  3.  Will complete 5xSTS in 15 seconds or less no UEs to show improved mobility  Baseline:  Goal status: 11.38s MET 11/27/23  4.  Will score at least 20/24 on DGI to show improved functional balance  Baseline:  Goal status: 20/24 MET 11/27/23  5.  ABC score to improve by at least 20% to show improved confidence with mobility  Baseline: 42% Goal status: ONGOING 12/17/23  progressing 12/31/23  6. Patient will improve pain levels <3/10 on average days  Baseline: 5/6  Goal status: DEFERRED 12/17/23- goal not reasonable given that pain is driven by medical diagnosis rather than factors related to PT, defer to medical pain management here in context of fibromyalgia      PLAN:  PT FREQUENCY: 3 visits including today   PT DURATION: 3 visits   PLANNED INTERVENTIONS: 97110-Therapeutic exercises, 97530- Therapeutic activity, 97112- Neuromuscular re-education, 97535- Self Care, [id number]- Manual therapy, and [id number]- Aquatic Therapy  PLAN FOR NEXT SESSION: will D/C today and have pt continue with HEP. Encouraged pt to state on good routine and if further needs arise in future she can come back with new script form MD  PHYSICAL  THERAPY DISCHARGE SUMMARY  Patient agrees to discharge. Patient goals were partially met. Patient is being discharged due to maximized rehab potential.       Isabella Mao PTA 12/31/23 8:35 AM

## 2024-01-05 ENCOUNTER — Encounter (HOSPITAL_BASED_OUTPATIENT_CLINIC_OR_DEPARTMENT_OTHER): Admitting: Physical Medicine and Rehabilitation

## 2024-01-05 DIAGNOSIS — M797 Fibromyalgia: Secondary | ICD-10-CM | POA: Diagnosis not present

## 2024-01-05 MED ORDER — TOPIRAMATE 25 MG PO TABS: 25.0000 mg | ORAL_TABLET | Freq: Three times a day (TID) | ORAL | 3 refills | Status: DC | PRN

## 2024-01-06 NOTE — Progress Notes (Signed)
 Subjective:    Patient ID: Laurie Casey, female    DOB: 15-Nov-1985, 38 y.o.   MRN: [id number]  HPI An audio/video tele-health visit is felt to be the most appropriate encounter for this patient at this time. This is a follow up tele-visit via phone. The patient is at home. MD is at office. Prior to scheduling this appointment, our staff discussed the limitations of evaluation and management by telemedicine and the availability of in-person appointments. The patient expressed understanding and agreed to proceed.   1) Fibromyalgia: -recently her current partner has been causing her a lot of stress as he has been unkind to her -she feels financially dependent on him since their car and home is in his name -she asks about ketamine Her pain has been severe -she is interested in increasing her topamax  dose -has 4 kids- first when she was 76 -has had fibro for 7 years -she was in a very toxic marriage with someone who was 100 percent disabled from the [employer] -she left her marriage with her kids in the middle of the night -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 -she started taking tart cherry juice at night  4) Numbness and tingling: -started in 2001 when she lost function of her arm -this does bother her  Pain Inventory Average Pain 7 Pain Right Now 7 My pain is intermittent, sharp, burning, dull, stabbing, tingling, and aching  In the last 24 hours, has pain interfered with the following? General activity 8 Relation with others 1 Enjoyment of life 10 What TIME of day is your pain at its worst? morning , daytime, evening, and night Sleep (in general) Fair  Pain is worse with: walking, bending, sitting, inactivity, standing, unsure, and some activites Pain  improves with: rest, heat/ice, and TENS Relief from Meds: 2  walk without assistance use a cane how many minutes can you walk? Less than 30 minutes ability to climb steps?  yes do you drive?  yes Do you have any goals in this area?  yes  not employed: date last employed 09/29/2023 I need assistance with the following:  household duties and shopping Do you have any goals in this area?  yes  bladder control problems weakness numbness tremor tingling trouble walking spasms dizziness confusion depression anxiety  Any changes since last visit?  no  Any changes since last visit?  no    Family History  Problem Relation Age of Onset   Hypertension Father    Social History   Socioeconomic History   Marital status: Significant Other    Spouse name: Not on file   Number of children: 3   Years of education: Not on file   Highest education level: Not on file  Occupational History   Occupation: Vet tech  Tobacco Use   Smoking status: Former    Current packs/day: 0.00    Types: Cigarettes    Quit date: 10/12/2020    Years since quitting: 3.2   Smokeless tobacco: Never   Tobacco comments:    off and on   Vaping Use   Vaping status: Never Used  Substance and Sexual Activity   Alcohol use: Not Currently   Drug use: Never   Sexual activity: Not on  file  Other Topics Concern   Not on file  Social History Narrative   Not on file   Social Drivers of Health   Financial Resource Strain: Low Risk  (07/23/2023)   Overall Financial Resource Strain (CARDIA)    Difficulty of Paying Living Expenses: Not hard at all  Food Insecurity: No Food Insecurity (07/23/2023)   Hunger Vital Sign    Worried About Running Out of Food in the Last Year: Never true    Ran Out of Food in the Last Year: Never true  Transportation Needs: No Transportation Needs (07/23/2023)   PRAPARE - Administrator, Civil Service (Medical): No    Lack of Transportation (Non-Medical): No  Physical  Activity: Sufficiently Active (07/23/2023)   Exercise Vital Sign    Days of Exercise per Week: 5 days    Minutes of Exercise per Session: 40 min  Stress: Stress Concern Present (07/23/2023)   [employer] of Occupational Health - Occupational Stress Questionnaire    Feeling of Stress : Very much  Social Connections: Socially Isolated (07/23/2023)   Social Connection and Isolation Panel    Frequency of Communication with Friends and Family: Never    Frequency of Social Gatherings with Friends and Family: Never    Attends Religious Services: Never    Database administrator or Organizations: No    Attends Banker Meetings: Never    Marital Status: Living with partner   Past Surgical History:  Procedure Laterality Date   CESAREAN SECTION  2013, 2015, 2019   x2   CESAREAN SECTION  03/24/2023   with partial historectomy with uterus, cervix and tube removal   CYSTOSCOPY  2008 or 2009   CYSTOSCOPY W/ URETERAL STENT PLACEMENT Right 11/29/2020   Procedure: CYSTOSCOPY WITH RETROGRADE PYELOGRAM/URETERAL STENT PLACEMENT;  Surgeon: Matilda Senior, MD;  Location: [facility];  Service: Urology;  Laterality: Right;   CYSTOSCOPY/URETEROSCOPY/HOLMIUM LASER/STENT PLACEMENT Right 12/13/2020   Procedure: CYSTOSCOPY RIGHT URETEROSCOPY/HOLMIUM LASER/STENT EXTRACTION AND RIGHT JJ STENT PLACEMENT, RIGHT RETROGRADE URETEROSCOPY;  Surgeon: Matilda Senior, MD;  Location: [facility];  Service: Urology;  Laterality: Right;   URETER SURGERY     x2   WISDOM TOOTH EXTRACTION     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  Opioid Risk Score:   Fall Risk Score:  `1  Depression screen PHQ 2/9     12/21/2023    2:08 PM 11/13/2023   11:10 AM 07/23/2023    1:32 PM 05/22/2023   12:01 PM 05/22/2023   11:47 AM 10/01/2022    8:50 AM 08/11/2022    3:15 PM  Depression screen PHQ 2/9  Decreased Interest 1 1 1 1 1 1 2   Down, Depressed, Hopeless 1 1 1 1 1  0 1  PHQ - 2 Score 2 2 2 2 2 1 3   Altered sleeping  1 0   0 0  Tired, decreased energy  1 2 0 0 2 2  Change in appetite  3 0 1 1 1  0  Feeling bad or failure  about yourself   0 0 0 0 0 0  Trouble concentrating  1 1 1 1  0 0  Moving slowly or fidgety/restless  1 0 0 0 0 0  Suicidal thoughts  0 0 3 0 0 0  PHQ-9 Score  9 5   4 5   Difficult doing work/chores   Somewhat difficult        Review of Systems  Cardiovascular:  Positive for leg swelling.  Gastrointestinal:  Positive for constipation and diarrhea.  Genitourinary:  Positive for frequency.  Musculoskeletal:        Trouble walking- off balance, pain in knees, hips, ankles  Neurological:  Positive for tremors, weakness and numbness.       Tingling  Psychiatric/Behavioral:  Positive for confusion.        Depression, anxiety  All other systems reviewed and are negative.      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 -discussed that she found a new partner that is supportive and his family is supportive -continue gabapenitn and cymbalta -discussed that pain has improved since stopping sugar and caffeine -continue PT, discussed that she is working on core strengthening, discussed aquatherapy  -discussed her current stressors, discussed her  worsening pain, discussed ketamine and that our medical director has asked that we not prescribe this therapy, discussed that I could provide her a referral to other practices for it but she defers at this time, discussed increasing topamax  and she would like to try this, advised increasing night time dose but maintaining current daytime dose given her daytime fatigue, discussed EMDR therapy for her history of trauma, recommended Rocky Nadean Schultze, recommended couples counseling if her partner is willing to participate, discussed the importance of having a supportive partner in recovering from fibromyalgia  -discussed that she swam competitively as a child  Discussed extracorporeal shockwave therapy as a modality for treatment. Discussed that the device looks and feels like a massage gun and I would move it over the area of pain for about 10 minutes. The device releases sound waves to the area of pain and helps to improve blood flow and circulation to improve the healing process. Discuss that this initially induces inflammation and can sometimes cause short-term increase in pain. Discussed that we typically do three weekly treatments, but sometimes up to 6 if needed, and after 6 weeks long term benefits can sometimes be achieved. Discussed that this is an FDA approved device, but not covered by insurance and would cost $60 per session. Will scheduled patient for 6 consecutive appointments and can cancel latter three if benefits are achieved after first three sessions.    -discussed red light therapy  -discussed that she sees a psychiatrist and psychologist in [facility] -Discussed current symptoms of pain and history of pain.  -Discussed benefits of exercise in reducing pain. -Discussed following foods that may reduce pain: 1) Ginger (especially studied for arthritis)- reduce leukotriene production to decrease inflammation 2) Blueberries- high in phytonutrients that decrease  inflammation 3) Salmon- marine omega-3s reduce joint swelling and pain 4) Pumpkin seeds- reduce inflammation 5) dark chocolate- reduces inflammation 6) turmeric- reduces inflammation 7) tart cherries -  reduce pain and stiffness 8) extra virgin olive oil - its compound olecanthal helps to block prostaglandins  9) chili peppers- can be eaten or applied topically via capsaicin 10) mint- helpful for headache, muscle aches, joint pain, and itching 11) garlic- reduces inflammation 12) Green tea- reduces inflammation and oxidative stress, helps with weight loss, may reduce the risk of cancer, recommend Double [employer] of Tea daily  2) Insomnia: -continue tart cherry juice at night -encouraged napping during the day if her son is napping -discussed that sleep has been better -discussed that this is her related to her PTSD -continue Seroquel -discussed that she wakes up if her pain Link to further information on diet for chronic pain: http://www.bray.com/  -Try to go outside near sunrise -Get exercise during the day.  -Turn off all devices an hour before bedtime.  -Teas that can benefit: chamomile, valerian root, Brahmi (Bacopa) -Can consider over the counter melatonin, magnesium, and/or L-theanine. Melatonin is an anti-oxidant with multiple health benefits. Magnesium is involved in greater than 300 enzymatic reactions in the body and most of us  are deficient as our soil is often depleted. There are 7 different types of magnesium- Bioptemizer's is a supplement with all 7 types, and each has unique benefits. Magnesium can also help with constipation and anxiety.  -Pistachios naturally increase the production of melatonin -Cozy Earth bamboo bed sheets are free from toxic chemicals.  -Tart cherry juice or a tart cherry supplement can improve sleep and soreness post-workout  3) Placenta previa:  -discussed that her  tubes, uterus, and cervix were removed  4) PTSD: continue Seroquel, can consider replacing with topamax , discuss with psychology and psychiatry  5) Anxiety/depression: continue gabapentin  and cymbalta  6) Allergies: pepcid  prescribed  7) Sciatica: -discussed that this has been rough the last couple of days -discussed that it is present on both sides  -discussed PT  8) Numbness in back: -discussed that it also feels like bugs are crawling on her -Discussed Qutenza as an option for neuropathic pain control. Discussed that this is a capsaicin patch, stronger than capsaicin cream. Discussed that it is currently approved for diabetic peripheral neuropathy and post-herpetic neuralgia, but that it has also shown benefit in treating other forms of neuropathy. Provided patient with link to site to learn more about the patch: https://www.clark.biz/. Discussed that the patch would be placed in office and benefits usually last 3 months. Discussed that unintended exposure to capsaicin can cause severe irritation of eyes, mucous membranes, respiratory tract, and skin, but that Qutenza is a local treatment and does not have the systemic side effects of other nerve medications. Discussed that there may be pain, itching, erythema, and decreased sensory function associated with the application of Qutenza. Side effects usually subside within 1 week. A cold pack of analgesic medications can help with these side effects. Blood pressure can also be increased due to pain associated with administration of the patch.   9) Brain fog: -discussed weaning off gabapentin  -discussed that she could try just taking gabapentin  at night  10) Overweight: -discussed that she feels really good with whole foods plant based diet  -discussed that she has lost 7 lbs!   20 minutes spent in the discussion of her current stressors, discussed her worsening pain, discussed ketamine and that our medical director has asked that we not  prescribe this therapy, discussed that I could provide her a referral to other practices for it but she defers at this time, discussed increasing topamax  and  she would like to try this, advised increasing night time dose but maintaining current daytime dose given her daytime fatigue, discussed EMDR therapy for her history of trauma, recommended Rocky Nadean Schultze, recommended couples counseling if her partner is willing to participate, discussed the importance of having a supportive partner in recovering from fibromyalgia

## 2024-01-07 ENCOUNTER — Ambulatory Visit: Admitting: Allergy

## 2024-01-13 DIAGNOSIS — F411 Generalized anxiety disorder: Secondary | ICD-10-CM | POA: Diagnosis not present

## 2024-01-14 ENCOUNTER — Encounter: Attending: Physical Medicine and Rehabilitation | Admitting: Physical Medicine and Rehabilitation

## 2024-01-14 DIAGNOSIS — M797 Fibromyalgia: Secondary | ICD-10-CM | POA: Insufficient documentation

## 2024-01-14 DIAGNOSIS — E663 Overweight: Secondary | ICD-10-CM | POA: Insufficient documentation

## 2024-01-14 MED ORDER — PREGABALIN 50 MG PO CAPS: 50.0000 mg | ORAL_CAPSULE | Freq: Every evening | ORAL | 3 refills | Status: DC

## 2024-01-14 MED ORDER — PRAZOSIN HCL 1 MG PO CAPS: 1.0000 mg | ORAL_CAPSULE | Freq: Every day | ORAL | 3 refills | Status: DC

## 2024-01-14 NOTE — Progress Notes (Signed)
 Subjective:    Patient ID: Laurie Casey, female    DOB: [birth year], 38 y.o.   MRN: [id number]  HPI An audio/video tele-health visit is felt to be the most appropriate encounter for this patient at this time. This is a follow up tele-visit via phone. The patient is at home. MD is at office. Prior to scheduling this appointment, our staff discussed the limitations of evaluation and management by telemedicine and the availability of in-person appointments. The patient expressed understanding and agreed to proceed.   1) Fibromyalgia: -the EMDR therapy was not covered by her insurance -she has been struggling with insomnia, depression, anxiety, PTSD -her psychologist is switching practices and she plans to follow her -she plans to get a new psychiatrist at the new practice too -she has been having a lot of pain recently -recently her current partner has been causing her a lot of stress as he has been unkind to her -she feels financially dependent on him since their car and home is in his name -she asks about ketamine Her pain has been severe -she is interested in increasing her topamax  dose -has 4 kids- first when she was 36 -has had fibro for 7 years -she was in a very toxic marriage with someone who was 100 percent disabled from the [employer] -she left her marriage with her kids in the middle of the night -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 -she started taking tart cherry juice at night  4) Numbness and tingling: -started in 2001 when she lost function of her arm -this does bother her  Pain Inventory Average Pain 7 Pain Right Now 7 My pain is intermittent, sharp, burning, dull, stabbing, tingling, and aching  In the last 24 hours, has pain interfered with the  following? General activity 8 Relation with others 1 Enjoyment of life 10 What TIME of day is your pain at its worst? morning , daytime, evening, and night Sleep (in general) Fair  Pain is worse with: walking, bending, sitting, inactivity, standing, unsure, and some activites Pain improves with: rest, heat/ice, and TENS Relief from Meds: 2  walk without assistance use a cane how many minutes can you walk? Less than 30 minutes ability to climb steps?  yes do you drive?  yes Do you have any goals in this area?  yes  not employed: date last employed 09/29/2023 I need assistance with the following:  household duties and shopping Do you have any goals in this area?  yes  bladder control problems weakness numbness tremor tingling trouble walking spasms dizziness confusion depression anxiety  Any changes since last visit?  no  Any changes since last visit?  no    Family History  Problem Relation Age of Onset   Hypertension Father    Social History   Socioeconomic History   Marital status: Significant Other    Spouse name: Not on file   Number of children: 3   Years of education: Not on file   Highest education level: Not on file  Occupational History   Occupation: Vet tech  Tobacco Use   Smoking status: Former    Current packs/day: 0.00    Types: Cigarettes    Quit date: 10/12/2020    Years  since quitting: 3.2   Smokeless tobacco: Never   Tobacco comments:    off and on   Vaping Use   Vaping status: Never Used  Substance and Sexual Activity   Alcohol use: Not Currently   Drug use: Never   Sexual activity: Not on file  Other Topics Concern   Not on file  Social History Narrative   Not on file   Social Drivers of Health   Financial Resource Strain: Low Risk  (07/23/2023)   Overall Financial Resource Strain (CARDIA)    Difficulty of Paying Living Expenses: Not hard at all  Food Insecurity: No Food Insecurity (07/23/2023)   Hunger Vital Sign    Worried  About Running Out of Food in the Last Year: Never true    Ran Out of Food in the Last Year: Never true  Transportation Needs: No Transportation Needs (07/23/2023)   PRAPARE - Administrator, Civil Service (Medical): No    Lack of Transportation (Non-Medical): No  Physical Activity: Sufficiently Active (07/23/2023)   Exercise Vital Sign    Days of Exercise per Week: 5 days    Minutes of Exercise per Session: 40 min  Stress: Stress Concern Present (07/23/2023)   [employer] of Occupational Health - Occupational Stress Questionnaire    Feeling of Stress : Very much  Social Connections: Socially Isolated (07/23/2023)   Social Connection and Isolation Panel    Frequency of Communication with Friends and Family: Never    Frequency of Social Gatherings with Friends and Family: Never    Attends Religious Services: Never    Database administrator or Organizations: No    Attends Banker Meetings: Never    Marital Status: Living with partner   Past Surgical History:  Procedure Laterality Date   CESAREAN SECTION  2013, 2015, 2019   x2   CESAREAN SECTION  03/24/2023   with partial historectomy with uterus, cervix and tube removal   CYSTOSCOPY  2008 or 2009   CYSTOSCOPY W/ URETERAL STENT PLACEMENT Right 11/29/2020   Procedure: CYSTOSCOPY WITH RETROGRADE PYELOGRAM/URETERAL STENT PLACEMENT;  Surgeon: Matilda Senior, MD;  Location: [facility];  Service: Urology;  Laterality: Right;   CYSTOSCOPY/URETEROSCOPY/HOLMIUM LASER/STENT PLACEMENT Right 12/13/2020   Procedure: CYSTOSCOPY RIGHT URETEROSCOPY/HOLMIUM LASER/STENT EXTRACTION AND RIGHT JJ STENT PLACEMENT, RIGHT RETROGRADE URETEROSCOPY;  Surgeon: Matilda Senior, MD;  Location: [facility];  Service: Urology;  Laterality: Right;   URETER SURGERY     x2   WISDOM TOOTH EXTRACTION     Past Medical History:  Diagnosis Date   Anxiety    Asthma    with pregnancy   Calculus of ureter 11/29/2020   Eczema 06/30/2022   Fibromyalgia  08/11/2022   GAD (generalized anxiety disorder) 06/30/2022   History of kidney stones    History of migraine    Major depressive disorder, single episode, 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  Opioid Risk Score:   Fall Risk Score:  `1  Depression screen PHQ 2/9     12/21/2023    2:08 PM 11/13/2023   11:10 AM 07/23/2023    1:32 PM 05/22/2023   12:01 PM 05/22/2023   11:47 AM 10/01/2022    8:50 AM 08/11/2022    3:15 PM  Depression screen PHQ 2/9  Decreased Interest 1 1 1 1 1 1 2   Down, Depressed, Hopeless 1 1 1 1 1  0  1  PHQ - 2 Score 2 2 2 2 2 1 3   Altered sleeping  1 0   0 0  Tired, decreased energy  1 2 0 0 2 2  Change in appetite  3 0 1 1 1  0  Feeling bad or failure about yourself   0 0 0 0 0 0  Trouble concentrating  1 1 1 1  0 0  Moving slowly or fidgety/restless  1 0 0 0 0 0  Suicidal thoughts  0 0 3 0 0 0  PHQ-9 Score  9 5   4 5   Difficult doing work/chores   Somewhat difficult        Review of Systems  Cardiovascular:  Positive for leg swelling.  Gastrointestinal:  Positive for constipation and diarrhea.  Genitourinary:  Positive for frequency.  Musculoskeletal:        Trouble walking- off balance, pain in knees, hips, ankles  Neurological:  Positive for tremors, weakness and numbness.       Tingling  Psychiatric/Behavioral:  Positive for confusion.        Depression, anxiety  All other systems reviewed and are negative.      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  -discussed her pain, depression, anxiety, PTSD,  discussed that she has never tried prazosin and would like to, prescribed 1mg  HS, discussed that she would like to try lyrica instead of gabapentin , prescribed 50mg  lyrica to replace her 600mg  gabapentin   -discussed that she found a new partner that is supportive and his family is supportive -continue gabapenitn and cymbalta -discussed that pain has improved since stopping sugar and caffeine -continue PT, discussed that she is working on core strengthening, discussed aquatherapy  -discussed her current stressors, discussed her worsening pain, discussed ketamine and that our medical director has asked that we not prescribe this therapy, discussed that I could provide her a referral to other practices for it but she defers at this time, discussed increasing topamax  and she would like to try this, advised increasing night time dose but maintaining current daytime dose given her daytime fatigue, discussed EMDR therapy for her history of trauma, recommended [facility] Schultze, recommended couples counseling if her partner is willing to participate, discussed the importance of having a supportive partner in recovering from fibromyalgia  -discussed that she swam competitively as a child  Discussed extracorporeal shockwave therapy as a modality for treatment. Discussed that the device looks and feels like a massage gun and I would move it over the area of pain for about 10 minutes. The device releases sound waves to the area of pain and helps to improve blood flow and circulation to improve the healing process. Discuss that this initially induces inflammation and can sometimes cause short-term increase in pain. Discussed that we typically do three weekly treatments, but sometimes up to 6 if needed, and after 6 weeks long term benefits can sometimes be achieved. Discussed that this is an FDA approved device, but not covered by insurance and would cost $60 per session. Will scheduled patient for 6 consecutive  appointments and can cancel latter three if benefits are achieved after first three sessions.    -  discussed red light therapy  -discussed that she sees a psychiatrist and psychologist in [facility] -Discussed current symptoms of pain and history of pain.  -Discussed benefits of exercise in reducing pain. -Discussed following foods that may reduce pain: 1) Ginger (especially studied for arthritis)- reduce leukotriene production to decrease inflammation 2) Blueberries- high in phytonutrients that decrease inflammation 3) Salmon- marine omega-3s reduce joint swelling and pain 4) Pumpkin seeds- reduce inflammation 5) dark chocolate- reduces inflammation 6) turmeric- reduces inflammation 7) tart cherries - reduce pain and stiffness 8) extra virgin olive oil - its compound olecanthal helps to block prostaglandins  9) chili peppers- can be eaten or applied topically via capsaicin 10) mint- helpful for headache, muscle aches, joint pain, and itching 11) garlic- reduces inflammation 12) Green tea- reduces inflammation and oxidative stress, helps with weight loss, may reduce the risk of cancer, recommend Double [employer] of Tea daily  2) Insomnia: -continue tart cherry juice at night -encouraged napping during the day if her son is napping -discussed that sleep has been better -discussed that this is her related to her PTSD -continue Seroquel -discussed that she wakes up if her pain Link to further information on diet for chronic pain: http://www.bray.com/  -Try to go outside near sunrise -Get exercise during the day.  -Turn off all devices an hour before bedtime.  -Teas that can benefit: chamomile, valerian root, Brahmi (Bacopa) -Can consider over the counter melatonin, magnesium, and/or L-theanine. Melatonin is an anti-oxidant with multiple health benefits. Magnesium is involved in  greater than 300 enzymatic reactions in the body and most of us  are deficient as our soil is often depleted. There are 7 different types of magnesium- Bioptemizer's is a supplement with all 7 types, and each has unique benefits. Magnesium can also help with constipation and anxiety.  -Pistachios naturally increase the production of melatonin -Cozy Earth bamboo bed sheets are free from toxic chemicals.  -Tart cherry juice or a tart cherry supplement can improve sleep and soreness post-workout  3) Placenta previa:  -discussed that her tubes, uterus, and cervix were removed  4) PTSD: continue Seroquel, can consider replacing with topamax , discuss with psychology and psychiatry  5) Anxiety/depression: continue gabapentin  and cymbalta  6) Allergies: pepcid  prescribed  7) Sciatica: -discussed that this has been rough the last couple of days -discussed that it is present on both sides  -discussed PT  8) Numbness in back: -discussed that it also feels like bugs are crawling on her -Discussed Qutenza as an option for neuropathic pain control. Discussed that this is a capsaicin patch, stronger than capsaicin cream. Discussed that it is currently approved for diabetic peripheral neuropathy and post-herpetic neuralgia, but that it has also shown benefit in treating other forms of neuropathy. Provided patient with link to site to learn more about the patch: https://www.clark.biz/. Discussed that the patch would be placed in office and benefits usually last 3 months. Discussed that unintended exposure to capsaicin can cause severe irritation of eyes, mucous membranes, respiratory tract, and skin, but that Qutenza is a local treatment and does not have the systemic side effects of other nerve medications. Discussed that there may be pain, itching, erythema, and decreased sensory function associated with the application of Qutenza. Side effects usually subside within 1 week. A cold pack of analgesic  medications can help with these side effects. Blood pressure can also be increased due to pain associated with administration of the patch.   9) Brain fog: -discussed  weaning off gabapentin  -discussed that she could try just taking gabapentin  at night  10) Overweight: -discussed that she feels really good with whole foods plant based diet  -discussed that she has lost 7 lbs!  10 minutes spent in discussion of her pain, depression, anxiety, PTSD, discussed that she has never tried prazosin and would like to, prescribed 1mg  HS, discussed that she would like to try lyrica instead of gabapentin , prescribed 50mg  lyrica to replace her 600mg  gabapentin 

## 2024-01-20 DIAGNOSIS — F4310 Post-traumatic stress disorder, unspecified: Secondary | ICD-10-CM | POA: Diagnosis not present

## 2024-01-20 DIAGNOSIS — F322 Major depressive disorder, single episode, severe without psychotic features: Secondary | ICD-10-CM | POA: Diagnosis not present

## 2024-01-20 DIAGNOSIS — F411 Generalized anxiety disorder: Secondary | ICD-10-CM | POA: Diagnosis not present

## 2024-01-27 DIAGNOSIS — F411 Generalized anxiety disorder: Secondary | ICD-10-CM | POA: Diagnosis not present

## 2024-01-27 DIAGNOSIS — F4310 Post-traumatic stress disorder, unspecified: Secondary | ICD-10-CM | POA: Diagnosis not present

## 2024-01-27 DIAGNOSIS — F322 Major depressive disorder, single episode, severe without psychotic features: Secondary | ICD-10-CM | POA: Diagnosis not present

## 2024-01-28 ENCOUNTER — Encounter (HOSPITAL_BASED_OUTPATIENT_CLINIC_OR_DEPARTMENT_OTHER): Admitting: Physical Medicine and Rehabilitation

## 2024-01-28 DIAGNOSIS — E663 Overweight: Secondary | ICD-10-CM | POA: Diagnosis not present

## 2024-01-28 DIAGNOSIS — M797 Fibromyalgia: Secondary | ICD-10-CM | POA: Diagnosis not present

## 2024-01-28 NOTE — Progress Notes (Signed)
 Subjective:    Patient ID: Laurie Casey, female    DOB: 1985/11/20, 38 y.o.   MRN: [id number]  HPI An audio/video tele-health visit is felt to be the most appropriate encounter for this patient at this time. This is a follow up tele-visit via phone. The patient is at home. MD is at office. Prior to scheduling this appointment, our staff discussed the limitations of evaluation and management by telemedicine and the availability of in-person appointments. The patient expressed understanding and agreed to proceed. SABRA   1) Fibromyalgia: -has been having a lot of body pain and she thinks this is from lack of activity -her insurance denied aquatherapy because she was doing PT -she has been doing well with Lyrica  and Prazosin  -her PT wanted her to do aquatherapy -the EMDR therapy was not covered by her insurance -she has been struggling with insomnia, depression, anxiety, PTSD -her psychologist is switching practices and she plans to follow her -she plans to get a new psychiatrist at the new practice too -she has been having a lot of pain recently -recently her current partner has been causing her a lot of stress as he has been unkind to her -she feels financially dependent on him since their car and home is in his name -she asks about ketamine Her pain has been severe -she is interested in increasing her topamax  dose -has 4 kids- first when she was 69 -has had fibro for 7 years -she was in a very toxic marriage with someone who was 100 percent disabled from the [employer] -she left her marriage with her kids in the middle of the night -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 -she started taking tart cherry juice at night  4) Numbness and tingling: -started in 2001 when she  lost function of her arm -this does bother her  Pain Inventory Average Pain 7 Pain Right Now 7 My pain is intermittent, sharp, burning, dull, stabbing, tingling, and aching  In the last 24 hours, has pain interfered with the following? General activity 8 Relation with others 1 Enjoyment of life 10 What TIME of day is your pain at its worst? morning , daytime, evening, and night Sleep (in general) Fair  Pain is worse with: walking, bending, sitting, inactivity, standing, unsure, and some activites Pain improves with: rest, heat/ice, and TENS Relief from Meds: 2  walk without assistance use a cane how many minutes can you walk? Less than 30 minutes ability to climb steps?  yes do you drive?  yes Do you have any goals in this area?  yes  not employed: date last employed 09/29/2023 I need assistance with the following:  household duties and shopping Do you have any goals in this area?  yes  bladder control problems weakness numbness tremor tingling trouble walking spasms dizziness confusion depression anxiety  Any changes since last visit?  no  Any changes since last visit?  no    Family History  Problem Relation Age of Onset   Hypertension Father    Social History   Socioeconomic History   Marital status: Significant Other    Spouse name: Not on file   Number of children: 3   Years of education: Not on file  Highest education level: Not on file  Occupational History   Occupation: Vet tech  Tobacco Use   Smoking status: Former    Current packs/day: 0.00    Types: Cigarettes    Quit date: 10/12/2020    Years since quitting: 3.2   Smokeless tobacco: Never   Tobacco comments:    off and on   Vaping Use   Vaping status: Never Used  Substance and Sexual Activity   Alcohol use: Not Currently   Drug use: Never   Sexual activity: Not on file  Other Topics Concern   Not on file  Social History Narrative   Not on file   Social Drivers of Health    Financial Resource Strain: Low Risk  (07/23/2023)   Overall Financial Resource Strain (CARDIA)    Difficulty of Paying Living Expenses: Not hard at all  Food Insecurity: No Food Insecurity (07/23/2023)   Hunger Vital Sign    Worried About Running Out of Food in the Last Year: Never true    Ran Out of Food in the Last Year: Never true  Transportation Needs: No Transportation Needs (07/23/2023)   PRAPARE - Administrator, Civil Service (Medical): No    Lack of Transportation (Non-Medical): No  Physical Activity: Sufficiently Active (07/23/2023)   Exercise Vital Sign    Days of Exercise per Week: 5 days    Minutes of Exercise per Session: 40 min  Stress: Stress Concern Present (07/23/2023)   [employer] of Occupational Health - Occupational Stress Questionnaire    Feeling of Stress : Very much  Social Connections: Socially Isolated (07/23/2023)   Social Connection and Isolation Panel    Frequency of Communication with Friends and Family: Never    Frequency of Social Gatherings with Friends and Family: Never    Attends Religious Services: Never    Database administrator or Organizations: No    Attends Banker Meetings: Never    Marital Status: Living with partner   Past Surgical History:  Procedure Laterality Date   CESAREAN SECTION  2013, 2015, 2019   x2   CESAREAN SECTION  03/24/2023   with partial historectomy with uterus, cervix and tube removal   CYSTOSCOPY  2008 or 2009   CYSTOSCOPY W/ URETERAL STENT PLACEMENT Right 11/29/2020   Procedure: CYSTOSCOPY WITH RETROGRADE PYELOGRAM/URETERAL STENT PLACEMENT;  Surgeon: Matilda Senior, MD;  Location: [facility];  Service: Urology;  Laterality: Right;   CYSTOSCOPY/URETEROSCOPY/HOLMIUM LASER/STENT PLACEMENT Right 12/13/2020   Procedure: CYSTOSCOPY RIGHT URETEROSCOPY/HOLMIUM LASER/STENT EXTRACTION AND RIGHT JJ STENT PLACEMENT, RIGHT RETROGRADE URETEROSCOPY;  Surgeon: Matilda Senior, MD;  Location: [facility];  Service:  Urology;  Laterality: Right;   URETER SURGERY     x2   WISDOM TOOTH EXTRACTION     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  Opioid Risk Score:   Fall Risk Score:  `1  Depression screen PHQ 2/9     12/21/2023    2:08 PM 11/13/2023   11:10 AM 07/23/2023    1:32 PM 05/22/2023   12:01  PM 05/22/2023   11:47 AM 10/01/2022    8:50 AM 08/11/2022    3:15 PM  Depression screen PHQ 2/9  Decreased Interest 1 1 1 1 1 1 2   Down, Depressed, Hopeless 1 1 1 1 1  0 1  PHQ - 2 Score 2 2 2 2 2 1 3   Altered sleeping  1 0   0 0  Tired, decreased energy  1 2 0 0 2 2  Change in appetite  3 0 1 1 1  0  Feeling bad or failure about yourself   0 0 0 0 0 0  Trouble concentrating  1 1 1 1  0 0  Moving slowly or fidgety/restless  1 0 0 0 0 0  Suicidal thoughts  0 0 3 0 0 0  PHQ-9 Score  9 5   4 5   Difficult doing work/chores   Somewhat difficult        Review of Systems  Cardiovascular:  Positive for leg swelling.  Gastrointestinal:  Positive for constipation and diarrhea.  Genitourinary:  Positive for frequency.  Musculoskeletal:        Trouble walking- off balance, pain in knees, hips, ankles  Neurological:  Positive for tremors, weakness and numbness.       Tingling  Psychiatric/Behavioral:  Positive for confusion.        Depression, anxiety  All other systems reviewed and are negative.      Objective:   Physical Exam PRIOR EXAM: Gen: no distress, normal appearing HEENT: oral mucosa pink and moist, NCAT Cardio: Reg rate Chest: normal effort, normal rate of breathing Abd: soft, non-distended Ext: no  edema Psych: pleasant, normal affect Skin: intact Neuro: Alert and oriented x3 MSK: diffuse pain     Assessment & Plan:   1) Chronic Pain Syndrome secondary to fibromyalgia  -discussed that she was having facial pain and her pain was swollen and hard to talk, discussed that when she massages her masseter muscle   -discussed her pain, depression, anxiety, PTSD, discussed that she has never tried prazosin  and would like to, prescribed 1mg  HS, discussed that she would like to try lyrica  instead of gabapentin , prescribed 50mg  lyrica  to replace her 600mg  gabapentin   -discussed that she found a new partner that is supportive and his family is supportive -continue gabapenitn and cymbalta -discussed that pain has improved since stopping sugar and caffeine -continue PT, discussed that she is working on core strengthening, discussed aquatherapy  -discussed her current stressors, discussed her worsening pain, discussed ketamine and that our medical director has asked that we not prescribe this therapy, discussed that I could provide her a referral to other practices for it but she defers at this time, discussed increasing topamax  and she would like to try this, advised increasing night time dose but maintaining current daytime dose given her daytime fatigue, discussed EMDR therapy for her history of trauma, recommended [facility] Nadean Schultze, recommended couples counseling if her partner is willing to participate, discussed the importance of having a supportive partner in recovering from fibromyalgia  -discussed that she swam competitively as a child  Discussed extracorporeal shockwave therapy as a modality for treatment. Discussed that the device looks and feels like a massage gun and I would move it over the area of pain for about 10 minutes. The device releases sound waves to the area of pain and helps to improve blood flow and circulation to improve the healing process. Discuss that this initially  induces inflammation and can sometimes cause short-term  increase in pain. Discussed that we typically do three weekly treatments, but sometimes up to 6 if needed, and after 6 weeks long term benefits can sometimes be achieved. Discussed that this is an FDA approved device, but not covered by insurance and would cost $60 per session. Will scheduled patient for 6 consecutive appointments and can cancel latter three if benefits are achieved after first three sessions.    -discussed red light therapy  -discussed that she sees a psychiatrist and psychologist in [facility] -Discussed current symptoms of pain and history of pain.  -Discussed benefits of exercise in reducing pain. -Discussed following foods that may reduce pain: 1) Ginger (especially studied for arthritis)- reduce leukotriene production to decrease inflammation 2) Blueberries- high in phytonutrients that decrease inflammation 3) Salmon- marine omega-3s reduce joint swelling and pain 4) Pumpkin seeds- reduce inflammation 5) dark chocolate- reduces inflammation 6) turmeric- reduces inflammation 7) tart cherries - reduce pain and stiffness 8) extra virgin olive oil - its compound olecanthal helps to block prostaglandins  9) chili peppers- can be eaten or applied topically via capsaicin 10) mint- helpful for headache, muscle aches, joint pain, and itching 11) garlic- reduces inflammation 12) Green tea- reduces inflammation and oxidative stress, helps with weight loss, may reduce the risk of cancer, recommend Double [employer] of Tea daily  2) Insomnia: -continue tart cherry juice at night -encouraged napping during the day if her son is napping -discussed that sleep has been better -discussed that this is her related to her PTSD -continue Seroquel -discussed that she wakes up if her pain Link to further information on diet for chronic pain:  http://www.bray.com/  -Try to go outside near sunrise -Get exercise during the day.  -Turn off all devices an hour before bedtime.  -Teas that can benefit: chamomile, valerian root, Brahmi (Bacopa) -Can consider over the counter melatonin, magnesium, and/or L-theanine. Melatonin is an anti-oxidant with multiple health benefits. Magnesium is involved in greater than 300 enzymatic reactions in the body and most of us  are deficient as our soil is often depleted. There are 7 different types of magnesium- Bioptemizer's is a supplement with all 7 types, and each has unique benefits. Magnesium can also help with constipation and anxiety.  -Pistachios naturally increase the production of melatonin -Cozy Earth bamboo bed sheets are free from toxic chemicals.  -Tart cherry juice or a tart cherry supplement can improve sleep and soreness post-workout  3) Placenta previa:  -discussed that her tubes, uterus, and cervix were removed  4) PTSD: d/c seroquel, can consider replacing with topamax , discuss with psychology and psychiatry -continue prazosin   5) Anxiety/depression: continue gabapentin  and cymbalta  6) Allergies: pepcid  prescribed  7) Sciatica: -discussed that this has been rough the last couple of days -discussed that it is present on both sides  -discussed PT  8) Numbness in back: -discussed that it also feels like bugs are crawling on her -Discussed Qutenza as an option for neuropathic pain control. Discussed that this is a capsaicin patch, stronger than capsaicin cream. Discussed that it is currently approved for diabetic peripheral neuropathy and post-herpetic neuralgia, but that it has also shown benefit in treating other forms of neuropathy. Provided patient with link to site to learn more about the patch: https://www.clark.biz/. Discussed that the patch would be placed in office and benefits usually last 3 months.  Discussed that unintended exposure to capsaicin can cause severe irritation of eyes, mucous membranes, respiratory tract, and skin, but that Qutenza is  a local treatment and does not have the systemic side effects of other nerve medications. Discussed that there may be pain, itching, erythema, and decreased sensory function associated with the application of Qutenza. Side effects usually subside within 1 week. A cold pack of analgesic medications can help with these side effects. Blood pressure can also be increased due to pain associated with administration of the patch.   9) Brain fog: -discussed weaning off gabapentin  -discussed that she could try just taking gabapentin  at night  10) Overweight: -discussed that she feels really good with whole foods plant based diet  -discussed that she has lost 17 lbs! -discussed that she cheated a little but but now she is back to following her diet -encouraged eating a lot and of fruits and vegetables -discussed that she is eating salads with walnuts, mushrooms, strawberries, raspberries, red wine vinegar -discussed that she eats fish more than red meat -discussed that she makes her own peanut butter  11) Depression: -discussed that her kids moved in with her dad -discussed that her youngest is still with her -discussed that she still gets to see her daughters and they are living about an hour and 15 minutes away -discussed that she is trying to reform relationships that she was forced out of before -encouraged saffron -encouraged exercise  12 minutes spent in discussion of her pain, that the prazosin  and Lyrica  have been helpful, discussed that she has been sleeping better at night and no longer waking at 3am, discussed that PTSD has been seroquel, discussed that depression has been severe, encouraged eating a lot of fruits and vegetables

## 2024-02-03 DIAGNOSIS — F4310 Post-traumatic stress disorder, unspecified: Secondary | ICD-10-CM | POA: Diagnosis not present

## 2024-02-03 DIAGNOSIS — F322 Major depressive disorder, single episode, severe without psychotic features: Secondary | ICD-10-CM | POA: Diagnosis not present

## 2024-02-03 DIAGNOSIS — F411 Generalized anxiety disorder: Secondary | ICD-10-CM | POA: Diagnosis not present

## 2024-02-10 DIAGNOSIS — F411 Generalized anxiety disorder: Secondary | ICD-10-CM | POA: Diagnosis not present

## 2024-02-10 DIAGNOSIS — F322 Major depressive disorder, single episode, severe without psychotic features: Secondary | ICD-10-CM | POA: Diagnosis not present

## 2024-02-10 DIAGNOSIS — F4310 Post-traumatic stress disorder, unspecified: Secondary | ICD-10-CM | POA: Diagnosis not present

## 2024-02-12 DIAGNOSIS — F32A Depression, unspecified: Secondary | ICD-10-CM | POA: Diagnosis not present

## 2024-02-12 DIAGNOSIS — F419 Anxiety disorder, unspecified: Secondary | ICD-10-CM | POA: Diagnosis not present

## 2024-02-12 DIAGNOSIS — F4310 Post-traumatic stress disorder, unspecified: Secondary | ICD-10-CM | POA: Diagnosis not present

## 2024-02-16 DIAGNOSIS — F411 Generalized anxiety disorder: Secondary | ICD-10-CM | POA: Diagnosis not present

## 2024-02-16 DIAGNOSIS — F322 Major depressive disorder, single episode, severe without psychotic features: Secondary | ICD-10-CM | POA: Diagnosis not present

## 2024-02-16 DIAGNOSIS — F4310 Post-traumatic stress disorder, unspecified: Secondary | ICD-10-CM | POA: Diagnosis not present

## 2024-02-20 ENCOUNTER — Encounter: Payer: Self-pay | Admitting: Internal Medicine

## 2024-02-22 DIAGNOSIS — F4310 Post-traumatic stress disorder, unspecified: Secondary | ICD-10-CM | POA: Diagnosis not present

## 2024-02-22 DIAGNOSIS — F411 Generalized anxiety disorder: Secondary | ICD-10-CM | POA: Diagnosis not present

## 2024-02-22 DIAGNOSIS — F322 Major depressive disorder, single episode, severe without psychotic features: Secondary | ICD-10-CM | POA: Diagnosis not present

## 2024-02-23 ENCOUNTER — Other Ambulatory Visit: Payer: Self-pay | Admitting: Internal Medicine

## 2024-02-23 DIAGNOSIS — S99911D Unspecified injury of right ankle, subsequent encounter: Secondary | ICD-10-CM

## 2024-02-24 ENCOUNTER — Other Ambulatory Visit: Payer: Self-pay | Admitting: Internal Medicine

## 2024-02-24 DIAGNOSIS — I10 Essential (primary) hypertension: Secondary | ICD-10-CM

## 2024-02-24 MED ORDER — AMLODIPINE BESYLATE 10 MG PO TABS: 10.0000 mg | ORAL_TABLET | Freq: Every day | ORAL | 1 refills | Status: DC

## 2024-02-24 MED ORDER — TIZANIDINE HCL 2 MG PO TABS: 2.0000 mg | ORAL_TABLET | Freq: Two times a day (BID) | ORAL | 4 refills | Status: DC | PRN

## 2024-02-29 DIAGNOSIS — F4310 Post-traumatic stress disorder, unspecified: Secondary | ICD-10-CM | POA: Diagnosis not present

## 2024-02-29 DIAGNOSIS — F322 Major depressive disorder, single episode, severe without psychotic features: Secondary | ICD-10-CM | POA: Diagnosis not present

## 2024-02-29 DIAGNOSIS — F411 Generalized anxiety disorder: Secondary | ICD-10-CM | POA: Diagnosis not present

## 2024-03-03 ENCOUNTER — Encounter: Attending: Physical Medicine and Rehabilitation | Admitting: Physical Medicine and Rehabilitation

## 2024-03-07 ENCOUNTER — Encounter: Payer: Self-pay | Admitting: Internal Medicine

## 2024-03-08 ENCOUNTER — Other Ambulatory Visit: Payer: Self-pay | Admitting: Medical Genetics

## 2024-03-09 ENCOUNTER — Encounter: Payer: Self-pay | Admitting: Family Medicine

## 2024-03-09 ENCOUNTER — Ambulatory Visit: Payer: Self-pay

## 2024-03-09 ENCOUNTER — Ambulatory Visit: Attending: Family Medicine | Admitting: Family Medicine

## 2024-03-09 VITALS — BP 123/83 | HR 94 | Ht 65.0 in | Wt 169.4 lb

## 2024-03-09 DIAGNOSIS — W19XXXA Unspecified fall, initial encounter: Secondary | ICD-10-CM

## 2024-03-09 DIAGNOSIS — S0003XA Contusion of scalp, initial encounter: Secondary | ICD-10-CM

## 2024-03-09 DIAGNOSIS — M797 Fibromyalgia: Secondary | ICD-10-CM | POA: Diagnosis not present

## 2024-03-09 DIAGNOSIS — I10 Essential (primary) hypertension: Secondary | ICD-10-CM

## 2024-03-09 DIAGNOSIS — M7661 Achilles tendinitis, right leg: Secondary | ICD-10-CM | POA: Diagnosis not present

## 2024-03-09 DIAGNOSIS — M25571 Pain in right ankle and joints of right foot: Secondary | ICD-10-CM | POA: Diagnosis not present

## 2024-03-09 NOTE — Telephone Encounter (Signed)
 FYI Only or Action Required?: FYI only for provider.  Patient was last seen in primary care on 11/20/2023 by Vicci Barnie NOVAK, MD.  Called Nurse Triage reporting Fall.  Symptoms began several days ago.  Symptoms are: stable.  Triage Disposition: See PCP When Office is Open (Within 3 Days)  Patient/caregiver understands and will follow disposition?: Yes      Copied from CRM #[id number]. Topic: Clinical - Red Word Triage >> Mar 09, 2024 12:16 PM Fonda T wrote: Red Word that prompted transfer to Nurse Triage: Received call from patient, states she had fell in shower and hit head, on Sunday 03/06/24.  Patient now reports pain in back in head.       Reason for Disposition  MILD weakness (e.g., does not interfere with ability to work, go to school, normal activities)  (Exception: Mild weakness is a chronic symptom.)  Answer Assessment - Initial Assessment Questions 1. MECHANISM: How did the fall happen?     Fell in shower  2. DOMESTIC VIOLENCE AND ELDER ABUSE SCREENING: Did you fall because someone pushed you or tried to hurt you? If Yes, ask: Are you safe now?     No 3. ONSET: When did the fall happen? (e.g., minutes, hours, or days ago)     03/06/24 4. LOCATION: What part of the body hit the ground? (e.g., back, buttocks, head, hips, knees, hands, head, stomach)     Hit back of head  5. INJURY: Did you hurt (injure) yourself when you fell? If Yes, ask: What did you injure? Tell me more about this? (e.g., body area; type of injury; pain severity)     Hit head  6. PAIN: Is there any pain? If Yes, ask: How bad is the pain? (e.g., Scale 0-10; or none, mild,      3/10 7. SIZE: For cuts, bruises, or swelling, ask: How large is it? (e.g., inches or centimeters)      N/A 9. OTHER SYMPTOMS: Do you have any other symptoms? (e.g., dizziness, fever, weakness; new-onset or worsening).      Mild soreness in neck and shoulders, mild weakness that she states isn't  new 10. CAUSE: What do you think caused the fall (or falling)? (e.g., dizzy spell, tripped)       Slipped in shower  Protocols used: Falls and [facility]

## 2024-03-09 NOTE — Progress Notes (Signed)
 Subjective:  Patient ID: Laurie Casey, female    DOB: 06-06-1986  Age: 38 y.o. MRN: [id number]  CC: Fall     Discussed the use of AI scribe software for clinical note transcription with the patient, who gave verbal consent to proceed.  History of Present Illness Laurie Casey is a 38 year old female with fibromyalgia, hypertension who presents after a fall in the shower.  Three days ago, Laurie Casey fell in the shower after standing up to retrieve a loofah, hitting the back of her head on the faucet. Laurie Casey did not lose consciousness but felt tired afterwards. Laurie Casey denies blurry vision, vomiting, cognitive changes, or bruising.  Her fibromyalgia sometimes causes dizziness and shortness of breath, affecting her ability to shower regularly. Her hips occasionally give out, leading to previous falls. Laurie Casey uses ice and takes Advil  for her ankle and tizanidine  for her back, which Laurie Casey finds helpful.     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   Pre-eclampsia    PTSD (post-traumatic stress disorder) 06/30/2022    Past Surgical History:  Procedure Laterality Date   CESAREAN SECTION  2013, 2015, 2019   x2   CESAREAN SECTION  03/24/2023   with partial historectomy with uterus, cervix and tube removal   CYSTOSCOPY  2008 or 2009   CYSTOSCOPY W/ URETERAL STENT PLACEMENT Right 11/29/2020   Procedure: CYSTOSCOPY WITH RETROGRADE PYELOGRAM/URETERAL STENT PLACEMENT;  Surgeon: Matilda Senior, MD;  Location: [facility] OR;  Service: Urology;  Laterality: Right;   CYSTOSCOPY/URETEROSCOPY/HOLMIUM LASER/STENT PLACEMENT Right 12/13/2020   Procedure: CYSTOSCOPY RIGHT URETEROSCOPY/HOLMIUM LASER/STENT EXTRACTION AND RIGHT JJ  STENT PLACEMENT, RIGHT RETROGRADE URETEROSCOPY;  Surgeon: Matilda Senior, MD;  Location: [facility];  Service: Urology;  Laterality: Right;   URETER SURGERY     x2   WISDOM TOOTH EXTRACTION      Family History  Problem Relation Age of Onset   Hypertension Father     Social History   Socioeconomic History   Marital status: Significant Other    Spouse name: Not on file   Number of children: 3   Years of education: Not on file   Highest education level: Not on file  Occupational History   Occupation: Vet tech  Tobacco Use   Smoking status: Former    Current packs/day: 0.00    Types: Cigarettes    Quit date: 10/12/2020    Years since quitting: 3.4   Smokeless tobacco: Never   Tobacco comments:    off and on   Vaping Use   Vaping status: Never Used  Substance and Sexual Activity   Alcohol use: Not Currently   Drug use: Never   Sexual activity: Not on file  Other Topics Concern   Not on file  Social History Narrative   Not on file   Social Drivers of Health   Financial Resource Strain: Low Risk  (07/23/2023)   Overall Financial Resource Strain (CARDIA)    Difficulty of Paying Living Expenses: Not hard at all  Food Insecurity: No Food Insecurity (07/23/2023)   Hunger Vital Sign    Worried About Running Out of Food in the Last Year: Never true    Ran  Out of Food in the Last Year: Never true  Transportation Needs: No Transportation Needs (07/23/2023)   PRAPARE - Administrator, Civil Service (Medical): No    Lack of Transportation (Non-Medical): No  Physical Activity: Sufficiently Active (07/23/2023)   Exercise Vital Sign    Days of Exercise per Week: 5 days    Minutes of Exercise per Session: 40 min  Stress: Stress Concern Present (07/23/2023)   [employer] of Occupational Health - Occupational Stress Questionnaire    Feeling of Stress : Very much  Social Connections: Socially Isolated (07/23/2023)   Social Connection and Isolation Panel    Frequency of  Communication with Friends and Family: Never    Frequency of Social Gatherings with Friends and Family: Never    Attends Religious Services: Never    Database administrator or Organizations: No    Attends Engineer, structural: Never    Marital Status: Living with partner    Allergies  Allergen Reactions   Tape Rash    Adhesive    Outpatient Medications Prior to Visit  Medication Sig Dispense Refill   amLODipine  (NORVASC ) 10 MG tablet Take 1 tablet (10 mg total) by mouth daily. 90 tablet 1   buPROPion (WELLBUTRIN XL) 300 MG 24 hr tablet Take 300 mg by mouth every morning.     DULoxetine (CYMBALTA) 30 MG capsule Take 30 mg by mouth 2 (two) times daily.     EPINEPHrine  0.3 mg/0.3 mL IJ SOAJ injection Inject 0.3 mg into the muscle as needed for anaphylaxis. 1 each 1   famotidine  (PEPCID ) 20 MG tablet Take 1 tablet (20 mg total) by mouth 2 (two) times daily as needed for heartburn or indigestion. 90 tablet 3   Multiple Vitamin (MULTIVITAMIN) tablet Take 1 tablet by mouth daily.     prazosin  (MINIPRESS ) 1 MG capsule Take 1 capsule (1 mg total) by mouth at bedtime. 90 capsule 3   pregabalin  (LYRICA ) 50 MG capsule Take 1 capsule (50 mg total) by mouth at bedtime. 90 capsule 3   tiZANidine  (ZANAFLEX ) 2 MG tablet Take 1 tablet (2 mg total) by mouth 2 (two) times daily as needed for muscle spasms. 30 tablet 4   topiramate  (TOPAMAX ) 25 MG tablet Take 1 tablet (25 mg total) by mouth 3 (three) times daily as needed. 270 tablet 3   XANAX  0.25 MG tablet Take 0.25 mg by mouth as needed.     No facility-administered medications prior to visit.     ROS Review of Systems  Constitutional:  Negative for activity change and appetite change.  HENT:  Negative for sinus pressure and sore throat.   Respiratory:  Negative for chest tightness, shortness of breath and wheezing.   Cardiovascular:  Negative for chest pain and palpitations.  Gastrointestinal:  Negative for abdominal distention,  abdominal pain and constipation.  Genitourinary: Negative.   Musculoskeletal: Negative.   Psychiatric/Behavioral:  Negative for behavioral problems and dysphoric mood.     Objective:  BP 123/83   Pulse 94   Ht 5' 5 (1.651 m)   Wt 169 lb 6.4 oz (76.8 kg)   SpO2 99%   BMI 28.19 kg/m      03/09/2024    1:36 PM 12/21/2023    2:06 PM 12/20/2023    5:15 PM  BP/Weight  Systolic BP 123 135 129  Diastolic BP 83 89 95  Wt. (Lbs) 169.4 184   BMI 28.19 kg/m2 30.62 kg/m2  Physical Exam Constitutional:      Appearance: Laurie Casey is well-developed.  HENT:     Head:     Comments: Normal appearance of occiput with no induration, no erythema, no contusion Cardiovascular:     Rate and Rhythm: Normal rate.     Heart sounds: Normal heart sounds. No murmur heard. Pulmonary:     Effort: Pulmonary effort is normal.     Breath sounds: Normal breath sounds. No wheezing or rales.  Chest:     Chest wall: No tenderness.  Abdominal:     General: Bowel sounds are normal. There is no distension.     Palpations: Abdomen is soft. There is no mass.     Tenderness: There is no abdominal tenderness.  Musculoskeletal:        General: Normal range of motion.     Right lower leg: No edema.     Left lower leg: No edema.  Neurological:     Mental Status: Laurie Casey is alert and oriented to person, place, and time.  Psychiatric:        Mood and Affect: Mood normal.        Latest Ref Rng & Units 10/01/2022   10:04 AM 08/20/2022    8:30 AM 04/14/2022   10:18 AM  CMP  Glucose 70 - 99 mg/dL 88  99  898   BUN 6 - 20 mg/dL 11  11  9    Creatinine 0.57 - 1.00 mg/dL 9.27  8.96  9.04   Sodium 134 - 144 mmol/L 137  134  139   Potassium 3.5 - 5.2 mmol/L 4.2  3.7  4.1   Chloride 96 - 106 mmol/L 106  102  108   CO2 20 - 29 mmol/L 16  23  21    Calcium 8.7 - 10.2 mg/dL 9.6  9.2  9.7   Total Protein 6.0 - 8.5 g/dL 6.7  6.8    Total Bilirubin 0.0 - 1.2 mg/dL 0.3  0.3    Alkaline Phos 44 - 121 IU/L 74  57    AST 0 - 40  IU/L 23  29    ALT 0 - 32 IU/L 16  27      Lipid Panel  No results found for: CHOL, TRIG, HDL, CHOLHDL, VLDL, LDLCALC, LDLDIRECT  CBC    Component Value Date/Time   WBC 14.0 (H) 10/01/2022 1004   WBC 5.4 08/20/2022 0830   RBC 4.19 10/01/2022 1004   RBC 4.07 08/20/2022 0830   HGB 13.4 10/01/2022 1004   HCT 39.4 10/01/2022 1004   PLT 169 10/01/2022 1004   MCV 94 10/01/2022 1004   MCH 32.0 10/01/2022 1004   MCH 31.0 08/20/2022 0830   MCHC 34.0 10/01/2022 1004   MCHC 33.6 08/20/2022 0830   RDW 12.9 10/01/2022 1004   LYMPHSABS 2.4 10/01/2022 1004   MONOABS 1.3 (H) 08/20/2022 0830   EOSABS 0.3 10/01/2022 1004   BASOSABS 0.1 10/01/2022 1004    Lab Results  Component Value Date   HGBA1C 5.4 10/01/2022       Assessment & Plan Fall Head contusion after fall Recent fall caused a head contusion with no loss of consciousness or neurological symptoms.  No bruise evident, no induration.  No intracranial bleed or concussion. Symptoms improving. -Laurie Casey has no evidence of hypotension - Monitor for new symptoms such as confusion or cognitive changes. - Recommend ice application for pain management. - CT scan not indicated. - Encourage use of non-slip mats and shower chair to  prevent falls.  Fibromyalgia Chronic condition contributing to dizziness and fatigue. - Continue tizanidine  for muscle relaxation.         No orders of the defined types were placed in this encounter.   Follow-up: No follow-ups on file.       Corrina Sabin, MD, FAAFP. [facility] [place], [place] [phone]   03/09/2024, 3:03 PM

## 2024-03-09 NOTE — Patient Instructions (Signed)
 VISIT SUMMARY:  Today, you were seen for a follow-up after a fall in the shower. You hit the back of your head but did not lose consciousness or experience any severe symptoms. We also reviewed your ongoing conditions, including fibromyalgia, high blood pressure, chronic back pain, and chronic ankle pain.  YOUR PLAN:  -HEAD CONTUSION AFTER FALL: You have a bruise on your head from the fall, but there are no signs of a serious injury like a concussion or internal bleeding. Please monitor for any new symptoms such as confusion or changes in thinking. Use ice to help with pain. A CT scan is not needed at this time. To prevent future falls, use non-slip mats and a shower chair.  -FIBROMYALGIA: Fibromyalgia is a chronic condition that causes pain, dizziness, and fatigue. Continue taking tizanidine  to help relax your muscles.  -HYPERTENSION: Your blood pressure is currently within the normal range at 123/83 mmHg. No changes to your treatment are needed.  -CHRONIC BACK PAIN: Your chronic back pain is being managed with tizanidine , and there are no new issues to address at this time.  -CHRONIC ANKLE PAIN: Your chronic ankle pain is being managed with Advil , and there are no new issues to address at this time.  INSTRUCTIONS:  Please monitor for any new symptoms related to your head contusion, such as confusion or changes in thinking. Use ice for pain relief. To prevent future falls, use non-slip mats and a shower chair in the shower. Continue taking your medications as prescribed. Follow up with us  if you experience any new or worsening symptoms.

## 2024-03-10 ENCOUNTER — Encounter: Attending: Physical Medicine and Rehabilitation | Admitting: Physical Medicine and Rehabilitation

## 2024-03-10 ENCOUNTER — Encounter: Payer: Self-pay | Admitting: Physical Medicine and Rehabilitation

## 2024-03-10 VITALS — BP 120/88 | HR 100 | Ht 65.0 in | Wt 169.0 lb

## 2024-03-10 DIAGNOSIS — F4310 Post-traumatic stress disorder, unspecified: Secondary | ICD-10-CM | POA: Diagnosis not present

## 2024-03-10 DIAGNOSIS — M7918 Myalgia, other site: Secondary | ICD-10-CM | POA: Diagnosis not present

## 2024-03-10 DIAGNOSIS — F322 Major depressive disorder, single episode, severe without psychotic features: Secondary | ICD-10-CM | POA: Diagnosis not present

## 2024-03-10 DIAGNOSIS — F411 Generalized anxiety disorder: Secondary | ICD-10-CM | POA: Diagnosis not present

## 2024-03-10 MED ORDER — LIDOCAINE HCL 1 % IJ SOLN
5.0000 mL | Freq: Once | INTRAMUSCULAR | Status: AC
  Administered 2024-03-10: 5 mL

## 2024-03-10 NOTE — Progress Notes (Signed)

## 2024-03-10 NOTE — Patient Instructions (Signed)
 Supplements: Vimergy Pure Encapsulations  Teas: Yogi, Numi, Organic [place]

## 2024-03-17 ENCOUNTER — Other Ambulatory Visit

## 2024-03-17 DIAGNOSIS — F4310 Post-traumatic stress disorder, unspecified: Secondary | ICD-10-CM | POA: Diagnosis not present

## 2024-03-17 DIAGNOSIS — F411 Generalized anxiety disorder: Secondary | ICD-10-CM | POA: Diagnosis not present

## 2024-03-17 DIAGNOSIS — F322 Major depressive disorder, single episode, severe without psychotic features: Secondary | ICD-10-CM | POA: Diagnosis not present

## 2024-03-21 ENCOUNTER — Telehealth: Payer: Self-pay | Admitting: Internal Medicine

## 2024-03-21 NOTE — Telephone Encounter (Signed)
 Confirmed appt for 9/9

## 2024-03-22 ENCOUNTER — Ambulatory Visit: Attending: Internal Medicine | Admitting: Internal Medicine

## 2024-03-22 ENCOUNTER — Encounter: Payer: Self-pay | Admitting: Internal Medicine

## 2024-03-22 VITALS — BP 110/80 | HR 78 | Temp 98.4°F | Ht 65.0 in | Wt 169.0 lb

## 2024-03-22 DIAGNOSIS — I10 Essential (primary) hypertension: Secondary | ICD-10-CM | POA: Diagnosis not present

## 2024-03-22 DIAGNOSIS — M797 Fibromyalgia: Secondary | ICD-10-CM

## 2024-03-22 DIAGNOSIS — Z23 Encounter for immunization: Secondary | ICD-10-CM

## 2024-03-22 DIAGNOSIS — Z87898 Personal history of other specified conditions: Secondary | ICD-10-CM | POA: Diagnosis not present

## 2024-03-22 DIAGNOSIS — E663 Overweight: Secondary | ICD-10-CM | POA: Diagnosis not present

## 2024-03-22 DIAGNOSIS — F411 Generalized anxiety disorder: Secondary | ICD-10-CM | POA: Diagnosis not present

## 2024-03-22 DIAGNOSIS — F330 Major depressive disorder, recurrent, mild: Secondary | ICD-10-CM | POA: Diagnosis not present

## 2024-03-22 DIAGNOSIS — F4310 Post-traumatic stress disorder, unspecified: Secondary | ICD-10-CM | POA: Diagnosis not present

## 2024-03-22 DIAGNOSIS — F322 Major depressive disorder, single episode, severe without psychotic features: Secondary | ICD-10-CM | POA: Diagnosis not present

## 2024-03-22 NOTE — Progress Notes (Signed)
 Patient ID: MADOLINE BHATT, female    DOB: [birth year]  MRN: [id number]  CC: Hypertension (HTN f/u. Thompson determining a diagnosis for ADHD /Yes to flu vax)   Subjective: Laurie Casey is a 38 y.o. female who presents for chronic ds management. Her concerns today include:  Patient with history of asthma, former smoker, anxiety disorder, migraines, preeclampsia, ureteral stone, PTSD/GAD/MDD (Followed by psychiatrist Prentice Orn, MD and therapist Nia at [facility]).   Discussed the use of AI scribe software for clinical note transcription with the patient, who gave verbal consent to proceed.  History of Present Illness Laurie Casey is a 38 year old female with hypertension and fibromyalgia who presents for follow-up on blood pressure and ADHD concerns.  Obesity: She has lost 23 pounds over the past four months, which she attributes to dietary changes. Her diet includes fruits, vegetables, and grass-fed meats, while she avoids processed foods, sugar, and caffeine. Her exercise routine involves gardening and caring for her one-year-old son. Ongoing ankle problems from a fall in June limit her ability to walk extensively. She is also on Topamax  for fibromyalgia through PMR which has contributed to wgh loss.  She will be starting water  aerobic therapy later this month.  HTN: Her home blood pressure readings have been generally good, with the highest systolic being 130 mmHg. She notes improvement in her blood pressure since her weight loss began in mid-June. She is currently taking amlodipine  10 mg daily   She is concerned about ADHD, noting her middle daughter has severe ADHD. She reflects on her own childhood learning difficulties and wonders if she might have undiagnosed ADHD. She is not seeking medication but is interested in exploring potential support options.  She is on bupropion  and Cymbalta for depression, and Lyrica /Topamax  for fibromyalgia.  She feels  both Lyrica  and Topamax  have helped myalgia symptoms.  Her psychiatrist suggested adding Rexulti to further help with depression, but she is hesitant due to cost and potential weight gain. She will be seeking a second opinion from a new psychiatrist at her therapist's new company. She experiences deep depressive episodes and is considering hormonal influences, having scheduled an appointment with her gynecologist for further evaluation.    Patient Active Problem List   Diagnosis Date Noted   Placenta accreta 12/03/2022   History of cesarean section 10/01/2022   Essential hypertension 08/11/2022   Obesity (BMI 30.0-34.9) 08/11/2022   High risk multigravida in third trimester 09/10/2017   Hx of preeclampsia, prior pregnancy, currently pregnant 03/16/2014     Current Outpatient Medications on File Prior to Visit  Medication Sig Dispense Refill   amLODipine  (NORVASC ) 10 MG tablet Take 1 tablet (10 mg total) by mouth daily. 90 tablet 1   buPROPion (WELLBUTRIN XL) 300 MG 24 hr tablet Take 300 mg by mouth every morning.     DULoxetine (CYMBALTA) 30 MG capsule Take 30 mg by mouth 2 (two) times daily.     EPINEPHrine  0.3 mg/0.3 mL IJ SOAJ injection Inject 0.3 mg into the muscle as needed for anaphylaxis. 1 each 1   famotidine  (PEPCID ) 20 MG tablet Take 1 tablet (20 mg total) by mouth 2 (two) times daily as needed for heartburn or indigestion. 90 tablet 3   Multiple Vitamin (MULTIVITAMIN) tablet Take 1 tablet by mouth daily.     prazosin  (MINIPRESS ) 1 MG capsule Take 1 capsule (1 mg total) by mouth at bedtime. 90 capsule 3   pregabalin  (LYRICA ) 50 MG  capsule Take 1 capsule (50 mg total) by mouth at bedtime. 90 capsule 3   REXULTI 1 MG TABS tablet Take 1 mg by mouth at bedtime.     tiZANidine  (ZANAFLEX ) 2 MG tablet Take 1 tablet (2 mg total) by mouth 2 (two) times daily as needed for muscle spasms. 30 tablet 4   topiramate  (TOPAMAX ) 25 MG tablet Take 1 tablet (25 mg total) by mouth 3 (three) times  daily as needed. 270 tablet 3   XANAX  0.25 MG tablet Take 0.25 mg by mouth as needed.     No current facility-administered medications on file prior to visit.    Allergies  Allergen Reactions   Tape Rash    Adhesive Develop SOB, hives and itching when exposed to adhesive glue see note 11/2023    Social History   Socioeconomic History   Marital status: Significant Other    Spouse name: Not on file   Number of children: 3   Years of education: Not on file   Highest education level: Not on file  Occupational History   Occupation: Vet tech  Tobacco Use   Smoking status: Former    Current packs/day: 0.00    Types: Cigarettes    Quit date: 10/12/2020    Years since quitting: 3.4   Smokeless tobacco: Never   Tobacco comments:    off and on   Vaping Use   Vaping status: Never Used  Substance and Sexual Activity   Alcohol use: Not Currently   Drug use: Never   Sexual activity: Not on file  Other Topics Concern   Not on file  Social History Narrative   Not on file   Social Drivers of Health   Financial Resource Strain: Low Risk  (07/23/2023)   Overall Financial Resource Strain (CARDIA)    Difficulty of Paying Living Expenses: Not hard at all  Food Insecurity: No Food Insecurity (07/23/2023)   Hunger Vital Sign    Worried About Running Out of Food in the Last Year: Never true    Ran Out of Food in the Last Year: Never true  Transportation Needs: No Transportation Needs (07/23/2023)   PRAPARE - Administrator, Civil Service (Medical): No    Lack of Transportation (Non-Medical): No  Physical Activity: Sufficiently Active (07/23/2023)   Exercise Vital Sign    Days of Exercise per Week: 5 days    Minutes of Exercise per Session: 40 min  Stress: Stress Concern Present (07/23/2023)   [employer] of Occupational Health - Occupational Stress Questionnaire    Feeling of Stress : Very much  Social Connections: Socially Isolated (07/23/2023)   Social Connection and  Isolation Panel    Frequency of Communication with Friends and Family: Never    Frequency of Social Gatherings with Friends and Family: Never    Attends Religious Services: Never    Database administrator or Organizations: No    Attends Banker Meetings: Never    Marital Status: Living with partner  Intimate Partner Violence: Not At Risk (07/23/2023)   Humiliation, Afraid, Rape, and Kick questionnaire    Fear of Current or Ex-Partner: No    Emotionally Abused: No    Physically Abused: No    Sexually Abused: No    Family History  Problem Relation Age of Onset   Hypertension Father     Past Surgical History:  Procedure Laterality Date   CESAREAN SECTION  2013, 2015, 2019   x2   CESAREAN  SECTION  03/24/2023   with partial historectomy with uterus, cervix and tube removal   CYSTOSCOPY  2008 or 2009   CYSTOSCOPY W/ URETERAL STENT PLACEMENT Right 11/29/2020   Procedure: CYSTOSCOPY WITH RETROGRADE PYELOGRAM/URETERAL STENT PLACEMENT;  Surgeon: Matilda Senior, MD;  Location: [facility] OR;  Service: Urology;  Laterality: Right;   CYSTOSCOPY/URETEROSCOPY/HOLMIUM LASER/STENT PLACEMENT Right 12/13/2020   Procedure: CYSTOSCOPY RIGHT URETEROSCOPY/HOLMIUM LASER/STENT EXTRACTION AND RIGHT JJ STENT PLACEMENT, RIGHT RETROGRADE URETEROSCOPY;  Surgeon: Matilda Senior, MD;  Location: [facility];  Service: Urology;  Laterality: Right;   URETER SURGERY     x2   WISDOM TOOTH EXTRACTION      ROS: Review of Systems Negative except as stated above  PHYSICAL EXAM: BP 110/80   Pulse 78   Temp 98.4 F (36.9 C) (Oral)   Ht 5' 5 (1.651 m)   Wt 169 lb (76.7 kg)   SpO2 100%   BMI 28.12 kg/m   Wt Readings from Last 3 Encounters:  03/22/24 169 lb (76.7 kg)  03/10/24 169 lb (76.7 kg)  03/09/24 169 lb 6.4 oz (76.8 kg)    Physical Exam  General appearance - alert, well appearing, and in no distress Mental status - normal mood, behavior, speech, dress, motor activity, and thought  processes Chest - clear to auscultation, no wheezes, rales or rhonchi, symmetric air entry Heart - normal rate, regular rhythm, normal S1, S2, no murmurs, rubs, clicks or gallops Extremities - peripheral pulses normal, no pedal edema, no clubbing or cyanosis     03/22/2024   11:14 AM 03/10/2024   10:23 AM 03/09/2024    1:38 PM  Depression screen PHQ 2/9  Decreased Interest 3 2 2   Down, Depressed, Hopeless 2 2 2   PHQ - 2 Score 5 4 4   Altered sleeping 1  3  Tired, decreased energy 2  3  Change in appetite 3  3  Feeling bad or failure about yourself  0  0  Trouble concentrating 3  0  Moving slowly or fidgety/restless 0  0  Suicidal thoughts 0  0  PHQ-9 Score 14  13  Difficult doing work/chores Very difficult  Very difficult       Latest Ref Rng & Units 10/01/2022   10:04 AM 08/20/2022    8:30 AM 04/14/2022   10:18 AM  CMP  Glucose 70 - 99 mg/dL 88  99  898   BUN 6 - 20 mg/dL 11  11  9    Creatinine 0.57 - 1.00 mg/dL 9.27  8.96  9.04   Sodium 134 - 144 mmol/L 137  134  139   Potassium 3.5 - 5.2 mmol/L 4.2  3.7  4.1   Chloride 96 - 106 mmol/L 106  102  108   CO2 20 - 29 mmol/L 16  23  21    Calcium 8.7 - 10.2 mg/dL 9.6  9.2  9.7   Total Protein 6.0 - 8.5 g/dL 6.7  6.8    Total Bilirubin 0.0 - 1.2 mg/dL 0.3  0.3    Alkaline Phos 44 - 121 IU/L 74  57    AST 0 - 40 IU/L 23  29    ALT 0 - 32 IU/L 16  27     Lipid Panel  No results found for: CHOL, TRIG, HDL, CHOLHDL, VLDL, LDLCALC, LDLDIRECT  CBC    Component Value Date/Time   WBC 14.0 (H) 10/01/2022 1004   WBC 5.4 08/20/2022 0830   RBC 4.19 10/01/2022 1004   RBC 4.07  08/20/2022 0830   HGB 13.4 10/01/2022 1004   HCT 39.4 10/01/2022 1004   PLT 169 10/01/2022 1004   MCV 94 10/01/2022 1004   MCH 32.0 10/01/2022 1004   MCH 31.0 08/20/2022 0830   MCHC 34.0 10/01/2022 1004   MCHC 33.6 08/20/2022 0830   RDW 12.9 10/01/2022 1004   LYMPHSABS 2.4 10/01/2022 1004   MONOABS 1.3 (H) 08/20/2022 0830   EOSABS 0.3  10/01/2022 1004   BASOSABS 0.1 10/01/2022 1004    ASSESSMENT AND PLAN: 1. Essential hypertension (Primary) Close to goal.  She will continue Norvasc  10 mg daily.  Continue limiting salt in the foods and incorporating fresh fruits and vegetables into the diet.  Continue to monitor home blood pressure with goal being less than 130/80. - CBC - Comprehensive metabolic panel with GFR - Lipid panel  2. Overweight (BMI 25.0-29.9) Commended her on weight loss so far.  Encouraged her to continue healthy eating habits with emphasis on plant-based diet.  Try to move as much as she can.  Plans to start with water  aerobics later this month.  3. Fibromyalgia Doing better on Lyrica  and Topamax .  4. Need for influenza vaccination Given today.  5. History of learning disability Recommend that she discuss her concerns about possible underlying ADHD with her psychiatrist.  6. Major depressive disorder, recurrent episode, mild (HCC) Recommend speaking with her current psychiatrist about her concerns about Rexulti before considering transferring to a new psychiatrist.  She has been with her current psychiatrist for several years and has a good rapport with him.  Patient was given the opportunity to ask questions.  Patient verbalized understanding of the plan and was able to repeat key elements of the plan.   This documentation was completed using Paediatric nurse.  Any transcriptional errors are unintentional.  Orders Placed This Encounter  Procedures   CBC   Comprehensive metabolic panel with GFR   Lipid panel     Requested Prescriptions    No prescriptions requested or ordered in this encounter    Return in about 4 months (around 07/22/2024).  Barnie Louder, MD, FACP

## 2024-03-22 NOTE — Patient Instructions (Signed)
 VISIT SUMMARY:  Today, we reviewed your blood pressure, fibromyalgia, and ADHD concerns. You have made significant progress with your weight loss and dietary changes, which have positively impacted your blood pressure. We also discussed your ongoing ankle pain, depression, and your concerns about ADHD.  YOUR PLAN:  -ESSENTIAL HYPERTENSION: Hypertension is high blood pressure. Your blood pressure is well-controlled with your current medication. Please continue taking amlodipine  10 mg daily.  -FIBROMYALGIA: Fibromyalgia is a condition characterized by widespread pain and sensitivity. Your current medications, Topamax  and Lyrica , are helping manage your symptoms. You will also start water  aerobics physical therapy later this month to help with pain management.  -CHRONIC PAIN OF LEFT ANKLE FOLLOWING FALL: You have ongoing pain in your left ankle from a fall, which limits your mobility. You will start water  aerobics physical therapy later this month to help manage this pain.  -DEPRESSION: Depression is a mood disorder that causes persistent feelings of sadness and loss of interest. You are currently taking bupropion and have concerns about adding Rexulti due to cost and potential weight gain. You will discuss these concerns with your current psychiatrist and follow up with your gynecologist in October to explore potential hormonal influences on your mood.  -OVERWEIGHT: You have lost 23 pounds over the past four months through dietary changes and physical activity. Please continue your current dietary habits and stay active through gardening and caring for your child.  INSTRUCTIONS:  Please continue taking your medications as prescribed. Start water  aerobics physical therapy later this month for your fibromyalgia and ankle pain. Discuss your concerns about Rexulti with your current psychiatrist and follow up with your gynecologist in October to explore potential hormonal influences on your mood.

## 2024-03-23 ENCOUNTER — Ambulatory Visit: Payer: Self-pay | Admitting: Internal Medicine

## 2024-03-23 LAB — CBC
Hematocrit: 44.7 % (ref 34.0–46.6)
Hemoglobin: 14.1 g/dL (ref 11.1–15.9)
MCH: 30.6 pg (ref 26.6–33.0)
MCHC: 31.5 g/dL (ref 31.5–35.7)
MCV: 97 fL (ref 79–97)
Platelets: 209 x10E3/uL (ref 150–450)
RBC: 4.61 x10E6/uL (ref 3.77–5.28)
RDW: 12.5 % (ref 11.7–15.4)
WBC: 8.7 x10E3/uL (ref 3.4–10.8)

## 2024-03-23 LAB — LIPID PANEL
Chol/HDL Ratio: 5 ratio — ABNORMAL HIGH (ref 0.0–4.4)
Cholesterol, Total: 225 mg/dL — ABNORMAL HIGH (ref 100–199)
HDL: 45 mg/dL (ref >39)
LDL Chol Calc (NIH): 166 mg/dL — ABNORMAL HIGH (ref 0–99)
Triglycerides: 79 mg/dL (ref 0–149)
VLDL Cholesterol Cal: 14 mg/dL (ref 5–40)

## 2024-03-23 LAB — COMPREHENSIVE METABOLIC PANEL WITH GFR
ALT: 9 IU/L (ref 0–32)
AST: 12 IU/L (ref 0–40)
Albumin: 4.4 g/dL (ref 3.9–4.9)
Alkaline Phosphatase: 116 IU/L (ref 44–121)
BUN/Creatinine Ratio: 11 (ref 9–23)
BUN: 13 mg/dL (ref 6–20)
Bilirubin Total: 0.3 mg/dL (ref 0.0–1.2)
CO2: 20 mmol/L (ref 20–29)
Calcium: 9.7 mg/dL (ref 8.7–10.2)
Chloride: 108 mmol/L — ABNORMAL HIGH (ref 96–106)
Creatinine, Ser: 1.14 mg/dL — ABNORMAL HIGH (ref 0.57–1.00)
Globulin, Total: 2.4 g/dL (ref 1.5–4.5)
Glucose: 76 mg/dL (ref 70–99)
Potassium: 4.4 mmol/L (ref 3.5–5.2)
Sodium: 141 mmol/L (ref 134–144)
Total Protein: 6.8 g/dL (ref 6.0–8.5)
eGFR: 64 mL/min/1.73 (ref >59)

## 2024-03-25 DIAGNOSIS — N200 Calculus of kidney: Secondary | ICD-10-CM | POA: Diagnosis not present

## 2024-03-25 DIAGNOSIS — R3914 Feeling of incomplete bladder emptying: Secondary | ICD-10-CM | POA: Diagnosis not present

## 2024-03-25 DIAGNOSIS — S3729XS Other injury of bladder, sequela: Secondary | ICD-10-CM | POA: Diagnosis not present

## 2024-04-01 DIAGNOSIS — M25571 Pain in right ankle and joints of right foot: Secondary | ICD-10-CM | POA: Diagnosis not present

## 2024-04-06 DIAGNOSIS — F4310 Post-traumatic stress disorder, unspecified: Secondary | ICD-10-CM | POA: Diagnosis not present

## 2024-04-06 DIAGNOSIS — F322 Major depressive disorder, single episode, severe without psychotic features: Secondary | ICD-10-CM | POA: Diagnosis not present

## 2024-04-06 DIAGNOSIS — F411 Generalized anxiety disorder: Secondary | ICD-10-CM | POA: Diagnosis not present

## 2024-04-08 ENCOUNTER — Other Ambulatory Visit: Payer: Self-pay

## 2024-04-08 ENCOUNTER — Ambulatory Visit (HOSPITAL_BASED_OUTPATIENT_CLINIC_OR_DEPARTMENT_OTHER): Attending: Physical Medicine and Rehabilitation | Admitting: Physical Therapy

## 2024-04-08 ENCOUNTER — Encounter (HOSPITAL_BASED_OUTPATIENT_CLINIC_OR_DEPARTMENT_OTHER): Payer: Self-pay | Admitting: Physical Therapy

## 2024-04-08 DIAGNOSIS — M5459 Other low back pain: Secondary | ICD-10-CM | POA: Insufficient documentation

## 2024-04-08 DIAGNOSIS — M6281 Muscle weakness (generalized): Secondary | ICD-10-CM | POA: Insufficient documentation

## 2024-04-08 DIAGNOSIS — R2681 Unsteadiness on feet: Secondary | ICD-10-CM | POA: Insufficient documentation

## 2024-04-08 DIAGNOSIS — M797 Fibromyalgia: Secondary | ICD-10-CM | POA: Insufficient documentation

## 2024-04-08 NOTE — Therapy (Signed)
 OUTPATIENT PHYSICAL THERAPY THORACOLUMBAR EVALUATION   Patient Name: Laurie Casey MRN: [id number] DOB:05-Nov-1985, 38 y.o., female Today's Date: 04/08/2024  END OF SESSION:  PT End of Session - 04/08/24 1250     Visit Number 1    Number of Visits 12    Date for Recertification  06/18/24    Authorization Type [employer] med[employer]    PT Start Time 0932    PT Stop Time 1012    PT Time Calculation (min) 40 min    Activity Tolerance Patient tolerated treatment well    Behavior During Therapy [facility] for tasks assessed/performed          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   Pre-eclampsia    PTSD (post-traumatic stress disorder) 06/30/2022   Past Surgical History:  Procedure Laterality Date   CESAREAN SECTION  2013, 2015, 2019   x2   CESAREAN SECTION  03/24/2023   with partial historectomy with uterus, cervix and tube removal   CYSTOSCOPY  2008 or 2009   CYSTOSCOPY W/ URETERAL STENT PLACEMENT Right 11/29/2020   Procedure: CYSTOSCOPY WITH RETROGRADE PYELOGRAM/URETERAL STENT PLACEMENT;  Surgeon: Matilda Senior, MD;  Location: [facility] OR;  Service: Urology;  Laterality: Right;   CYSTOSCOPY/URETEROSCOPY/HOLMIUM LASER/STENT PLACEMENT Right 12/13/2020   Procedure: CYSTOSCOPY RIGHT URETEROSCOPY/HOLMIUM LASER/STENT EXTRACTION AND RIGHT JJ STENT PLACEMENT, RIGHT RETROGRADE URETEROSCOPY;  Surgeon: Matilda Senior, MD;  Location: [facility];  Service: Urology;  Laterality: Right;   URETER SURGERY     x2   WISDOM TOOTH EXTRACTION     Patient Active Problem List   Diagnosis Date Noted   Placenta accreta 12/03/2022   History of cesarean section 10/01/2022   Essential hypertension 08/11/2022   Obesity (BMI  30.0-34.9) 08/11/2022   High risk multigravida in third trimester 09/10/2017   Hx of preeclampsia, prior pregnancy, currently pregnant 03/16/2014    PCP: Barnie Louder MD  REFERRING PROVIDER:   Lorilee Sven SQUIBB, MD    REFERRING DIAG: M79.7 (ICD-10-CM) - Fibromyalgia   Rationale for Evaluation and Treatment: Rehabilitation  THERAPY DIAG:  Other low back pain  Muscle weakness (generalized)  Unsteadiness on feet  ONSET DATE: chronic  SUBJECTIVE:  SUBJECTIVE STATEMENT: Fall in June hurt left ankle.  Having a hard time healing.  Ortho put me on a heavy boot for 4 months and it made my back hurt and made ankle worse.  I am in constant pain with my fibro.  Worst in my back.  Get trigger point injection in my back, a lot of joint pain. It feels hot.  Hard to explain. Have a 1 yr old/4 children. We are selling and buying a house. (External stress). Pain interrupts sleep >3 x night. Had abdominal surgery in Sept 2024 and have no core strength  PERTINENT HISTORY:  fibromyalgia  PAIN:  Are you having pain? Yes: NPRS scale: current 5/10; worst 7; least 3/10 Pain location: joints including spine.  LBP > Pain description: hot; restless Aggravating factors: stress Relieving factors: meds; heat; TENs unit, pressure point mat  PRECAUTIONS: None  RED FLAGS: None   WEIGHT BEARING RESTRICTIONS: No  FALLS:  Has patient fallen in last 6 months? Yes 2: fell down stairs; fell in shower  LIVING ENVIRONMENT: Lives with: lives with their family Lives in: House/apartment Stairs: No Has following equipment at home: None  OCCUPATION: stay at home Mom  PLOF: Independent  PATIENT GOALS: increase toleration to movement; get back to exercise  NEXT MD VISIT: intermittent  OBJECTIVE:  Note: Objective  measures were completed at Evaluation unless otherwise noted.   PATIENT SURVEYS:  ODI  COGNITION: Overall cognitive status: Within functional limits for tasks assessed       POSTURE: increased thoracic kyphosis/ hypermobile   LUMBAR ROM:   hypermobile  LOWER EXTREMITY ROM:     Active  Right eval Left eval  Hip flexion    Hip extension    Hip abduction    Hip adduction    Hip internal rotation    Hip external rotation    Knee flexion    Knee extension hyper hyper  Ankle dorsiflexion    Ankle plantarflexion    Ankle inversion    Ankle eversion     (Blank rows = not tested)  LOWER EXTREMITY MMT:     Tested in sitting HD lbs Right eval Left eval  Hip flexion 29.1 21.9  Hip extension    Hip abduction 18.5 17.1  Hip adduction    Hip internal rotation    Hip external rotation    Knee flexion    Knee extension 12.8 22.0  Ankle dorsiflexion    Ankle plantarflexion    Ankle inversion    Ankle eversion     (Blank rows = not tested)   FUNCTIONAL TESTS:  Timed up and go (TUG): 14.17   4 stage balance GAIT: Distance walked: 400 ft Assistive device utilized: None Level of assistance: Complete Independence Comments: antalgic upon initiation of gait  TREATMENT  Eval Self care:Posture and body mechanic instruction; use of ad; hypermobility  PATIENT EDUCATION:  Education details: Discussed eval findings, rehab rationale, aquatic program progression/POC and pools in area. Patient is in agreement  Person educated: Patient Education method: Explanation Education comprehension: verbalized understanding  HOME EXERCISE PROGRAM: Aquatic tba  ASSESSMENT:  CLINICAL IMPRESSION: Patient is a 38 y.o. f who was seen today for physical therapy evaluation and treatment for fibromyalgia. Patient presents with pain limited deficits throughout  extremities and axial spine effecting endurance, activity tolerance, gait, balance, and functional mobility with ADL's. She is having to modify and restrict ADL's as per subjective information and objective measures as well as outcome measure score (ODI 30/50=60%).  She will benefit from skilled PT intervention in aquatics using the properties of water  to improve tolerance to movement building strength and reducing pain  OBJECTIVE IMPAIRMENTS: decreased activity tolerance, decreased balance, decreased strength, and pain.   ACTIVITY LIMITATIONS: carrying, lifting, bending, sitting, standing, squatting, stairs, transfers, locomotion level, and caring for others  PARTICIPATION LIMITATIONS: meal prep, cleaning, shopping, community activity, occupation, and yard work  PERSONAL FACTORS: Age and Past/current experiences are also affecting patient's functional outcome.   REHAB POTENTIAL: Good  CLINICAL DECISION MAKING: Stable/uncomplicated  EVALUATION COMPLEXITY: Low   GOALS: Goals reviewed with patient? No  SHORT TERM GOALS: Target date: 10/30  Pt will tolerate full aquatic sessions consistently without increase in pain and with improving function to demonstrate good toleration and effectiveness of intervention.  Baseline: Goal status: INITIAL  2.  Pt will report a reduction in pain while submerged by at least 50% to demonstrate using the properties of water  for pain management Baseline:  Goal status: INITIAL    LONG TERM GOALS: Target date: 12/6  Pt to improve on ODI by 13% to demonstrate statistically significant Improvement in function. (MCID 13-15%) Baseline: 30/50=60% Goal status: INITIAL  2.  Pt will report decrease in pain by at least 50% for improved toleration to activity/quality of life and to demonstrate improved management of pain. Baseline: see chart Goal status: INITIAL  3.  Pt will improve strength in hips and knees within 5 lbs of contralateral side to demonstrate  improved overall physical function Baseline: see chart Goal status: INITIAL  4.  Pt will report improved toleration to activity by 50% Baseline: see chart Goal status: INITIAL  5.  Pt will consider gaining pool access for use of the properties of water  for chronic conditions maintaining mobility and minimizing pain. Baseline: none Goal status: INITIAL    PLAN:  PT FREQUENCY: 1-2x/week  PT DURATION: 10 weeks extended due to scheduling conflicts  PLANNED INTERVENTIONS: 97164- PT Re-evaluation, 97110-Therapeutic exercises, 97530- Therapeutic activity, 97112- Neuromuscular re-education, 97535- Self Care, 02859- Manual therapy, [phone]- Gait training, Patient/Family education, Balance training, Stair training, Taping, Joint mobilization, and DME instructions.  PLAN FOR NEXT SESSION: aquatic: general strengthening (avoid excessive stretching); balance retraining; pain management   Ronal Foots) Truong Delcastillo MPT 04/08/24 12:56 PM [facility] [address] [place], [place], [address] Phone: [phone]   Fax:  [phone]  For all possible CPT codes, reference the Planned Interventions line above.     Check all conditions that are expected to impact treatment: {Conditions expected to impact treatment:Musculoskeletal disorders   If treatment provided at initial evaluation, no treatment charged due to lack of authorization.

## 2024-04-13 ENCOUNTER — Encounter: Payer: Self-pay | Admitting: Internal Medicine

## 2024-04-15 ENCOUNTER — Other Ambulatory Visit: Payer: Self-pay

## 2024-04-15 ENCOUNTER — Ambulatory Visit: Payer: Self-pay

## 2024-04-15 ENCOUNTER — Ambulatory Visit
Admission: RE | Admit: 2024-04-15 | Discharge: 2024-04-15 | Disposition: A | Discharge status: Home / Self Care | Attending: Family Medicine | Admitting: Family Medicine

## 2024-04-15 VITALS — BP 130/93 | HR 106 | Temp 97.8°F | Resp 19 | Ht 64.0 in | Wt 160.0 lb

## 2024-04-15 DIAGNOSIS — R000 Tachycardia, unspecified: Secondary | ICD-10-CM | POA: Diagnosis not present

## 2024-04-15 DIAGNOSIS — R42 Dizziness and giddiness: Secondary | ICD-10-CM | POA: Insufficient documentation

## 2024-04-15 LAB — GLUCOSE, POCT (MANUAL RESULT ENTRY): POCT Glucose (KUC): 150 mg/dL — AB (ref 70–99)

## 2024-04-15 LAB — POCT URINE DIPSTICK
Blood, UA: NEGATIVE
Glucose, UA: NEGATIVE mg/dL
Ketones, POC UA: NEGATIVE mg/dL
Nitrite, UA: NEGATIVE
Spec Grav, UA: >=1.03 — AB (ref 1.010–1.025)
Urobilinogen, UA: 1 U/dL
pH, UA: 6 (ref 5.0–8.0)

## 2024-04-15 LAB — POCT URINE PREGNANCY: Preg Test, Ur: NEGATIVE

## 2024-04-15 NOTE — ED Notes (Signed)
 EKG handed off to MD.

## 2024-04-15 NOTE — Telephone Encounter (Signed)
 noted

## 2024-04-15 NOTE — ED Provider Notes (Signed)
 [facility]    CSN: [id number] Arrival date & time: 04/15/24  1449      History   Chief Complaint Chief Complaint  Patient presents with   Dizziness    Hx of fibromyalgia, last 4 days dizzy while sitting up then gets worse when standing, heart feels like its going to beat out of my chest. No chest pain or tingling. - Entered by patient    HPI Laurie Casey is a 38 y.o. female.    Dizziness Associated symptoms: palpitations   Patient is here for dizziness x 4 days.   It is light headedness, not spinning per se.   It is constant when walking.  It is better when she is sitting with her feet up and laying back.  She also feels that her heart is beating fast, beating out of her chest when walking.  No chest pain per.  Her fibro pain is worse at this moment, but that is not abnormal for her.  Mild nausea, but no vomiting.  Eating/drinking normally.  No diarrhea or constipation.  No urinary symptoms.  Never happened before.  She had a baby 9/24;  s/p total hysterectomy.  No bleeding.  She just had a full panel of blood work done last month.  All okay.  No formal dx of POTS.  She is walking with a cane today.  She states she does this at times in terms of her fibromyalgia, but today also b/c of the dizziness.        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   Pre-eclampsia    PTSD (post-traumatic stress disorder) 06/30/2022    Patient Active Problem List   Diagnosis Date Noted   Placenta accreta 12/03/2022   History of cesarean section 10/01/2022   Essential hypertension 08/11/2022   Obesity (BMI 30.0-34.9) 08/11/2022   High risk multigravida in third trimester 09/10/2017   Hx  of preeclampsia, prior pregnancy, currently pregnant 03/16/2014    Past Surgical History:  Procedure Laterality Date   CESAREAN SECTION  2013, 2015, 2019   x2   CESAREAN SECTION  03/24/2023   with partial historectomy with uterus, cervix and tube removal   CYSTOSCOPY  2008 or 2009   CYSTOSCOPY W/ URETERAL STENT PLACEMENT Right 11/29/2020   Procedure: CYSTOSCOPY WITH RETROGRADE PYELOGRAM/URETERAL STENT PLACEMENT;  Surgeon: Matilda Senior, MD;  Location: [facility] OR;  Service: Urology;  Laterality: Right;   CYSTOSCOPY/URETEROSCOPY/HOLMIUM LASER/STENT PLACEMENT Right 12/13/2020   Procedure: CYSTOSCOPY RIGHT URETEROSCOPY/HOLMIUM LASER/STENT EXTRACTION AND RIGHT JJ STENT PLACEMENT, RIGHT RETROGRADE URETEROSCOPY;  Surgeon: Matilda Senior, MD;  Location: [facility];  Service: Urology;  Laterality: Right;   URETER SURGERY     x2   WISDOM TOOTH EXTRACTION      OB History     Gravida  11   Para  3   Term  2   Preterm  1   AB  7   Living  3      SAB  7   IAB      Ectopic      Multiple      Live Births  3  Home Medications    Prior to Admission medications   Medication Sig Start Date End Date Taking? Authorizing Provider  amLODipine  (NORVASC ) 10 MG tablet Take 1 tablet (10 mg total) by mouth daily. 02/24/24   Vicci Barnie NOVAK, MD  buPROPion (WELLBUTRIN XL) 300 MG 24 hr tablet Take 300 mg by mouth every morning. 05/25/22   [provider]  DULoxetine (CYMBALTA) 30 MG capsule Take 30 mg by mouth 2 (two) times daily.    [provider]  EPINEPHrine  0.3 mg/0.3 mL IJ SOAJ injection Inject 0.3 mg into the muscle as needed for anaphylaxis. 11/20/23   Vicci Barnie NOVAK, MD  famotidine  (PEPCID ) 20 MG tablet Take 1 tablet (20 mg total) by mouth 2 (two) times daily as needed for heartburn or indigestion. 11/13/23   Lorilee Sven SQUIBB, MD  Multiple Vitamin (MULTIVITAMIN) tablet Take 1 tablet by mouth daily.    [provider]  prazosin  (MINIPRESS )  1 MG capsule Take 1 capsule (1 mg total) by mouth at bedtime. 01/14/24   Raulkar, Sven SQUIBB, MD  pregabalin  (LYRICA ) 50 MG capsule Take 1 capsule (50 mg total) by mouth at bedtime. 01/14/24   Raulkar, Sven SQUIBB, MD  REXULTI 1 MG TABS tablet Take 1 mg by mouth at bedtime. 03/03/24   [provider]  tiZANidine  (ZANAFLEX ) 2 MG tablet Take 1 tablet (2 mg total) by mouth 2 (two) times daily as needed for muscle spasms. 02/24/24   Vicci Barnie NOVAK, MD  topiramate  (TOPAMAX ) 25 MG tablet Take 1 tablet (25 mg total) by mouth 3 (three) times daily as needed. 01/05/24   Raulkar, Sven SQUIBB, MD  XANAX  0.25 MG tablet Take 0.25 mg by mouth as needed. 07/15/23   [provider]    Family History Family History  Problem Relation Age of Onset   Hypertension Father     Social History Social History   Tobacco Use   Smoking status: Former    Current packs/day: 0.00    Types: Cigarettes    Quit date: 10/12/2020    Years since quitting: 3.5   Smokeless tobacco: Never   Tobacco comments:    off and on   Vaping Use   Vaping status: Never Used  Substance Use Topics   Alcohol use: Not Currently   Drug use: Never     Allergies   Tape   Review of Systems Review of Systems  Constitutional: Negative.   HENT: Negative.    Respiratory: Negative.    Cardiovascular:  Positive for palpitations.  Gastrointestinal: Negative.   Genitourinary: Negative.   Musculoskeletal: Negative.   Neurological:  Positive for dizziness.  Psychiatric/Behavioral: Negative.       Physical Exam Triage Vital Signs ED Triage Vitals  Encounter Vitals Group     BP 04/15/24 1455 (!) 130/93     Girls Systolic BP Percentile --      Girls Diastolic BP Percentile --      Boys Systolic BP Percentile --      Boys Diastolic BP Percentile --      Pulse Rate 04/15/24 1455 (!) 106     Resp 04/15/24 1455 19     Temp 04/15/24 1455 97.8 F (36.6 C)     Temp Source 04/15/24 1455 Oral     SpO2 04/15/24 1455 97 %      Weight 04/15/24 1455 160 lb (72.6 kg)     Height 04/15/24 1455 5' 4 (1.626 m)     Head Circumference --  Peak Flow --      Pain Score 04/15/24 1506 6     Pain Loc --      Pain Education --      Exclude from Growth Chart --    Orthostatic VS for the past 24 hrs:  BP- Lying Pulse- Lying BP- Sitting Pulse- Sitting BP- Standing at 0 minutes Pulse- Standing at 0 minutes  04/15/24 1512 111/77 109 118/82 114 113/78 117    Updated Vital Signs BP (!) 130/93 (BP Location: Right Arm)   Pulse (!) 106   Temp 97.8 F (36.6 C) (Oral)   Resp 19   Ht 5' 4 (1.626 m)   Wt 72.6 kg   SpO2 97%   Breastfeeding No   BMI 27.46 kg/m   Visual Acuity Right Eye Distance:   Left Eye Distance:   Bilateral Distance:    Right Eye Near:   Left Eye Near:    Bilateral Near:     Physical Exam HENT:     Head: Normocephalic and atraumatic.     Mouth/Throat:     Mouth: Mucous membranes are moist.  Cardiovascular:     Rate and Rhythm: Regular rhythm. Tachycardia present.  Pulmonary:     Effort: Pulmonary effort is normal.     Breath sounds: Normal breath sounds. No wheezing or rhonchi.  Abdominal:     Palpations: Abdomen is soft.     Tenderness: There is no abdominal tenderness. There is no guarding or rebound.  Musculoskeletal:        General: Normal range of motion.     Cervical back: Normal range of motion and neck supple.  Skin:    General: Skin is warm.  Neurological:     General: No focal deficit present.     Mental Status: She is alert and oriented to person, place, and time.  Psychiatric:        Mood and Affect: Mood normal.      UC Treatments / Results  Labs (all labs ordered are listed, but only abnormal results are displayed) Labs Reviewed  GLUCOSE, POCT (MANUAL RESULT ENTRY) - Abnormal; Notable for the following components:      Result Value   POCT Glucose ([facility]) 150 (*)    All other components within normal limits  POCT URINE DIPSTICK - Abnormal; Notable for the  following components:   Clarity, UA cloudy (*)    Bilirubin, UA small (*)    Spec Grav, UA >=1.030 (*)    Leukocytes, UA Trace (*)    All other components within normal limits  TSH  POCT URINE PREGNANCY   UPT negative  EKG sinus tachycardia; rate 101; otherwise normal EKG  Radiology No results found.  Procedures Procedures (including critical care time)  Medications Ordered in UC Medications - No data to display  Initial Impression / Assessment and Plan / UC Course  I have reviewed the triage vital signs and the nursing notes.  Pertinent labs & imaging results that were available during my care of the patient were reviewed by me and considered in my medical decision making (see chart for details).   Final Clinical Impressions(s) / UC Diagnoses   Final diagnoses:  Dizziness  Tachycardia     Discharge Instructions      You were seen today for dizziness.  As discussed, your blood sugar, urine and EKG overall looked okay.  Your heart rate is a bit elevated, but not concerning.  I have ordered a thyroid level today.  I recommend you get rest over the weekend, and increase fluid/electrolyte intake.  Please follow up with your primary care provider for further care and discussion.  However, If you have worsening symptoms, or you are not improving the next 24-48 hrs then please go to the ER for further evaluation.     ED Prescriptions   None    PDMP not reviewed this encounter.   Darral Longs, MD 04/15/24 [phone]

## 2024-04-15 NOTE — Telephone Encounter (Signed)
 FYI Only or Action Required?: FYI only for provider.  Patient was last seen in primary care on 03/22/2024 by Vicci Barnie NOVAK, MD.  Called Nurse Triage reporting Dizziness and Tachycardia.  Symptoms began several days ago.  Interventions attempted: Rest, hydration, or home remedies.  Symptoms are: unchanged.  Triage Disposition: See HCP Within 4 Hours (Or PCP Triage)  Patient/caregiver understands and will follow disposition?: Yes     Copied from CRM #[id number]. Topic: Clinical - Red Word Triage >> Apr 15, 2024 12:22 PM Wess RAMAN wrote: Red Word that prompted transfer to Nurse Triage: Dizzy when sitting up. Worse when she stands up and feels if she will pass out and having difficulty breathing and feels like her heart will beat out her chest.   pulse is 80 when laying down 140 when standing  Patient was diagnosed with Fibromyalgia a year and a half ago. Reason for Disposition  [1] Dizziness caused by heat exposure, sudden standing, or poor fluid intake AND [2] no improvement after 2 hours of rest and fluids  Answer Assessment - Initial Assessment Questions Additional info: 1) Patient is concerned for POTS. Messaged with clinical staff 04/13/24 who advised mobile medicine clinic or uc. She did not go and was waiting to see if symptoms resolved.  2) Offered next available acute visit today but she refused due to needing to pick up her children. Advised urgent care, she is in agreement and will have her partner drive her.     1. DESCRIPTION: Describe your dizziness.     Lightheaded  2. LIGHTHEADED: Do you feel lightheaded? (e.g., somewhat faint, woozy, weak upon standing)     Somewhat weak and moderate dizziness 3. VERTIGO: Do you feel like either you or the room is spinning or tilting? (i.e., vertigo)     no 4. SEVERITY: How bad is it?  Do you feel like you are going to faint? Can you stand and walk?     Afraid to fall 5. ONSET:  When did the dizziness  begin?     4 days 6. AGGRAVATING FACTORS: Does anything make it worse? (e.g., standing, change in head position)     Sitting and standing  7. HEART RATE: Can you tell me your heart rate? How many beats in 15 seconds?  (Note: Not all patients can do this.)       Laying down 80, when standing 140 8. CAUSE: What do you think is causing the dizziness? (e.g., decreased fluids or food, diarrhea, emotional distress, heat exposure, new medicine, sudden standing, vomiting; unknown)     unsure 9. RECURRENT SYMPTOM: Have you had dizziness before? If Yes, ask: When was the last time? What happened that time?     Long time ago but not this bad 10. OTHER SYMPTOMS: Do you have any other symptoms? (e.g., fever, chest pain, vomiting, diarrhea, bleeding)       Shortness of breath with increased heart rate.  Protocols used: Dizziness - Lightheadedness-A-AH

## 2024-04-15 NOTE — ED Triage Notes (Signed)
 Pt presents with a chief complaint of dizziness x 4 days. Hx of fibromyalgia. Feels like herself when she is lying flat. However when sitting and/or standing, pt begins to have symptoms of dizziness and feeling like she is going to pass out. Pt states  I feel like my heart is beating out of my chest when sitting or standing. Normal appetite. Increased fatigue.

## 2024-04-15 NOTE — Discharge Instructions (Signed)
 You were seen today for dizziness.  As discussed, your blood sugar, urine and EKG overall looked okay.  Your heart rate is a bit elevated, but not concerning.  I have ordered a thyroid level today.  I recommend you get rest over the weekend, and increase fluid/electrolyte intake.  Please follow up with your primary care provider for further care and discussion.  However, If you have worsening symptoms, or you are not improving the next 24-48 hrs then please go to the ER for further evaluation.

## 2024-04-16 LAB — TSH: TSH: 1.58 u[IU]/mL (ref 0.450–4.500)

## 2024-04-18 ENCOUNTER — Emergency Department (HOSPITAL_BASED_OUTPATIENT_CLINIC_OR_DEPARTMENT_OTHER)
Admission: EM | Admit: 2024-04-18 | Discharge: 2024-04-18 | Disposition: A | Discharge status: Home / Self Care | Attending: Emergency Medicine | Admitting: Emergency Medicine

## 2024-04-18 ENCOUNTER — Emergency Department (HOSPITAL_BASED_OUTPATIENT_CLINIC_OR_DEPARTMENT_OTHER)

## 2024-04-18 ENCOUNTER — Other Ambulatory Visit (HOSPITAL_BASED_OUTPATIENT_CLINIC_OR_DEPARTMENT_OTHER): Payer: Self-pay

## 2024-04-18 ENCOUNTER — Other Ambulatory Visit: Payer: Self-pay

## 2024-04-18 ENCOUNTER — Ambulatory Visit (HOSPITAL_COMMUNITY): Payer: Self-pay

## 2024-04-18 ENCOUNTER — Encounter (HOSPITAL_BASED_OUTPATIENT_CLINIC_OR_DEPARTMENT_OTHER): Payer: Self-pay | Admitting: Emergency Medicine

## 2024-04-18 DIAGNOSIS — N200 Calculus of kidney: Secondary | ICD-10-CM

## 2024-04-18 DIAGNOSIS — R10A2 Flank pain, left side: Secondary | ICD-10-CM | POA: Diagnosis present

## 2024-04-18 DIAGNOSIS — N132 Hydronephrosis with renal and ureteral calculous obstruction: Secondary | ICD-10-CM | POA: Insufficient documentation

## 2024-04-18 LAB — COMPREHENSIVE METABOLIC PANEL WITH GFR
ALT: 6 U/L (ref 0–44)
AST: 14 U/L — ABNORMAL LOW (ref 15–41)
Albumin: 3.9 g/dL (ref 3.5–5.0)
Alkaline Phosphatase: 99 U/L (ref 38–126)
Anion gap: 10 (ref 5–15)
BUN: 14 mg/dL (ref 6–20)
CO2: 21 mmol/L — ABNORMAL LOW (ref 22–32)
Calcium: 9.5 mg/dL (ref 8.9–10.3)
Chloride: 109 mmol/L (ref 98–111)
Creatinine, Ser: 1.14 mg/dL — ABNORMAL HIGH (ref 0.44–1.00)
GFR, Estimated: >60 mL/min (ref >60)
Glucose, Bld: 110 mg/dL — ABNORMAL HIGH (ref 70–99)
Potassium: 3.9 mmol/L (ref 3.5–5.1)
Sodium: 140 mmol/L (ref 135–145)
Total Bilirubin: <0.2 mg/dL (ref 0.0–1.2)
Total Protein: 6.3 g/dL — ABNORMAL LOW (ref 6.5–8.1)

## 2024-04-18 LAB — URINALYSIS, ROUTINE W REFLEX MICROSCOPIC
Bilirubin Urine: NEGATIVE
Glucose, UA: NEGATIVE mg/dL
Ketones, ur: NEGATIVE mg/dL
Leukocytes,Ua: NEGATIVE
Nitrite: NEGATIVE
Protein, ur: NEGATIVE mg/dL
Specific Gravity, Urine: 1.015 (ref 1.005–1.030)
pH: 6 (ref 5.0–8.0)

## 2024-04-18 LAB — CBC WITH DIFFERENTIAL/PLATELET
Abs Immature Granulocytes: 0.02 K/uL (ref 0.00–0.07)
Basophils Absolute: 0.1 K/uL (ref 0.0–0.1)
Basophils Relative: 1 %
Eosinophils Absolute: 0.4 K/uL (ref 0.0–0.5)
Eosinophils Relative: 4 %
HCT: 38.7 % (ref 36.0–46.0)
Hemoglobin: 12.8 g/dL (ref 12.0–15.0)
Immature Granulocytes: 0 %
Lymphocytes Relative: 21 %
Lymphs Abs: 2.1 K/uL (ref 0.7–4.0)
MCH: 31 pg (ref 26.0–34.0)
MCHC: 33.1 g/dL (ref 30.0–36.0)
MCV: 93.7 fL (ref 80.0–100.0)
Monocytes Absolute: 1.1 K/uL — ABNORMAL HIGH (ref 0.1–1.0)
Monocytes Relative: 10 %
Neutro Abs: 6.6 K/uL (ref 1.7–7.7)
Neutrophils Relative %: 64 %
Platelets: 171 K/uL (ref 150–400)
RBC: 4.13 MIL/uL (ref 3.87–5.11)
RDW: 12.9 % (ref 11.5–15.5)
WBC: 10.3 K/uL (ref 4.0–10.5)
nRBC: 0 % (ref 0.0–0.2)

## 2024-04-18 LAB — PREGNANCY, URINE: Preg Test, Ur: NEGATIVE

## 2024-04-18 LAB — HCG, SERUM, QUALITATIVE: Preg, Serum: NEGATIVE

## 2024-04-18 LAB — LIPASE, BLOOD: Lipase: 27 U/L (ref 11–51)

## 2024-04-18 LAB — URINE CULTURE: Culture: NO GROWTH

## 2024-04-18 MED ORDER — SODIUM CHLORIDE 0.9 % IV BOLUS
1000.0000 mL | Freq: Once | INTRAVENOUS | Status: AC
  Administered 2024-04-18: 1000 mL via INTRAVENOUS

## 2024-04-18 MED ORDER — KETOROLAC TROMETHAMINE 30 MG/ML IJ SOLN
30.0000 mg | Freq: Once | INTRAMUSCULAR | Status: AC
  Administered 2024-04-18: 30 mg via INTRAVENOUS
  Filled 2024-04-18: qty 1

## 2024-04-18 MED ORDER — OXYCODONE HCL 5 MG PO TABS
5.0000 mg | ORAL_TABLET | Freq: Four times a day (QID) | ORAL | 0 refills | Status: DC | PRN
  Filled 2024-04-18: qty 10, 3d supply, fill #0

## 2024-04-18 MED ORDER — HYDROMORPHONE HCL 1 MG/ML IJ SOLN
1.0000 mg | Freq: Once | INTRAMUSCULAR | Status: AC
  Administered 2024-04-18: 1 mg via INTRAVENOUS
  Filled 2024-04-18: qty 1

## 2024-04-18 MED ORDER — ONDANSETRON HCL 4 MG PO TABS
4.0000 mg | ORAL_TABLET | Freq: Four times a day (QID) | ORAL | 0 refills | Status: DC
  Filled 2024-04-18: qty 12, 3d supply, fill #0

## 2024-04-18 MED ORDER — ONDANSETRON HCL 4 MG/2ML IJ SOLN
4.0000 mg | Freq: Once | INTRAMUSCULAR | Status: AC
Start: 2024-04-18 — End: 2024-04-18
  Administered 2024-04-18: 4 mg via INTRAVENOUS
  Filled 2024-04-18: qty 2

## 2024-04-18 MED ORDER — TAMSULOSIN HCL 0.4 MG PO CAPS
0.4000 mg | ORAL_CAPSULE | Freq: Every day | ORAL | 0 refills | Status: AC
  Filled 2024-04-18: qty 7, 7d supply, fill #0

## 2024-04-18 NOTE — ED Notes (Signed)
 ED Provider at bedside.

## 2024-04-18 NOTE — ED Triage Notes (Signed)
 Pt caox4, ambulatory c/o L flank pain since approx 4 this morning. PMH kidney stones. Last took advil  around 0530 this morning.

## 2024-04-18 NOTE — ED Provider Notes (Signed)
  EMERGENCY DEPARTMENT AT [facility] Provider Note   CSN: [id number] Arrival date & time: 04/18/24  0700     Patient presents with: Flank Pain   Laurie Casey is a 38 y.o. female.   Patient here with left flank pain that started a couple hours ago.  History of has any fever or chills.  Denies any hematuria or pain with urination.  Denies any chest pain shortness of breath.  She has felt nauseous.  Nothing makes it worse or better.  She took ibuprofen  earlier this morning.  Denies any headache.  The history is provided by the patient.       Prior to Admission medications   Medication Sig Start Date End Date Taking? Authorizing Provider  ondansetron  (ZOFRAN ) 4 MG tablet Take 1 tablet (4 mg total) by mouth every 6 (six) hours. 04/18/24  Yes Pharoah Goggins, DO  oxyCODONE  (ROXICODONE ) 5 MG immediate release tablet Take 1 tablet (5 mg total) by mouth every 6 (six) hours as needed for up to 10 doses. 04/18/24  Yes Shiven Junious, DO  tamsulosin (FLOMAX) 0.4 MG CAPS capsule Take 1 capsule (0.4 mg total) by mouth daily for 7 days. 04/18/24 04/25/24 Yes Christella App, DO  amLODipine  (NORVASC ) 10 MG tablet Take 1 tablet (10 mg total) by mouth daily. 02/24/24   Vicci Barnie NOVAK, MD  buPROPion (WELLBUTRIN XL) 300 MG 24 hr tablet Take 300 mg by mouth every morning. 05/25/22   [provider]  DULoxetine (CYMBALTA) 30 MG capsule Take 30 mg by mouth 2 (two) times daily.    [provider]  EPINEPHrine  0.3 mg/0.3 mL IJ SOAJ injection Inject 0.3 mg into the muscle as needed for anaphylaxis. 11/20/23   Vicci Barnie NOVAK, MD  famotidine  (PEPCID ) 20 MG tablet Take 1 tablet (20 mg total) by mouth 2 (two) times daily as needed for heartburn or indigestion. 11/13/23   Lorilee Sven SQUIBB, MD  Multiple Vitamin (MULTIVITAMIN) tablet Take 1 tablet by mouth daily.    [provider]  prazosin  (MINIPRESS ) 1 MG capsule Take 1 capsule (1 mg total) by mouth at bedtime.  01/14/24   Raulkar, Sven SQUIBB, MD  pregabalin  (LYRICA ) 50 MG capsule Take 1 capsule (50 mg total) by mouth at bedtime. 01/14/24   Raulkar, Sven SQUIBB, MD  REXULTI 1 MG TABS tablet Take 1 mg by mouth at bedtime. 03/03/24   [provider]  tiZANidine  (ZANAFLEX ) 2 MG tablet Take 1 tablet (2 mg total) by mouth 2 (two) times daily as needed for muscle spasms. 02/24/24   Vicci Barnie NOVAK, MD  topiramate  (TOPAMAX ) 25 MG tablet Take 1 tablet (25 mg total) by mouth 3 (three) times daily as needed. 01/05/24   Raulkar, Sven SQUIBB, MD  XANAX  0.25 MG tablet Take 0.25 mg by mouth as needed. 07/15/23   [provider]    Allergies: Tape    Review of Systems  Updated Vital Signs BP (!) 138/93   Pulse 88   Temp 97.7 F (36.5 C) (Oral)   Resp 20   Ht 5' 5 (1.651 m)   Wt 74.8 kg   SpO2 100%   BMI 27.46 kg/m   Physical Exam Vitals and nursing note reviewed.  Constitutional:      General: She is not in acute distress.    Appearance: She is well-developed. She is not ill-appearing.  HENT:     Head: Normocephalic and atraumatic.     Nose: Nose normal.  Mouth/Throat:     Mouth: Mucous membranes are moist.  Eyes:     Extraocular Movements: Extraocular movements intact.     Conjunctiva/sclera: Conjunctivae normal.     Pupils: Pupils are equal, round, and reactive to light.  Cardiovascular:     Rate and Rhythm: Normal rate and regular rhythm.     Pulses: Normal pulses.     Heart sounds: Normal heart sounds. No murmur heard. Pulmonary:     Effort: Pulmonary effort is normal. No respiratory distress.     Breath sounds: Normal breath sounds.  Abdominal:     Palpations: Abdomen is soft.     Tenderness: There is no abdominal tenderness. There is left CVA tenderness.  Musculoskeletal:        General: No swelling.     Cervical back: Normal range of motion and neck supple.  Skin:    General: Skin is warm and dry.     Capillary Refill: Capillary refill takes less than 2 seconds.   Neurological:     General: No focal deficit present.     Mental Status: She is alert.  Psychiatric:        Mood and Affect: Mood normal.     (all labs ordered are listed, but only abnormal results are displayed) Labs Reviewed  URINALYSIS, ROUTINE W REFLEX MICROSCOPIC - Abnormal; Notable for the following components:      Result Value   Hgb urine dipstick MODERATE (*)    Bacteria, UA RARE (*)    All other components within normal limits  CBC WITH DIFFERENTIAL/PLATELET - Abnormal; Notable for the following components:   Monocytes Absolute 1.1 (*)    All other components within normal limits  COMPREHENSIVE METABOLIC PANEL WITH GFR - Abnormal; Notable for the following components:   CO2 21 (*)    Glucose, Bld 110 (*)    Creatinine, Ser 1.14 (*)    Total Protein 6.3 (*)    AST 14 (*)    All other components within normal limits  PREGNANCY, URINE  LIPASE, BLOOD  HCG, SERUM, QUALITATIVE    EKG: None  Radiology: CT Renal Stone Study Result Date: 04/18/2024 CLINICAL DATA:  Left flank pain since 4 a.m. EXAM: CT ABDOMEN AND PELVIS WITHOUT CONTRAST TECHNIQUE: Multidetector CT imaging of the abdomen and pelvis was performed following the standard protocol without IV contrast. RADIATION DOSE REDUCTION: This exam was performed according to the departmental dose-optimization program which includes automated exposure control, adjustment of the mA and/or kV according to patient size and/or use of iterative reconstruction technique. COMPARISON:  07/17/2022. FINDINGS: Lower chest: Minimal dependent atelectasis. Heart size normal. No pericardial effusion. No pleural effusion. Distal esophagus is grossly unremarkable. Hepatobiliary: Liver and gallbladder are unremarkable. No biliary ductal dilatation. Pancreas: Negative. Spleen: Negative. Adrenals/Urinary Tract: Adrenal glands are unremarkable. Scarring and mild atrophy of the right kidney. Small low-attenuation lesion in the upper pole right kidney,  too small to characterize and unchanged. No specific follow-up necessary. Tiny bilateral renal stones. Right ureter is decompressed. Mild to moderate left hydronephrosis secondary to a 5 mm stone at the left ureteral orifice. Bladder is otherwise grossly unremarkable. Stomach/Bowel: Stomach, small bowel, appendix and colon are unremarkable. Vascular/Lymphatic: Vascular structures are unremarkable. No pathologically enlarged lymph nodes. Reproductive: Hysterectomy.  No adnexal mass. Other: No free fluid.  Mesenteries and peritoneum are unremarkable. Musculoskeletal: None. IMPRESSION: 1. Mild to moderate left hydronephrosis secondary to a 5 mm stone at the left ureteral orifice. 2. Tiny bilateral renal stones. Electronically Signed  By: Newell Eke M.D.   On: 04/18/2024 10:31     Procedures   Medications Ordered in the ED  ketorolac  (TORADOL ) 30 MG/ML injection 30 mg (has no administration in time range)  sodium chloride  0.9 % bolus 1,000 mL (0 mLs Intravenous Stopped 04/18/24 0904)  ondansetron  (ZOFRAN ) injection 4 mg (4 mg Intravenous Given 04/18/24 0754)  HYDROmorphone (DILAUDID) injection 1 mg (1 mg Intravenous Given 04/18/24 0754)  HYDROmorphone (DILAUDID) injection 1 mg (1 mg Intravenous Given 04/18/24 0911)                                    Medical Decision Making Amount and/or Complexity of Data Reviewed Labs: ordered. Radiology: ordered.  Risk Prescription drug management.   JACQUESE CASSARINO is here with left flank pain.  History of kidney stones.  Differential diagnosis kidney stone versus pyelonephritis versus musculoskeletal process.  This seems less likely to be pelvic in origin or cardiac in origin.  Will get CBC CMP lipase CT renal stone study, urinalysis and urinary pregnancy test.  Will give IV fluids IV Zofran  IV Dilaudid and reevaluate.  Per my review and interpretation of labs and images.  Patient does have a left-sided kidney stone.  However no UTI.  No white count.   No fever.  Pregnancy test negative.  Overall workup with uncomplicated kidney stone.  She is feeling much better after multiple rounds of IV antiemetics and pain medicine and fluids.  Will prescribe oxycodone  and Flomax and Zofran .  Recommend Tylenol  and ibuprofen .  She already follows with urology.  Understands return precautions including fever and uncontrollable pain and nausea.  Patient discharged in good condition.  This chart was dictated using voice recognition software.  Despite best efforts to proofread,  errors can occur which can change the documentation meaning.      Final diagnoses:  Kidney stone    ED Discharge Orders          Ordered    oxyCODONE  (ROXICODONE ) 5 MG immediate release tablet  Every 6 hours PRN        04/18/24 1040    tamsulosin (FLOMAX) 0.4 MG CAPS capsule  Daily        04/18/24 1040    ondansetron  (ZOFRAN ) 4 MG tablet  Every 6 hours        04/18/24 1040               Sabriya Yono, DO 04/18/24 1051

## 2024-04-18 NOTE — ED Notes (Signed)
 Patient transported to CT

## 2024-04-18 NOTE — Discharge Instructions (Signed)
 Follow-up with your urology team.  Take Roxicodone , this is a narcotic pain medicine for breakthrough pain.  This medication is sedating so please be careful with its use.  Recommend 1000 mg of Tylenol  every 6 hours as needed for pain.  Recommend 400 mg ibuprofen  every 8 hours as needed for pain.  Take Flomax as prescribed.  Take Zofran  as needed for nausea and vomiting.  Please return if you develop fever greater than 100.4 with the pain or if you develop uncontrollable pain and nausea despite the medicine at home.

## 2024-04-20 ENCOUNTER — Ambulatory Visit: Payer: Self-pay

## 2024-04-20 NOTE — Telephone Encounter (Signed)
 FYI Only or Action Required?: FYI only for provider.  Patient was last seen in primary care on 03/22/2024 by Vicci Barnie NOVAK, MD.  Called Nurse Triage reporting Palpitations and Fatigue.  Symptoms began 1.5 weeks ago.  Interventions attempted: Other: seen in urgent care on 04/15/24.  Symptoms are: palpitations tachycardia with exertion; weakness/fatigue stable at rest.  Triage Disposition: See PCP When Office is Open (Within 3 Days)  Patient/caregiver understands and will follow disposition?: Yes             Message from Zy'onna H sent at 04/20/2024 10:24 AM EDT  Reason for Triage: **Transf. to NT**  --- Because I can't find any earlier openings for the patient to be seen - Appt's beginning Nov.6 (patient would like to be seen sooner)  Urgent Care F/U Patient originally went to Urgent Care: 04/15/2024 **Initially for dizziness** Patient is complaining still of the following symptoms: Can't walk around/be on feet very long until she feel slike she's going to pass out - No dizziness (No room/head spinning) - Strong Heart Palpations - Unable to carry her son/heavy lifting   Please give patient a call back ASAP   Reason for Disposition  [1] Palpitations AND [2] no improvement after using Care Advice  Answer Assessment - Initial Assessment Questions 1. DESCRIPTION: Please describe your heart rate or heartbeat that you are having (e.g., fast/slow, regular/irregular, skipped or extra beats, palpitations)     Tachycardia, beating really fast  2. ONSET: When did it start? (e.g., minutes, hours, days)      1.5 weeks.  3. DURATION: How long does it last (e.g., seconds, minutes, hours)     She states it is constant when she exerts herself. She states it subsided once she rests and catches her breath.  4. PATTERN Does it come and go, or has it been constant since it started?  Does it get worse with exertion?   Are you feeling it now?     Comes and goes,  occurs with exertion. Not feeling it now while she is sitting/at rest.  5. TAP: Using your hand, can you tap out what you are feeling on a chair or table in front of you, so that I can hear? Note: Not all patients can do this.       N/A.  6. HEART RATE: Can you tell me your heart rate? How many beats in 15 seconds?  Note: Not all patients can do this.       N/A.  7. RECURRENT SYMPTOM: Have you ever had this before? If Yes, ask: When was the last time? and What happened that time?      Yes, about 15 years ago. She states she went to the ED. She states at that time if she stood up she would pass out. She was told it was anxiety and was placed on Klonopin. She states she didn't follow up on it because she thought it was just related to her being young and working 2 jobs at the time.   8. CAUSE: What do you think is causing the palpitations?     She thinks it might be POTS. She states she has researched it and feels she could have it.  9. CARDIAC HISTORY: Do you have any history of heart disease? (e.g., heart attack, angina, bypass surgery, angioplasty, arrhythmia)      No.  10. OTHER SYMPTOMS: Do you have any other symptoms? (e.g., dizziness, chest pain, sweating, difficulty breathing)  She states she has weakness/fatigue more so than dizziness; she states walking in her home or carrying her 67 year old son makes her feel very tired/exhausted. Denies pain, numbness/tingling, unilateral weakness or numbness, chest pain, difficulty breathing, sweating.  11. PREGNANCY: Is there any chance you are pregnant? When was your last menstrual period?       Hysterectomy.  Protocols used: Heart Rate and Heartbeat Questions-A-AH

## 2024-04-21 ENCOUNTER — Encounter: Payer: Self-pay | Admitting: Internal Medicine

## 2024-04-21 ENCOUNTER — Ambulatory Visit: Attending: Internal Medicine | Admitting: Internal Medicine

## 2024-04-21 VITALS — BP 97/67 | HR 107 | Ht 65.0 in | Wt 168.0 lb

## 2024-04-21 DIAGNOSIS — I951 Orthostatic hypotension: Secondary | ICD-10-CM

## 2024-04-21 DIAGNOSIS — N1330 Unspecified hydronephrosis: Secondary | ICD-10-CM

## 2024-04-21 DIAGNOSIS — R002 Palpitations: Secondary | ICD-10-CM | POA: Diagnosis not present

## 2024-04-21 DIAGNOSIS — N200 Calculus of kidney: Secondary | ICD-10-CM | POA: Diagnosis not present

## 2024-04-21 DIAGNOSIS — R5383 Other fatigue: Secondary | ICD-10-CM

## 2024-04-21 NOTE — Progress Notes (Signed)
 Cardiology Office Note:    Date:  04/26/2024   ID:  Carlyon LOISE Marina, DOB 1986/03/27, MRN [id number]  PCP:  Vicci Barnie NOVAK, MD   [facility] Providers Cardiologist:  Dub Huntsman, DO Cardiology APP:  Madie Jon Garre, PA     Referring MD: Vicci Barnie NOVAK, MD   Chief Complaint  Patient presents with   New Patient (Initial Visit)  Orthostatic tachycardia without hypotension  History of Present Illness:    Laurie Casey is a 38 y.o. female with a hx of prior tobacco use, anxiety, migraines, preeclampsia, fibromaylagia, PTSD/GAD/MDD.  She was referred to cardiology to evaluation palpitations, tachycardia with exertion, and lightheadedness x 10 days. Unable to complete daily tasks.   She reported that she feels woozy and heart racing after standing or walking short distances. HR at home increased form 80 bpm lying down to 137 bpm upon standing. This coincided with a significant increase in diastolic BP when standing.   She presents today to establish with cardiology.   She reports 2 weeks ago she developed woozy feeling when she sits up and feels tired. She also has SOB. Symptoms are worse in the morning. She also feels tachycardia with sitting up which is worse with standing. She feels that her BP drops and her hear rate increases.   She was completing an evaluation to participate in aqua-therapy and felt poorly. First occurrence was [birth year] (her [birth year]). She has DOE with minimal exertion (such as walking to the room in our office).   Recently wellbutrin was switched from 300 mg daily to 200 mg BID - this started on Sept 12/13th.   She lives at home with her partner and 1 yo son. She does not smoke cigarettes (former smoker quit 3-4 years ago). She does not work outside the home, quit job in March due to fibromyalgia - worked as a Museum/gallery conservator. She attempted a desk job but being stationary was worse for fibromyalgia. No THC, no illicit drugs. She is attempting  to get disability.   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   Pre-eclampsia    PTSD (post-traumatic stress disorder) 06/30/2022    Past Surgical History:  Procedure Laterality Date   CESAREAN SECTION  2013, 2015, 2019   x2   CESAREAN SECTION  03/24/2023   with partial historectomy with uterus, cervix and tube removal   CYSTOSCOPY  2008 or 2009   CYSTOSCOPY W/ URETERAL STENT PLACEMENT Right 11/29/2020   Procedure: CYSTOSCOPY WITH RETROGRADE PYELOGRAM/URETERAL STENT PLACEMENT;  Surgeon: Matilda Senior, MD;  Location: [facility] OR;  Service: Urology;  Laterality: Right;   CYSTOSCOPY/URETEROSCOPY/HOLMIUM LASER/STENT PLACEMENT Right 12/13/2020   Procedure: CYSTOSCOPY RIGHT URETEROSCOPY/HOLMIUM LASER/STENT EXTRACTION AND RIGHT JJ STENT PLACEMENT, RIGHT RETROGRADE URETEROSCOPY;  Surgeon: Matilda Senior, MD;  Location: [facility] ORS;  Service: Urology;  Laterality: Right;   URETER SURGERY     x2   WISDOM TOOTH EXTRACTION      Current Medications: Current Meds  Medication Sig   amLODipine  (NORVASC ) 10 MG tablet Take 1 tablet (10 mg total) by mouth daily.   buPROPion (WELLBUTRIN XL) 300 MG 24 hr tablet Take 300 mg by mouth every morning. (Patient taking  differently: Take 200 mg by mouth in the morning and at bedtime.)   DULoxetine (CYMBALTA) 30 MG capsule Take 30 mg by mouth 2 (two) times daily.   EPINEPHrine  0.3 mg/0.3 mL IJ SOAJ injection Inject 0.3 mg into the muscle as needed for anaphylaxis.   famotidine  (PEPCID ) 20 MG tablet Take 1 tablet (20 mg total) by mouth 2 (two) times daily as needed for heartburn or indigestion.   Multiple Vitamin (MULTIVITAMIN) tablet Take 1 tablet by mouth daily.   prazosin  (MINIPRESS )  1 MG capsule Take 1 capsule (1 mg total) by mouth at bedtime.   pregabalin  (LYRICA ) 50 MG capsule Take 1 capsule (50 mg total) by mouth at bedtime.   tamsulosin (FLOMAX) 0.4 MG CAPS capsule Take 1 capsule (0.4 mg total) by mouth daily for 7 days.   tiZANidine  (ZANAFLEX ) 2 MG tablet Take 1 tablet (2 mg total) by mouth 2 (two) times daily as needed for muscle spasms.   topiramate  (TOPAMAX ) 25 MG tablet Take 1 tablet (25 mg total) by mouth 3 (three) times daily as needed.   XANAX  0.25 MG tablet Take 0.25 mg by mouth as needed.     Allergies:   Tape   Social History   Socioeconomic History   Marital status: Significant Other    Spouse name: Not on file   Number of children: 3   Years of education: Not on file   Highest education level: Not on file  Occupational History   Occupation: Vet tech  Tobacco Use   Smoking status: Former    Current packs/day: 0.00    Types: Cigarettes    Quit date: 10/12/2020    Years since quitting: 3.5   Smokeless tobacco: Never   Tobacco comments:    off and on   Vaping Use   Vaping status: Never Used  Substance and Sexual Activity   Alcohol use: Not Currently   Drug use: Never   Sexual activity: Not on file  Other Topics Concern   Not on file  Social History Narrative   Not on file   Social Drivers of Health   Financial Resource Strain: Low Risk  (07/23/2023)   Overall Financial Resource Strain (CARDIA)    Difficulty of Paying Living Expenses: Not hard at all  Food Insecurity: No Food Insecurity (07/23/2023)   Hunger Vital Sign    Worried About Running Out of Food in the Last Year: Never true    Ran Out of Food in the Last Year: Never true  Transportation Needs: No Transportation Needs (07/23/2023)   PRAPARE - Administrator, Civil Service (Medical): No    Lack of Transportation (Non-Medical): No  Physical Activity: Sufficiently Active (07/23/2023)   Exercise Vital Sign    Days of Exercise per Week: 5 days    Minutes of Exercise per  Session: 40 min  Stress: Stress Concern Present (07/23/2023)   [employer] of Occupational Health - Occupational Stress Questionnaire    Feeling of Stress : Very much  Social Connections: Socially Isolated (07/23/2023)   Social Connection and Isolation Panel    Frequency of Communication with Friends and Family: Never    Frequency of Social Gatherings with Friends and Family: Never    Attends Religious Services: Never    Database administrator or Organizations: No    Attends Engineer, structural: Never    Marital Status: Living with partner     Family History: The patient's family history includes Hypertension in her father.  ROS:   Please see the history of present illness.     All other systems reviewed and are negative.  EKGs/Labs/Other Studies Reviewed:    The following studies were reviewed today:  EKG Interpretation Date/Time:  Tuesday April 26 2024 08:56:48 EDT Ventricular Rate:  98 PR Interval:  144 QRS Duration:  76 QT Interval:  346 QTC Calculation: 441 R Axis:   27  Text Interpretation: Normal sinus rhythm Nonspecific ST and T wave abnormality When compared with ECG of 15-Apr-2024 15:22, No significant change was found Confirmed by Madie Slough ([id number]) on 04/26/2024 9:08:51 AM    Recent Labs: 04/15/2024: TSH 1.580 04/18/2024: ALT 6; BUN 14; Creatinine, Ser 1.14; Potassium 3.9; Sodium 140 04/21/2024: Hemoglobin 13.4; Platelets 179  Recent Lipid Panel    Component Value Date/Time   CHOL 225 (H) 03/22/2024 1225   TRIG 79 03/22/2024 1225   HDL 45 03/22/2024 1225   CHOLHDL 5.0 (H) 03/22/2024 1225   LDLCALC 166 (H) 03/22/2024 1225     Risk Assessment/Calculations:          Physical Exam:    VS:  BP 108/68   Pulse 98   Ht 5' 5 (1.651 m)   Wt 168 lb 6.4 oz (76.4 kg)   SpO2 98%   BMI 28.02 kg/m     Wt Readings from Last 3 Encounters:  04/26/24 168 lb 6.4 oz (76.4 kg)  04/21/24 168 lb (76.2 kg)  04/18/24 165 lb (74.8 kg)     GEN:   Well nourished, well developed in no acute distress HEENT: Normal NECK: No JVD; No carotid bruits LYMPHATICS: No lymphadenopathy CARDIAC: RRR, no murmurs, rubs, gallops RESPIRATORY:  Clear to auscultation without rales, wheezing or rhonchi  ABDOMEN: Soft, non-tender, non-distended MUSCULOSKELETAL:  No edema; No deformity  SKIN: Warm and dry NEUROLOGIC:  Alert and oriented x 3 PSYCHIATRIC:  Normal affect   ASSESSMENT:    1. Essential hypertension   2. POTS (postural orthostatic tachycardia syndrome)   3. Fibromyalgia   4. Bilateral lower extremity edema   5. Hyperlipidemia with target LDL less than 70    PLAN:    In order of problems listed above:  Palpitations, heart racing, dizziness with position changes Suspect dysautonomia - orthostatic vitals showed:   BP HR   supine 121/81 83   sitting 128/86 91   standing 119/83 110   Standing 5 min 128/89 116   Standing 10 min 124/90 130     - recommend supine exercises, increased oral hydration, continued compression stockings,  - will defer heart monitor for now - CBC and iron panel checked on 04/21/24 by PCP, she is on supplemental iron - TSH WNL recently checked - will provide POTS education including supine exercises recommended   Hypertension - she is currently on 10 mg amlodipine  - she reports elevated DBP at 105 with symptoms  - no medication changes for now, continue to monitor   Anxiety - currently on cymbalta, minipress , topamax , wellbutrin   Hyperlipidemia LDL was 166 Will recheck in 4-6 months, may need to start low dose statin   Follow up with Dr. Sheena in 4 months.               Medication Adjustments/Labs and Tests Ordered: Current medicines are reviewed at length with the patient today.  Concerns regarding medicines are outlined above.  Orders Placed This Encounter  Procedures   EKG 12-Lead   No orders of the defined types were placed in this encounter.  Patient Instructions       Dysautonomia / Postural Orthostatic Tachycardia Syndrome: Spent >20 minutes counseling specifically on the etiology, diagnosis, and symptom management of POTS. The following recommendations were emphasized:  -avoid dehydration. Often it requires high volumes of fluids, often with salt/electrolytes included, to stay hydrated. People with POTS are very sensitive to fluid shifts and dehydration. Oral rehydration is preferred, and routine use of IV fluids is not recommended. -if tolerated, compression stocking can assist with fluid management and prevent pooling in the legs. -slow position changes are recommended -if there is a feeling of severe lightheadedness, like near to passing out, recommend lying on the floor on the back, with legs elevated up on a chair or up against the wall. -the best long term management of POTS symptoms is gradual exercise conditioning. I recommend seated exercises such as bike to start, to avoid the risk of falling with lightheadedness. Exercise programs, either through supervised programs like cardiac rehab or through personal programs, should focus on gradually increasing exercise tolerance and conditioning.  -this is a link to specific exercise recommendations for POTS:  http://peterson-powell.net/  -we discussed the typical spectrum of dysautonomia, including typical populations, that this sometimes spontaneously improves with age (though a small percentage have persistent symptoms), that this has uncomfortable symptoms but is not associated with long term mortality, and that the etiology/treatment of this is an area of active research    Signed, Jon Nat Hails, [place]  04/26/2024 9:58 AM    [facility]

## 2024-04-21 NOTE — Patient Instructions (Signed)
  VISIT SUMMARY: Today, you were seen for dizziness and palpitations that have been occurring for about four days. You described feeling 'woozy' and like your heart is going to 'beat out of my chest' after standing or walking short distances. These symptoms are relieved by sitting or lying down. You also noted significant changes in your heart rate and blood pressure when changing positions. We discussed your history of kidney stones and chronic kidney disease.  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 We will refer you to a cardiologist for further evaluation. In the meantime, continue to stay hydrated, wear compression socks when you are up and active, and change positions slowly.  -NEPHROLITHIASIS, LEFT URETER (RECENTLY RESOLVED): You recently had a kidney stone that seems to have resolved. Continue taking Tamsulosin for another month to help with any remaining stone passage. Follow up with your urologist if you have any trouble passing the stone.  -CHRONIC NEPHROLITHIASIS (KIDNEY STONES): You have a history of frequent kidney stones. The medication Topamax , which you take for pain management, can increase the risk of kidney stones. We will monitor the frequency of your kidney stones and discuss the possibility of discontinuing Topamax  if they become more frequent.   INSTRUCTIONS: You will be referred to a cardiologist for further evaluation of suspected POTS. Continue taking Tamsulosin for another month and follow up with your urologist if you have trouble passing the kidney stone. Regularly monitor the frequency of your kidney stones and discuss with your doctor if they become more frequent. Continue regular follow-ups with your urologist to monitor your kidney  function.                      Contains text generated by [employer]bridge.                                 Contains text generated by Abridge.

## 2024-04-21 NOTE — Progress Notes (Signed)
 Patient ID: Laurie Casey, female    DOB: 12/31/1985  MRN: [id number]  CC: Palpitations (Palpitations & tachycardia with exertion, lightheadedness X10 days approx/Episode of doing laundry drained patient energy for the rest of the day - unable to complete daily tasks /)   Subjective: Laurie Casey is a 38 y.o. female who presents for chronic ds management. Her concerns today include:  Patient with history of asthma, former smoker, anxiety disorder, migraines, preeclampsia, ureteral stone, fibromyalgia, PTSD/GAD/MDD (Followed by psychiatrist Prentice Orn, MD and therapist Nia at [facility]).   Discussed the use of AI scribe software for clinical note transcription with the patient, who gave verbal consent to proceed.  History of Present Illness Laurie Casey is a 38 year old female who presents with dizziness and palpitations.  She has been experiencing palpitations and some dizziness which began approximately a week and a half ago. The sensation is described as if her heart is going to 'beat out of my chest' after standing or walking short distances. This is associated with a woozy feeling. These symptoms are relieved by sitting or lying down.  She attempted to perform orthostatic blood pressure measurements at home, noting a significant increase in heart rate from 79-80 bpm while lying down to 137 bpm upon standing. She also observed a significant increase in her diastolic blood pressure when standing, reaching almost 100 mmHg. At home, her blood pressure is normal when lying down but shows increased diastolic pressure when standing.  She has been staying hydrated, drinking at least 80 ounces of water  daily and consuming fruits like grapes and cherries. She does not have menses as she had a hysterectomy. She denies any recent changes in medication, except for recent addition of Flomax from ER visit on 04/18/2024 for a kidney stone episode. Symptoms had started  prior to Flomax.   Seen in UC 04/15/2024 for dizziness and palpitations.  Found to be tachycardic with pulse rate of 106.  EKG showed sinus tachycardia with heart rate of 101 TSH was normal.  Patient will to stay hydrated and follow-up with PCP. No blood in stools or urine, shortness of breath, significant recent travel, or leg swelling. No blood in stools or urine, shortness of breath, significant recent travel, or leg swelling. CBC on recent ER visit showed h/h 12.8/38.7 down from 14.1/44.7 a mth earlier. I do not know whether she had already received IV fluids prior to the draw. Pt thinks blood was drawn prior to IV fluids.  She has a history of kidney stones. Seen in the ER with left flank pain.  He showed a 5mm LT ureteral stone with mild to moderate hydronephrosis.  She was hydrated and sent home on Flomax and Oxycodone .  Told to follow-up with urology. Reports symptoms have resolve and she has not had to use the Oxycodone . Thinks she may have pass the stone.  She states that she called her urologist Dr. Carolynn at [facility] u[facility].  She states that he saw the renal CT report and states to wait and see if she passes the stone. Had last seen him about 3 wks prior. Sees him Q 6 mths every since bladder was injured at the time of hysterectomy last year.     Patient Active Problem List   Diagnosis Date Noted   Placenta accreta 12/03/2022   History of cesarean section 10/01/2022   Essential hypertension 08/11/2022   Obesity (BMI 30.0-34.9) 08/11/2022   High risk multigravida in third trimester  09/10/2017   Hx of preeclampsia, prior pregnancy, currently pregnant 03/16/2014     Current Outpatient Medications on File Prior to Visit  Medication Sig Dispense Refill   amLODipine  (NORVASC ) 10 MG tablet Take 1 tablet (10 mg total) by mouth daily. 90 tablet 1   buPROPion (WELLBUTRIN XL) 300 MG 24 hr tablet Take 300 mg by mouth every morning.     DULoxetine (CYMBALTA) 30 MG capsule Take 30 mg by mouth 2  (two) times daily.     EPINEPHrine  0.3 mg/0.3 mL IJ SOAJ injection Inject 0.3 mg into the muscle as needed for anaphylaxis. 1 each 1   famotidine  (PEPCID ) 20 MG tablet Take 1 tablet (20 mg total) by mouth 2 (two) times daily as needed for heartburn or indigestion. 90 tablet 3   Multiple Vitamin (MULTIVITAMIN) tablet Take 1 tablet by mouth daily.     prazosin  (MINIPRESS ) 1 MG capsule Take 1 capsule (1 mg total) by mouth at bedtime. 90 capsule 3   pregabalin  (LYRICA ) 50 MG capsule Take 1 capsule (50 mg total) by mouth at bedtime. 90 capsule 3   tamsulosin (FLOMAX) 0.4 MG CAPS capsule Take 1 capsule (0.4 mg total) by mouth daily for 7 days. 7 capsule 0   tiZANidine  (ZANAFLEX ) 2 MG tablet Take 1 tablet (2 mg total) by mouth 2 (two) times daily as needed for muscle spasms. 30 tablet 4   topiramate  (TOPAMAX ) 25 MG tablet Take 1 tablet (25 mg total) by mouth 3 (three) times daily as needed. 270 tablet 3   XANAX  0.25 MG tablet Take 0.25 mg by mouth as needed.     No current facility-administered medications on file prior to visit.    Allergies  Allergen Reactions   Tape Rash    Adhesive Develop SOB, hives and itching when exposed to adhesive glue see note 11/2023    Social History   Socioeconomic History   Marital status: Significant Other    Spouse name: Not on file   Number of children: 3   Years of education: Not on file   Highest education level: Not on file  Occupational History   Occupation: Vet tech  Tobacco Use   Smoking status: Former    Current packs/day: 0.00    Types: Cigarettes    Quit date: 10/12/2020    Years since quitting: 3.5   Smokeless tobacco: Never   Tobacco comments:    off and on   Vaping Use   Vaping status: Never Used  Substance and Sexual Activity   Alcohol use: Not Currently   Drug use: Never   Sexual activity: Not on file  Other Topics Concern   Not on file  Social History Narrative   Not on file   Social Drivers of Health   Financial Resource  Strain: Low Risk  (07/23/2023)   Overall Financial Resource Strain (CARDIA)    Difficulty of Paying Living Expenses: Not hard at all  Food Insecurity: No Food Insecurity (07/23/2023)   Hunger Vital Sign    Worried About Running Out of Food in the Last Year: Never true    Ran Out of Food in the Last Year: Never true  Transportation Needs: No Transportation Needs (07/23/2023)   PRAPARE - Administrator, Civil Service (Medical): No    Lack of Transportation (Non-Medical): No  Physical Activity: Sufficiently Active (07/23/2023)   Exercise Vital Sign    Days of Exercise per Week: 5 days    Minutes of Exercise per Session:  40 min  Stress: Stress Concern Present (07/23/2023)   [employer] of Occupational Health - Occupational Stress Questionnaire    Feeling of Stress : Very much  Social Connections: Socially Isolated (07/23/2023)   Social Connection and Isolation Panel    Frequency of Communication with Friends and Family: Never    Frequency of Social Gatherings with Friends and Family: Never    Attends Religious Services: Never    Database administrator or Organizations: No    Attends Banker Meetings: Never    Marital Status: Living with partner  Intimate Partner Violence: Not At Risk (07/23/2023)   Humiliation, Afraid, Rape, and Kick questionnaire    Fear of Current or Ex-Partner: No    Emotionally Abused: No    Physically Abused: No    Sexually Abused: No    Family History  Problem Relation Age of Onset   Hypertension Father     Past Surgical History:  Procedure Laterality Date   CESAREAN SECTION  2013, 2015, 2019   x2   CESAREAN SECTION  03/24/2023   with partial historectomy with uterus, cervix and tube removal   CYSTOSCOPY  2008 or 2009   CYSTOSCOPY W/ URETERAL STENT PLACEMENT Right 11/29/2020   Procedure: CYSTOSCOPY WITH RETROGRADE PYELOGRAM/URETERAL STENT PLACEMENT;  Surgeon: Matilda Senior, MD;  Location: [facility] OR;  Service: Urology;  Laterality:  Right;   CYSTOSCOPY/URETEROSCOPY/HOLMIUM LASER/STENT PLACEMENT Right 12/13/2020   Procedure: CYSTOSCOPY RIGHT URETEROSCOPY/HOLMIUM LASER/STENT EXTRACTION AND RIGHT JJ STENT PLACEMENT, RIGHT RETROGRADE URETEROSCOPY;  Surgeon: Matilda Senior, MD;  Location: [facility];  Service: Urology;  Laterality: Right;   URETER SURGERY     x2   WISDOM TOOTH EXTRACTION      ROS: Review of Systems Negative except as stated above  PHYSICAL EXAM: BP 97/67 (BP Location: Left Arm, Patient Position: Standing)   Pulse (!) 107   Ht 5' 5 (1.651 m)   Wt 168 lb (76.2 kg)   SpO2 100%   BMI 27.96 kg/m   Physical Exam Sitting: BP 117/85, P 90 Standing: BP 109/77, P 112 General appearance - alert, well appearing, and in no distress Mental status - normal mood, behavior, speech, dress, motor activity, and thought processes Mouth - mucous membranes moist, pharynx normal without lesions Neck - supple, no significant adenopathy Chest - clear to auscultation, no wheezes, rales or rhonchi, symmetric air entry Heart - normal rate, regular rhythm, normal S1, S2, no murmurs, rubs, clicks or gallops Neurological - actively moves all 4s.       Latest Ref Rng & Units 04/18/2024    7:27 AM 03/22/2024   12:25 PM 10/01/2022   10:04 AM  CMP  Glucose 70 - 99 mg/dL 889  76  88   BUN 6 - 20 mg/dL 14  13  11    Creatinine 0.44 - 1.00 mg/dL 8.85  8.85  9.27   Sodium 135 - 145 mmol/L 140  141  137   Potassium 3.5 - 5.1 mmol/L 3.9  4.4  4.2   Chloride 98 - 111 mmol/L 109  108  106   CO2 22 - 32 mmol/L 21  20  16    Calcium 8.9 - 10.3 mg/dL 9.5  9.7  9.6   Total Protein 6.5 - 8.1 g/dL 6.3  6.8  6.7   Total Bilirubin 0.0 - 1.2 mg/dL <9.7  0.3  0.3   Alkaline Phos 38 - 126 U/L 99  116  74   AST 15 - 41 U/L 14  12  23   ALT 0 - 44 U/L 6  9  16     Lipid Panel     Component Value Date/Time   CHOL 225 (H) 03/22/2024 1225   TRIG 79 03/22/2024 1225   HDL 45 03/22/2024 1225   CHOLHDL 5.0 (H) 03/22/2024 1225   LDLCALC 166 (H)  03/22/2024 1225    CBC    Component Value Date/Time   WBC 10.3 04/18/2024 0727   RBC 4.13 04/18/2024 0727   HGB 12.8 04/18/2024 0727   HGB 14.1 03/22/2024 1225   HCT 38.7 04/18/2024 0727   HCT 44.7 03/22/2024 1225   PLT 171 04/18/2024 0727   PLT 209 03/22/2024 1225   MCV 93.7 04/18/2024 0727   MCV 97 03/22/2024 1225   MCH 31.0 04/18/2024 0727   MCHC 33.1 04/18/2024 0727   RDW 12.9 04/18/2024 0727   RDW 12.5 03/22/2024 1225   LYMPHSABS 2.1 04/18/2024 0727   LYMPHSABS 2.4 10/01/2022 1004   MONOABS 1.1 (H) 04/18/2024 0727   EOSABS 0.4 04/18/2024 0727   EOSABS 0.3 10/01/2022 1004   BASOSABS 0.1 04/18/2024 0727   BASOSABS 0.1 10/01/2022 1004   Lab Results  Component Value Date   TSH 1.580 04/15/2024     ASSESSMENT AND PLAN: 1. Heart palpitations (Primary) Patient orthostatic based on pulse. Symptomatic despite staying well-hydrated. DDX includes POTS, anemia. Will refer to cardiology  - Ambulatory referral to Cardiology  2. Orthostasis Advised to stay hydrated Go slow with position changes Wear compression shoes that come up to the knees all day until bedtime. She has a pair at home - Ambulatory referral to Cardiology - CBC  3. Nephrolithiasis Likely has pass stone that was in LT ureter since symptoms resolve and not having no dysuria.  Advised that Topamax  increases the risk of forming kidney stones as it is a carbonic anhydrase inhibitor which decreases urinary citrate excretion and increases in urine pH.  States that she is aware and already had this discussion with her urologist and pain management specialist.  She had history of kidney stones prior placed on Topamax .  She states that her urologist told her to stay hydrated and they will watch it for now.  Finds the Topamax  very helpful for fibromyalgia.  I advised that if she starts having increasing episodes urolithiasis, she should consider coming off the Topamax .   4. Hydronephrosis, left Continue Tamsulosin  until she completes current rxn. Pt reports renal CT was reviewed by her urologist  5. Other fatigue We will recheck CBC and iron studies today. - CBC - Iron, TIBC and Ferritin Panel   Patient was given the opportunity to ask questions.  Patient verbalized understanding of the plan and was able to repeat key elements of the plan.   This documentation was completed using Paediatric nurse.  Any transcriptional errors are unintentional.  Orders Placed This Encounter  Procedures   CBC   Iron, TIBC and Ferritin Panel   Ambulatory referral to Cardiology     Requested Prescriptions    No prescriptions requested or ordered in this encounter    No follow-ups on file.  Barnie Louder, MD, FACP

## 2024-04-22 ENCOUNTER — Ambulatory Visit: Payer: Self-pay | Admitting: Internal Medicine

## 2024-04-22 LAB — CBC
Hematocrit: 40.9 % (ref 34.0–46.6)
Hemoglobin: 13.4 g/dL (ref 11.1–15.9)
MCH: 31.5 pg (ref 26.6–33.0)
MCHC: 32.8 g/dL (ref 31.5–35.7)
MCV: 96 fL (ref 79–97)
Platelets: 179 x10E3/uL (ref 150–450)
RBC: 4.26 x10E6/uL (ref 3.77–5.28)
RDW: 12.4 % (ref 11.7–15.4)
WBC: 8.2 x10E3/uL (ref 3.4–10.8)

## 2024-04-22 LAB — IRON,TIBC AND FERRITIN PANEL
Ferritin: 55 ng/mL (ref 15–150)
Iron Saturation: 19 % (ref 15–55)
Iron: 48 ug/dL (ref 27–159)
Total Iron Binding Capacity: 254 ug/dL (ref 250–450)
UIBC: 206 ug/dL (ref 131–425)

## 2024-04-26 ENCOUNTER — Ambulatory Visit: Attending: Physician Assistant | Admitting: Physician Assistant

## 2024-04-26 ENCOUNTER — Encounter: Payer: Self-pay | Admitting: Physician Assistant

## 2024-04-26 VITALS — BP 108/68 | HR 98 | Ht 65.0 in | Wt 168.4 lb

## 2024-04-26 DIAGNOSIS — R600 Localized edema: Secondary | ICD-10-CM | POA: Insufficient documentation

## 2024-04-26 DIAGNOSIS — M797 Fibromyalgia: Secondary | ICD-10-CM | POA: Diagnosis not present

## 2024-04-26 DIAGNOSIS — G90A Postural orthostatic tachycardia syndrome (POTS): Secondary | ICD-10-CM | POA: Insufficient documentation

## 2024-04-26 DIAGNOSIS — I10 Essential (primary) hypertension: Secondary | ICD-10-CM | POA: Insufficient documentation

## 2024-04-26 DIAGNOSIS — E785 Hyperlipidemia, unspecified: Secondary | ICD-10-CM | POA: Diagnosis not present

## 2024-04-26 NOTE — Patient Instructions (Signed)
 Medication Instructions:  NO CHANGES  Lab Work: FASTING LIPID PANEL TO BE DONE IN 4 MONTHS AT NEXT OFFICE VISIT WITH DR. KARDIE TOBB.  Testing/Procedures: NONE  Follow-Up: At [facility], you and your health needs are our priority.  As part of our continuing mission to provide you with exceptional heart care, our providers are all part of one team.  This team includes your primary Cardiologist (physician) and Advanced Practice Providers or APPs (Physician Assistants and Nurse Practitioners) who all work together to provide you with the care you need, when you need it.  Your next appointment:   4 MONTHS  Provider:   Kardie Tobb, DO

## 2024-04-26 NOTE — Addendum Note (Signed)
 Addended by: BILLY SALINES L on: 04/26/2024 10:03 AM   Modules accepted: Orders

## 2024-04-28 ENCOUNTER — Other Ambulatory Visit: Payer: Self-pay | Admitting: Medical Genetics

## 2024-04-28 DIAGNOSIS — Z006 Encounter for examination for normal comparison and control in clinical research program: Secondary | ICD-10-CM

## 2024-05-04 DIAGNOSIS — F322 Major depressive disorder, single episode, severe without psychotic features: Secondary | ICD-10-CM | POA: Diagnosis not present

## 2024-05-04 DIAGNOSIS — F4310 Post-traumatic stress disorder, unspecified: Secondary | ICD-10-CM | POA: Diagnosis not present

## 2024-05-05 ENCOUNTER — Ambulatory Visit: Admitting: Family Medicine

## 2024-05-05 VITALS — BP 119/82 | HR 86 | Ht 65.0 in | Wt 165.0 lb

## 2024-05-05 DIAGNOSIS — N951 Menopausal and female climacteric states: Secondary | ICD-10-CM

## 2024-05-05 DIAGNOSIS — F411 Generalized anxiety disorder: Secondary | ICD-10-CM

## 2024-05-05 DIAGNOSIS — M797 Fibromyalgia: Secondary | ICD-10-CM

## 2024-05-05 DIAGNOSIS — G90A Postural orthostatic tachycardia syndrome (POTS): Secondary | ICD-10-CM | POA: Diagnosis not present

## 2024-05-05 MED ORDER — ESTRADIOL 0.05 MG/24HR TD PTTW: 1.0000 | MEDICATED_PATCH | TRANSDERMAL | 12 refills | Status: DC

## 2024-05-05 NOTE — Progress Notes (Signed)
 Acute Office Visit  Subjective:     Patient ID: Laurie Casey, female    DOB: 1985/08/21, 38 y.o.   MRN: [id number]  No chief complaint on file.   HPI  Discussed the use of AI scribe software for clinical note transcription with the patient, who gave verbal consent to proceed.  History of Present Illness Laurie Casey is a 38 year old female with fibromyalgia and POTS who presents with mood instability and concerns about hormone levels.  She was diagnosed with fibromyalgia prior to the birth of her child, which has made daily activities more challenging. Recently, she was diagnosed with Postural Orthostatic Tachycardia Syndrome (POTS), significantly impacting her ability to perform tasks such as picking up her child or walking long distances. Her heart rate is consistently elevated.  She is experiencing significant mood instability, characterized by 'very low lows' and occasional irritability. Her Wellbutrin dosage was changed from 300 mg once daily to 200 mg twice daily, which she feels might have helped slightly. She is concerned about the possibility of being premenopausal and wishes to check her hormone levels, as she is 38 years old and has no uterus but retains her ovaries. She experiences no hot flashes or night sweats but describes her mood as 'all over the place'.  Her current medications include Wellbutrin, Lyrica , and Cymbalta. She has been on Wellbutrin for three years and feels it may no longer be effective. She is also on Lyrica  and Cymbalta, which are prescribed for fibromyalgia. She is concerned about weight gain with a potential new antidepressant, having worked hard to reduce her weight from 190 pounds post-pregnancy to 165 pounds.  She follows a strict diet due to high cholesterol, avoiding foods that do not come from animals or the ground, except for necessary electrolytes due to POTS. She wears compression socks to manage POTS symptoms, although not consistently.  Her physical activity is limited to gardening.  No family history of breast or ovarian cancer but there is a family history of high cholesterol. Unknown maternal medical history. No vaginal dryness or pain with intercourse, although her interest in sex has decreased slightly over the past month.     ROS Per HPI      Objective:    BP 119/82 (BP Location: Left Arm, Patient Position: Sitting, Cuff Size: Large)   Pulse 86   Ht 5' 5 (1.651 m)   Wt 165 lb (74.8 kg)   LMP 07/19/2022 Comment: Had baby in September  BMI 27.46 kg/m    Physical Exam  No results found for any visits on 05/05/24.      Assessment & Plan:   Perimenopause - Plan: Follicle stimulating hormone, Estrogens, Total  Fibromyalgia  GAD (generalized anxiety disorder)  POTS (postural orthostatic tachycardia syndrome)  Assessment & Plan Perimenopausal symptoms and surgical menopause (post-hysterectomy with retained ovaries) Experiencing mood fluctuations and low energy post-hysterectomy. Concerned about menopause onset. Thyroid levels normal. - Check hormone levels, including estrogen and FSH. - Initiate estrogen replacement therapy using a patch, change twice a week. - Discuss slight risk of blood clots with estrogen therapy, especially with POTS and compression socks. - Follow up in two months to assess treatment effectiveness.  Depression Under psychiatric care with multiple antidepressants. Concerns about weight gain and recent Wellbutrin dosage adjustment. - Consider hormone level assessment before adding another antidepressant.  Postural Orthostatic Tachycardia Syndrome (POTS) Experiencing high heart rate and difficulty with physical activities. Uses compression socks. - Continue using compression socks.  Fibromyalgia  Contributing to fatigue and pain. Managed with Lyrica  and Cymbalta. - Continue Lyrica  and Cymbalta.     Orders Placed This Encounter  Procedures   Follicle stimulating hormone    Estrogens, Total     Meds ordered this encounter  Medications   estradiol (VIVELLE-DOT) 0.05 MG/24HR patch    Sig: Place 1 patch (0.05 mg total) onto the skin 2 (two) times a week.    Dispense:  8 patch    Refill:  12    No follow-ups on file.  Birl Lobello J Ezrael Sam, DO

## 2024-05-06 ENCOUNTER — Ambulatory Visit (HOSPITAL_BASED_OUTPATIENT_CLINIC_OR_DEPARTMENT_OTHER): Attending: Physical Medicine and Rehabilitation | Admitting: Physical Therapy

## 2024-05-06 ENCOUNTER — Encounter (HOSPITAL_BASED_OUTPATIENT_CLINIC_OR_DEPARTMENT_OTHER): Payer: Self-pay | Admitting: Physical Therapy

## 2024-05-06 DIAGNOSIS — R2681 Unsteadiness on feet: Secondary | ICD-10-CM | POA: Insufficient documentation

## 2024-05-06 DIAGNOSIS — M5459 Other low back pain: Secondary | ICD-10-CM | POA: Diagnosis not present

## 2024-05-06 DIAGNOSIS — M6281 Muscle weakness (generalized): Secondary | ICD-10-CM | POA: Diagnosis not present

## 2024-05-06 DIAGNOSIS — M797 Fibromyalgia: Secondary | ICD-10-CM | POA: Diagnosis not present

## 2024-05-06 NOTE — Therapy (Signed)
 OUTPATIENT PHYSICAL THERAPY THORACOLUMBAR TREATMENT   Patient Name: Laurie Casey MRN: [id number] DOB:1985-08-27, 38 y.o., female Today's Date: 05/06/2024  END OF SESSION:  PT End of Session - 05/06/24 1218     Visit Number 2    Number of Visits 12    Date for Recertification  06/18/24    Authorization Type [employer]    PT Start Time 1155   pt arrived late to pool area   PT Stop Time 1230    PT Time Calculation (min) 35 min    Activity Tolerance Patient tolerated treatment well    Behavior During Therapy [facility] for tasks assessed/performed          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   Pre-eclampsia    PTSD (post-traumatic stress disorder) 06/30/2022   Past Surgical History:  Procedure Laterality Date   CESAREAN SECTION  2013, 2015, 2019   x2   CESAREAN SECTION  03/24/2023   with partial historectomy with uterus, cervix and tube removal   CYSTOSCOPY  2008 or 2009   CYSTOSCOPY W/ URETERAL STENT PLACEMENT Right 11/29/2020   Procedure: CYSTOSCOPY WITH RETROGRADE PYELOGRAM/URETERAL STENT PLACEMENT;  Surgeon: Matilda Senior, MD;  Location: [facility] OR;  Service: Urology;  Laterality: Right;   CYSTOSCOPY/URETEROSCOPY/HOLMIUM LASER/STENT PLACEMENT Right 12/13/2020   Procedure: CYSTOSCOPY RIGHT URETEROSCOPY/HOLMIUM LASER/STENT EXTRACTION AND RIGHT JJ STENT PLACEMENT, RIGHT RETROGRADE URETEROSCOPY;  Surgeon: Matilda Senior, MD;  Location: [facility] ORS;  Service: Urology;  Laterality: Right;   URETER SURGERY     x2   WISDOM TOOTH EXTRACTION     Patient Active Problem List   Diagnosis Date Noted   Placenta accreta 12/03/2022   History of cesarean section 10/01/2022   Essential  hypertension 08/11/2022   Obesity (BMI 30.0-34.9) 08/11/2022   High risk multigravida in third trimester 09/10/2017   Hx of preeclampsia, prior pregnancy, currently pregnant 03/16/2014    PCP: Barnie Louder MD  REFERRING PROVIDER:   Lorilee Sven SQUIBB, MD    REFERRING DIAG: M79.7 (ICD-10-CM) - Fibromyalgia   Rationale for Evaluation and Treatment: Rehabilitation  THERAPY DIAG:  Other low back pain  Muscle weakness (generalized)  Unsteadiness on feet  Fibromyalgia  ONSET DATE: chronic  SUBJECTIVE:  SUBJECTIVE STATEMENT: Pt reports that her back was very painful last week, fibro flare.  No pain medicine helped it.  Today is much better.  Pt reports she knows how to swim.  No current access to pool. She was recently diagnosed with POTS.     Initial evaluation:  Fall in June hurt left ankle.  Having a hard time healing.  Ortho put me on a heavy boot for 4 months and it made my back hurt and made ankle worse.  I am in constant pain with my fibro.  Worst in my back.  Get trigger point injection in my back, a lot of joint pain. It feels hot.  Hard to explain. Have a 1 yr old/4 children. We are selling and buying a house. (External stress). Pain interrupts sleep >3 x night. Had abdominal surgery in Sept 2024 and have no core strength  PERTINENT HISTORY:  fibromyalgia  PAIN:  Are you having pain? Yes: NPRS scale: current 5/10; worst 7; least 3/10 Pain location: joints including spine.  LBP > Pain description: hot; restless Aggravating factors: stress Relieving factors: meds; heat; TENs unit, pressure point mat  PRECAUTIONS: None  RED FLAGS: None   WEIGHT BEARING RESTRICTIONS: No  FALLS:  Has patient fallen in last 6 months? Yes 2: fell down stairs; fell in shower  LIVING  ENVIRONMENT: Lives with: lives with their family Lives in: House/apartment Stairs: No Has following equipment at home: None  OCCUPATION: stay at home Mom  PLOF: Independent  PATIENT GOALS: increase toleration to movement; get back to exercise  NEXT MD VISIT: intermittent  OBJECTIVE:  Note: Objective measures were completed at Evaluation unless otherwise noted.   PATIENT SURVEYS:  ODI  COGNITION: Overall cognitive status: Within functional limits for tasks assessed       POSTURE: increased thoracic kyphosis/ hypermobile   LUMBAR ROM:   hypermobile  LOWER EXTREMITY ROM:     Active  Right eval Left eval  Hip flexion    Hip extension    Hip abduction    Hip adduction    Hip internal rotation    Hip external rotation    Knee flexion    Knee extension hyper hyper  Ankle dorsiflexion    Ankle plantarflexion    Ankle inversion    Ankle eversion     (Blank rows = not tested)  LOWER EXTREMITY MMT:     Tested in sitting HD lbs Right eval Left eval  Hip flexion 29.1 21.9  Hip extension    Hip abduction 18.5 17.1  Hip adduction    Hip internal rotation    Hip external rotation    Knee flexion    Knee extension 12.8 22.0  Ankle dorsiflexion    Ankle plantarflexion    Ankle inversion    Ankle eversion     (Blank rows = not tested)   FUNCTIONAL TESTS:  Timed up and go (TUG): 14.17   4 stage balance GAIT: Distance walked: 400 ft Assistive device utilized: None Level of assistance: Complete Independence Comments: antalgic upon initiation of gait  TREATMENT  [facility] Adult PT Treatment:                                             Date: 05/06/24 Pt seen for aquatic therapy today.  Treatment took place in water  3.5-4.75 ft in depth at the [facility] pool.  Temp of water  was 91.  Pt entered/exited the pool via stairs independently with bil rail.  - Intro to aquatic therapy principles - unsupported -> supported with rainbow hand floats walking  forward/ backward - unsupported side stepping  - tandem gait forward/ backward - UE on wall:  toe/heel raises x10; hip add/abd x10 ; hip flexion /extension x10 ; relaxed squat - TrA set with short hollow pull down to thighs x 10; staggered stance x 5 each LE - straddling noodle with UE on wall support: cycling   Pt requires the buoyancy and hydrostatic pressure of water  for support, and to offload joints by unweighting joint load by at least 50 % in navel deep water  and by at least 75-80% in chest to neck deep water .  Viscosity of the water  is needed for resistance of strengthening. Water  current perturbations provides challenge to standing balance requiring increased core activation.                                                                                                                                  PATIENT EDUCATION:  Education details: intro to aquatic therapy  Person educated: Patient Education method: Explanation Education comprehension: verbalized understanding  HOME EXERCISE PROGRAM: Aquatic tba  ASSESSMENT:  CLINICAL IMPRESSION: Pt demonstrates safety and independence in aquatic setting with therapist instructing from deck. Pt is confident in setting, moving throughout all depths easily.  Pt is directed through various movement patterns in standing position. Session shortened due to pt's late arrival to the pool. HR initially 102bpm upon arrival; decreased with rest when in pool and no increase in symptoms during session.  Goals are ongoing.     Initial evaluation:  Patient is a 38 y.o. f who was seen today for physical therapy evaluation and treatment for fibromyalgia. Patient presents with pain limited deficits throughout extremities and axial spine effecting endurance, activity tolerance, gait, balance, and functional mobility with ADL's. She is having to modify and restrict ADL's as per subjective information and objective measures as well as outcome measure score  (ODI 30/50=60%).  She will benefit from skilled PT intervention in aquatics using the properties of water  to improve tolerance to movement building strength and reducing pain  OBJECTIVE IMPAIRMENTS: decreased activity tolerance, decreased balance, decreased strength, and pain.   ACTIVITY LIMITATIONS: carrying, lifting, bending, sitting, standing, squatting, stairs, transfers, locomotion level, and caring for others  PARTICIPATION LIMITATIONS: meal prep, cleaning, shopping, community activity, occupation, and yard work  PERSONAL FACTORS: Age and Past/current experiences are also affecting patient's functional outcome.   REHAB POTENTIAL: Good  CLINICAL DECISION MAKING: Stable/uncomplicated  EVALUATION COMPLEXITY: Low   GOALS: Goals reviewed with patient? No  SHORT TERM GOALS: Target date: 10/30  Pt will tolerate full aquatic sessions consistently without increase in pain and with improving function to demonstrate good toleration and effectiveness of intervention.  Baseline: Goal status: INITIAL  2.  Pt will report a reduction in  pain while submerged by at least 50% to demonstrate using the properties of water  for pain management Baseline:  Goal status: INITIAL    LONG TERM GOALS: Target date: 12/6  Pt to improve on ODI by 13% to demonstrate statistically significant Improvement in function. (MCID 13-15%) Baseline: 30/50=60% Goal status: INITIAL  2.  Pt will report decrease in pain by at least 50% for improved toleration to activity/quality of life and to demonstrate improved management of pain. Baseline: see chart Goal status: INITIAL  3.  Pt will improve strength in hips and knees within 5 lbs of contralateral side to demonstrate improved overall physical function Baseline: see chart Goal status: INITIAL  4.  Pt will report improved toleration to activity by 50% Baseline: see chart Goal status: INITIAL  5.  Pt will consider gaining pool access for use of the  properties of water  for chronic conditions maintaining mobility and minimizing pain. Baseline: none Goal status: INITIAL    PLAN:  PT FREQUENCY: 1-2x/week  PT DURATION: 10 weeks extended due to scheduling conflicts  PLANNED INTERVENTIONS: 97164- PT Re-evaluation, 97110-Therapeutic exercises, 97530- Therapeutic activity, 97112- Neuromuscular re-education, 97535- Self Care, [id number]- Manual therapy, [phone]- Gait training, Patient/Family education, Balance training, Stair training, Taping, Joint mobilization, and DME instructions.  PLAN FOR NEXT SESSION: aquatic: general strengthening (avoid excessive stretching); balance retraining; pain management  Delon Aquas, PTA 05/06/24 5:26 PM [facility] [address] [place], [place], [address] Phone: [phone]   Fax:  [phone]   For all possible CPT codes, reference the Planned Interventions line above.     Check all conditions that are expected to impact treatment: {Conditions expected to impact treatment:Musculoskeletal disorders   If treatment provided at initial evaluation, no treatment charged due to lack of authorization.

## 2024-05-07 LAB — FOLLICLE STIMULATING HORMONE: FSH: 7.9 m[IU]/mL

## 2024-05-07 LAB — ESTROGENS, TOTAL: Estrogen: 192 pg/mL

## 2024-05-09 ENCOUNTER — Encounter (HOSPITAL_BASED_OUTPATIENT_CLINIC_OR_DEPARTMENT_OTHER): Payer: Self-pay | Admitting: Physical Therapy

## 2024-05-11 ENCOUNTER — Ambulatory Visit: Payer: Self-pay | Admitting: Family Medicine

## 2024-05-12 ENCOUNTER — Encounter (HOSPITAL_BASED_OUTPATIENT_CLINIC_OR_DEPARTMENT_OTHER): Payer: Self-pay | Admitting: Physical Therapy

## 2024-05-12 ENCOUNTER — Ambulatory Visit (HOSPITAL_BASED_OUTPATIENT_CLINIC_OR_DEPARTMENT_OTHER): Admitting: Physical Therapy

## 2024-05-12 ENCOUNTER — Encounter: Attending: Physical Medicine and Rehabilitation | Admitting: Physical Medicine and Rehabilitation

## 2024-05-12 DIAGNOSIS — R2681 Unsteadiness on feet: Secondary | ICD-10-CM

## 2024-05-12 DIAGNOSIS — M6281 Muscle weakness (generalized): Secondary | ICD-10-CM

## 2024-05-12 DIAGNOSIS — M5459 Other low back pain: Secondary | ICD-10-CM

## 2024-05-12 DIAGNOSIS — M797 Fibromyalgia: Secondary | ICD-10-CM

## 2024-05-12 MED ORDER — ATOMOXETINE HCL 10 MG PO CAPS
10.0000 mg | ORAL_CAPSULE | Freq: Every day | ORAL | 3 refills | Status: DC
Start: 2024-05-12 — End: 2024-06-07

## 2024-05-12 NOTE — Addendum Note (Signed)
 Addended by: LORILEE SVEN SQUIBB on: 05/12/2024 12:16 PM   Modules accepted: Orders, Level of Service

## 2024-05-12 NOTE — Progress Notes (Addendum)
 Subjective:    Patient ID: Laurie Casey, female    DOB: 06-18-1986, 38 y.o.   MRN: [id number]  HPI An audio/video tele-health visit is felt to be the most appropriate encounter for this patient at this time. This is a follow up tele-visit via phone. The patient is at home. MD is at office. Prior to scheduling this appointment, our staff discussed the limitations of evaluation and management by telemedicine and the availability of in-person appointments. The patient expressed understanding and agreed to proceed.   1) Fibromyalgia: -she has been having a lot of pain in her shoulder blades particularly on the left side -all of her joints have been hurting -she feels like she has restless leg syndrome in her knees -does not take a D3 supplement -has been having a lot of body pain and she thinks this is from lack of activity -her insurance denied aquatherapy because she was doing PT -she has been doing well with Lyrica  and Prazosin  -her PT wanted her to do aquatherapy -the EMDR therapy was not covered by her insurance -she has been struggling with insomnia, depression, anxiety, PTSD -her psychologist is switching practices and she plans to follow her -she plans to get a new psychiatrist at the new practice too -she has been having a lot of pain recently -recently her current partner has been causing her a lot of stress as he has been unkind to her -she feels financially dependent on him since their car and home is in his name -she asks about ketamine Her pain has been severe -she is interested in increasing her topamax  dose -has 4 kids- first when she was 65 -has had fibro for 7 years -she was in a very toxic marriage with someone who was 100 percent disabled from the [employer] -she left her marriage with her kids in the middle of the night -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 -she started taking tart cherry juice at night  4) Numbness and tingling: -started in 2001 when she lost function of her arm -this does bother her  Pain Inventory Average Pain 7 Pain Right Now 7 My pain is intermittent, sharp, burning, dull, stabbing, tingling, and aching  In the last 24 hours, has pain interfered with the following? General activity 8 Relation with others 1 Enjoyment of life 10 What TIME of day is your pain at its worst? morning , daytime, evening, and night Sleep (in general) Fair  Pain is worse with: walking, bending, sitting, inactivity, standing, unsure, and some activites Pain improves with: rest, heat/ice, and TENS Relief from Meds: 2  walk without assistance use a cane how many minutes can you walk? Less than 30 minutes ability to climb steps?  yes do you drive?  yes Do you have any goals in this area?  yes  not employed: date last employed 09/29/2023 I need assistance with the following:  household duties and shopping Do you have any goals in this area?  yes  bladder control problems weakness numbness tremor tingling trouble walking spasms dizziness confusion depression anxiety  Any changes since last visit?  no  Any changes since last visit?  no    Family History  Problem Relation Age of Onset   Hypertension  Father    Social History   Socioeconomic History   Marital status: Significant Other    Spouse name: Not on file   Number of children: 3   Years of education: Not on file   Highest education level: Not on file  Occupational History   Occupation: Vet tech  Tobacco Use   Smoking status: Former    Current packs/day: 0.00    Types: Cigarettes    Quit date: 10/12/2020    Years since quitting: 3.5   Smokeless tobacco: Never   Tobacco comments:    off and on   Vaping Use   Vaping status: Never Used  Substance  and Sexual Activity   Alcohol use: Not Currently   Drug use: Never   Sexual activity: Not on file  Other Topics Concern   Not on file  Social History Narrative   Not on file   Social Drivers of Health   Financial Resource Strain: Low Risk  (07/23/2023)   Overall Financial Resource Strain (CARDIA)    Difficulty of Paying Living Expenses: Not hard at all  Food Insecurity: No Food Insecurity (07/23/2023)   Hunger Vital Sign    Worried About Running Out of Food in the Last Year: Never true    Ran Out of Food in the Last Year: Never true  Transportation Needs: No Transportation Needs (07/23/2023)   PRAPARE - Administrator, Civil Service (Medical): No    Lack of Transportation (Non-Medical): No  Physical Activity: Sufficiently Active (07/23/2023)   Exercise Vital Sign    Days of Exercise per Week: 5 days    Minutes of Exercise per Session: 40 min  Stress: Stress Concern Present (07/23/2023)   [employer] of Occupational Health - Occupational Stress Questionnaire    Feeling of Stress : Very much  Social Connections: Socially Isolated (07/23/2023)   Social Connection and Isolation Panel    Frequency of Communication with Friends and Family: Never    Frequency of Social Gatherings with Friends and Family: Never    Attends Religious Services: Never    Database Administrator or Organizations: No    Attends Banker Meetings: Never    Marital Status: Living with partner   Past Surgical History:  Procedure Laterality Date   CESAREAN SECTION  2013, 2015, 2019   x2   CESAREAN SECTION  03/24/2023   with partial historectomy with uterus, cervix and tube removal   CYSTOSCOPY  2008 or 2009   CYSTOSCOPY W/ URETERAL STENT PLACEMENT Right 11/29/2020   Procedure: CYSTOSCOPY WITH RETROGRADE PYELOGRAM/URETERAL STENT PLACEMENT;  Surgeon: Matilda Senior, MD;  Location: [facility];  Service: Urology;  Laterality: Right;   CYSTOSCOPY/URETEROSCOPY/HOLMIUM LASER/STENT PLACEMENT Right  12/13/2020   Procedure: CYSTOSCOPY RIGHT URETEROSCOPY/HOLMIUM LASER/STENT EXTRACTION AND RIGHT JJ STENT PLACEMENT, RIGHT RETROGRADE URETEROSCOPY;  Surgeon: Matilda Senior, MD;  Location: [facility];  Service: Urology;  Laterality: Right;   URETER SURGERY     x2   WISDOM TOOTH EXTRACTION     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   Pre-eclampsia    PTSD (post-traumatic stress disorder) 06/30/2022   LMP 07/19/2022 Comment: Had baby in September  Opioid Risk  Score:   Fall Risk Score:  `1  Depression screen PHQ 2/9     05/05/2024    1:50 PM 04/21/2024    9:51 AM 03/22/2024   11:14 AM 03/10/2024   10:23 AM 03/09/2024    1:38 PM 12/21/2023    2:08 PM 11/13/2023   11:10 AM  Depression screen PHQ 2/9  Decreased Interest 2 3 3 2 2 1 1   Down, Depressed, Hopeless 2 3 2 2 2 1 1   PHQ - 2 Score 4 6 5 4 4 2 2   Altered sleeping 2 2 1  3  1   Tired, decreased energy 3 3 2  3  1   Change in appetite 3 2 3  3  3   Feeling bad or failure about yourself  0 0 0  0  0  Trouble concentrating 2 0 3  0  1  Moving slowly or fidgety/restless 0 0 0  0  1  Suicidal thoughts 0 0 0  0  0  PHQ-9 Score 14 13 14  13  9   Difficult doing work/chores  Very difficult Very difficult  Very difficult      Review of Systems  Cardiovascular:  Positive for leg swelling.  Gastrointestinal:  Positive for constipation and diarrhea.  Genitourinary:  Positive for frequency.  Musculoskeletal:        Trouble walking- off balance, pain in knees, hips, ankles  Neurological:  Positive for tremors, weakness and numbness.       Tingling  Psychiatric/Behavioral:  Positive for confusion.        Depression, anxiety  All other systems reviewed and are  negative.      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  -discussed her recent pain, discussed that pain can worsen in cold and rainy weather, recommended trial of red light therapy and vitamin D/K2 supplementation daily  -discussed that she was having facial pain and her pain was swollen and hard to talk, discussed that when she massages her masseter muscle   -discussed her pain, depression, anxiety, PTSD, discussed that she has never tried prazosin  and would like to, prescribed 1mg  HS, discussed that she would like to try lyrica  instead of gabapentin , prescribed 50mg  lyrica  to replace her 600mg  gabapentin   -discussed that she found a new partner that is supportive and his family is supportive -continue gabapenitn and cymbalta -discussed that pain has improved since stopping sugar and caffeine -continue PT, discussed that she is working on core strengthening, discussed aquatherapy  -discussed her current stressors, discussed her worsening pain, discussed ketamine and that our medical director has asked that we not prescribe this therapy, discussed that I could provide her a referral to other practices for it but she defers at this time, discussed increasing topamax  and she would like to try this, advised increasing night time dose but maintaining current daytime dose given her daytime fatigue, discussed EMDR therapy for her history of trauma, recommended [facility]ult[facility], recommended couples counseling if her partner is willing to participate, discussed the importance of having a supportive partner in recovering from fibromyalgia  -discussed  that she swam competitively as a child  Discussed extracorporeal  shockwave therapy as a modality for treatment. Discussed that the device looks and feels like a massage gun and I would move it over the area of pain for about 10 minutes. The device releases sound waves to the area of pain and helps to improve blood flow and circulation to improve the healing process. Discuss that this initially induces inflammation and can sometimes cause short-term increase in pain. Discussed that we typically do three weekly treatments, but sometimes up to 6 if needed, and after 6 weeks long term benefits can sometimes be achieved. Discussed that this is an FDA approved device, but not covered by insurance and would cost $60 per session. Will scheduled patient for 6 consecutive appointments and can cancel latter three if benefits are achieved after first three sessions.    -discussed red light therapy  -discussed that she sees a psychiatrist and psychologist in [facility] -Discussed current symptoms of pain and history of pain.  -Discussed benefits of exercise in reducing pain. -Discussed following foods that may reduce pain: 1) Ginger (especially studied for arthritis)- reduce leukotriene production to decrease inflammation 2) Blueberries- high in phytonutrients that decrease inflammation 3) Salmon- marine omega-3s reduce joint swelling and pain 4) Pumpkin seeds- reduce inflammation 5) dark chocolate- reduces inflammation 6) turmeric- reduces inflammation 7) tart cherries - reduce pain and stiffness 8) extra virgin olive oil - its compound olecanthal helps to block prostaglandins  9) chili peppers- can be eaten or applied topically via capsaicin 10) mint- helpful for headache, muscle aches, joint pain, and itching 11) garlic- reduces inflammation 12) Green tea- reduces inflammation and oxidative stress, helps with weight loss, may reduce the risk of cancer, recommend Double [employer] of Tea daily  2) Insomnia: -continue tart cherry juice at night -encouraged napping during the day if her son is napping -discussed that sleep has been better -discussed that this is her related to her PTSD -continue Seroquel -discussed that she wakes up if her pain Link to further information on diet for chronic pain: http://www.bray.com/  -Try to go outside near sunrise -Get exercise during the day.  -Turn off all devices an hour before bedtime.  -Teas that can benefit: chamomile, valerian root, Brahmi (Bacopa) -Can consider over the counter melatonin, magnesium, and/or L-theanine. Melatonin is an anti-oxidant with multiple health benefits. Magnesium is involved in greater than 300 enzymatic reactions in the body and most of us  are deficient as our soil is often depleted. There are 7 different types of magnesium- Bioptemizer's is a supplement with all 7 types, and each has unique benefits. Magnesium can also help with constipation and anxiety.  -Pistachios naturally increase the production of melatonin -Cozy Earth bamboo bed sheets are free from toxic chemicals.  -Tart cherry juice or a tart cherry supplement can improve sleep and soreness post-workout  3) Placenta previa:  -discussed that her tubes, uterus, and cervix were removed  4) PTSD: d/c seroquel, can consider replacing with topamax , discuss with psychology and psychiatry -continue prazosin   5) Anxiety/depression: continue gabapentin  and cymbalta  6) Allergies: pepcid  prescribed  7) Sciatica: -discussed that this has been rough the last couple of days -discussed that it is present on both sides  -discussed PT  8) Numbness in back: -discussed that it also feels like bugs are crawling on her -Discussed Qutenza as an option for neuropathic pain control. Discussed that this is a capsaicin patch, stronger than capsaicin cream. Discussed that it is  currently approved for diabetic peripheral neuropathy  and post-herpetic neuralgia, but that it has also shown benefit in treating other forms of neuropathy. Provided patient with link to site to learn more about the patch: https://www.clark.biz/. Discussed that the patch would be placed in office and benefits usually last 3 months. Discussed that unintended exposure to capsaicin can cause severe irritation of eyes, mucous membranes, respiratory tract, and skin, but that Qutenza is a local treatment and does not have the systemic side effects of other nerve medications. Discussed that there may be pain, itching, erythema, and decreased sensory function associated with the application of Qutenza. Side effects usually subside within 1 week. A cold pack of analgesic medications can help with these side effects. Blood pressure can also be increased due to pain associated with administration of the patch.   9) Brain fog: -discussed weaning off gabapentin  -discussed that she could try just taking gabapentin  at night  10) Overweight: -discussed that she feels really good with whole foods plant based diet  -discussed that she has lost 17 lbs! -discussed that she cheated a little but but now she is back to following her diet -encouraged eating a lot and of fruits and vegetables -discussed that she is eating salads with walnuts, mushrooms, strawberries, raspberries, red wine vinegar -discussed that she eats fish more than red meat -discussed that she makes her own peanut butter  11) Depression: -discussed that her kids moved in with her dad -discussed that her youngest is still with her -discussed that she still gets to see her daughters and they are living about an hour and 15 minutes away -discussed that she is trying to reform relationships that she was forced out of before -encouraged saffron -encouraged exercise  12) POTS: -strattera 10mg  ordered daily  13) Hypermobility: EDS lab test  ordered  10 minutes spent in discussion of her recent pain, discussed that pain can worsen in cold and rainy weather, recommended trial of red light therapy and vitamin D/K2 supplementation daily

## 2024-05-12 NOTE — Therapy (Signed)
 OUTPATIENT PHYSICAL THERAPY THORACOLUMBAR TREATMENT   Patient Name: Laurie Casey MRN: [id number] DOB:Apr 16, 1986, 38 y.o., female Today's Date: 05/12/2024  END OF SESSION:  PT End of Session - 05/12/24 1624     Visit Number 3    Number of Visits 12    Date for Recertification  06/18/24    Authorization Type [employer] med[employer]    PT Start Time 1446    PT Stop Time 1529    PT Time Calculation (min) 43 min    Activity Tolerance Patient tolerated treatment well    Behavior During Therapy [facility] for tasks assessed/performed           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   Pre-eclampsia    PTSD (post-traumatic stress disorder) 06/30/2022   Past Surgical History:  Procedure Laterality Date   CESAREAN SECTION  2013, 2015, 2019   x2   CESAREAN SECTION  03/24/2023   with partial historectomy with uterus, cervix and tube removal   CYSTOSCOPY  2008 or 2009   CYSTOSCOPY W/ URETERAL STENT PLACEMENT Right 11/29/2020   Procedure: CYSTOSCOPY WITH RETROGRADE PYELOGRAM/URETERAL STENT PLACEMENT;  Surgeon: Matilda Senior, MD;  Location: [facility] OR;  Service: Urology;  Laterality: Right;   CYSTOSCOPY/URETEROSCOPY/HOLMIUM LASER/STENT PLACEMENT Right 12/13/2020   Procedure: CYSTOSCOPY RIGHT URETEROSCOPY/HOLMIUM LASER/STENT EXTRACTION AND RIGHT JJ STENT PLACEMENT, RIGHT RETROGRADE URETEROSCOPY;  Surgeon: Matilda Senior, MD;  Location: [facility];  Service: Urology;  Laterality: Right;   URETER SURGERY     x2   WISDOM TOOTH EXTRACTION     Patient Active Problem List   Diagnosis Date Noted   Placenta accreta 12/03/2022   History of cesarean section 10/01/2022   Essential hypertension 08/11/2022   Obesity  (BMI 30.0-34.9) 08/11/2022   High risk multigravida in third trimester 09/10/2017   Hx of preeclampsia, prior pregnancy, currently pregnant 03/16/2014    PCP: Barnie Louder MD  REFERRING PROVIDER:   Lorilee Sven SQUIBB, MD    REFERRING DIAG: M79.7 (ICD-10-CM) - Fibromyalgia   Rationale for Evaluation and Treatment: Rehabilitation  THERAPY DIAG:  Other low back pain  Muscle weakness (generalized)  Unsteadiness on feet  Fibromyalgia  ONSET DATE: chronic  SUBJECTIVE:  SUBJECTIVE STATEMENT: Pt reports good response from last session.  Was a little sore but it was a good sore. General 6/10.     Initial evaluation:  Fall in June hurt left ankle.  Having a hard time healing.  Ortho put me on a heavy boot for 4 months and it made my back hurt and made ankle worse.  I am in constant pain with my fibro.  Worst in my back.  Get trigger point injection in my back, a lot of joint pain. It feels hot.  Hard to explain. Have a 1 yr old/4 children. We are selling and buying a house. (External stress). Pain interrupts sleep >3 x night. Had abdominal surgery in Sept 2024 and have no core strength  PERTINENT HISTORY:  fibromyalgia  PAIN:  Are you having pain? Yes: NPRS scale: current 5/10; worst 7; least 3/10 Pain location: joints including spine.  LBP > Pain description: hot; restless Aggravating factors: stress Relieving factors: meds; heat; TENs unit, pressure point mat  PRECAUTIONS: None  RED FLAGS: None   WEIGHT BEARING RESTRICTIONS: No  FALLS:  Has patient fallen in last 6 months? Yes 2: fell down stairs; fell in shower  LIVING ENVIRONMENT: Lives with: lives with their family Lives in: House/apartment Stairs: No Has following equipment at home: None  OCCUPATION: stay at home  Mom  PLOF: Independent  PATIENT GOALS: increase toleration to movement; get back to exercise  NEXT MD VISIT: intermittent  OBJECTIVE:  Note: Objective measures were completed at Evaluation unless otherwise noted.   PATIENT SURVEYS:  ODI  COGNITION: Overall cognitive status: Within functional limits for tasks assessed       POSTURE: increased thoracic kyphosis/ hypermobile   LUMBAR ROM:   hypermobile  LOWER EXTREMITY ROM:     Active  Right eval Left eval  Hip flexion    Hip extension    Hip abduction    Hip adduction    Hip internal rotation    Hip external rotation    Knee flexion    Knee extension hyper hyper  Ankle dorsiflexion    Ankle plantarflexion    Ankle inversion    Ankle eversion     (Blank rows = not tested)  LOWER EXTREMITY MMT:     Tested in sitting HD lbs Right eval Left eval  Hip flexion 29.1 21.9  Hip extension    Hip abduction 18.5 17.1  Hip adduction    Hip internal rotation    Hip external rotation    Knee flexion    Knee extension 12.8 22.0  Ankle dorsiflexion    Ankle plantarflexion    Ankle inversion    Ankle eversion     (Blank rows = not tested)   FUNCTIONAL TESTS:  Timed up and go (TUG): 14.17   4 stage balance GAIT: Distance walked: 400 ft Assistive device utilized: None Level of assistance: Complete Independence Comments: antalgic upon initiation of gait  TREATMENT  [facility] Adult PT Treatment:                                             Date: 05/12/24 Pt seen for aquatic therapy today.  Treatment took place in water  3.5-4.75 ft in depth at the [facility] pool. Temp of water  was 91.  Pt entered/exited the pool via stairs independently with bil rail.  - unsupported -> supported with rainbow  hand floats walking forward/ backward - unsupported side stepping  - tandem gait forward/ backward - UE on wall:  toe/heel raises x10; hip add/abd x10 ; relaxed squat; toe splaying (foot intrinsic ms engagement) -  decompression using noodle wrapped posteriorly: cycling; hip add/abd -frequent squatted rest periods throughout session  Pt requires the buoyancy and hydrostatic pressure of water  for support, and to offload joints by unweighting joint load by at least 50 % in navel deep water  and by at least 75-80% in chest to neck deep water .  Viscosity of the water  is needed for resistance of strengthening. Water  current perturbations provides challenge to standing balance requiring increased core activation.                                                                                                                                  PATIENT EDUCATION:  Education details: intro to aquatic therapy  Person educated: Patient Education method: Explanation Education comprehension: verbalized understanding  HOME EXERCISE PROGRAM: Aquatic tba  ASSESSMENT:  CLINICAL IMPRESSION: Pt reports good response to 1st aquatic session. States some muscle soreness but no excessive fatigue or pain she recalls. She did nap and sleep particularly well that night.  Progressed through exercises being mindful not to over exert.  Pain reduction by at least 2 NPRS while submerged. Further edu on POTS.  She reports she is being tested for EDS.  Pt edu on that as well.  She requires multiple rest periods in between exercises. She gains good relief in decompression position on noodle.  Goals ongoing.      Initial evaluation:  Patient is a 38 y.o. f who was seen today for physical therapy evaluation and treatment for fibromyalgia. Patient presents with pain limited deficits throughout extremities and axial spine effecting endurance, activity tolerance, gait, balance, and functional mobility with ADL's. She is having to modify and restrict ADL's as per subjective information and objective measures as well as outcome measure score (ODI 30/50=60%).  She will benefit from skilled PT intervention in aquatics using the properties of  water  to improve tolerance to movement building strength and reducing pain  OBJECTIVE IMPAIRMENTS: decreased activity tolerance, decreased balance, decreased strength, and pain.   ACTIVITY LIMITATIONS: carrying, lifting, bending, sitting, standing, squatting, stairs, transfers, locomotion level, and caring for others  PARTICIPATION LIMITATIONS: meal prep, cleaning, shopping, community activity, occupation, and yard work  PERSONAL FACTORS: Age and Past/current experiences are also affecting patient's functional outcome.   REHAB POTENTIAL: Good  CLINICAL DECISION MAKING: Stable/uncomplicated  EVALUATION COMPLEXITY: Low   GOALS: Goals reviewed with patient? No  SHORT TERM GOALS: Target date: 10/30  Pt will tolerate full aquatic sessions consistently without increase in pain and with improving function to demonstrate good toleration and effectiveness of intervention.  Baseline: Goal status: INITIAL  2.  Pt will report a reduction in pain while submerged by at least 50% to demonstrate using the properties of water  for pain  management Baseline:  Goal status: INITIAL    LONG TERM GOALS: Target date: 12/6  Pt to improve on ODI by 13% to demonstrate statistically significant Improvement in function. (MCID 13-15%) Baseline: 30/50=60% Goal status: INITIAL  2.  Pt will report decrease in pain by at least 50% for improved toleration to activity/quality of life and to demonstrate improved management of pain. Baseline: see chart Goal status: INITIAL  3.  Pt will improve strength in hips and knees within 5 lbs of contralateral side to demonstrate improved overall physical function Baseline: see chart Goal status: INITIAL  4.  Pt will report improved toleration to activity by 50% Baseline: see chart Goal status: INITIAL  5.  Pt will consider gaining pool access for use of the properties of water  for chronic conditions maintaining mobility and minimizing pain. Baseline: none Goal  status: INITIAL    PLAN:  PT FREQUENCY: 1-2x/week  PT DURATION: 10 weeks extended due to scheduling conflicts  PLANNED INTERVENTIONS: 97164- PT Re-evaluation, 97110-Therapeutic exercises, 97530- Therapeutic activity, 97112- Neuromuscular re-education, 97535- Self Care, 02859- Manual therapy, [phone]- Gait training, Patient/Family education, Balance training, Stair training, Taping, Joint mobilization, and DME instructions.  PLAN FOR NEXT SESSION: aquatic: general strengthening (avoid excessive stretching); balance retraining; pain management  Ronal Foots) Devion Chriscoe MPT 05/12/24 4:25 PM [facility] [address] [place], [place], [address] Phone: [phone]   Fax:  [phone]    For all possible CPT codes, reference the Planned Interventions line above.     Check all conditions that are expected to impact treatment: {Conditions expected to impact treatment:Musculoskeletal disorders   If treatment provided at initial evaluation, no treatment charged due to lack of authorization.

## 2024-05-15 ENCOUNTER — Encounter: Payer: Self-pay | Admitting: Internal Medicine

## 2024-05-15 ENCOUNTER — Encounter: Payer: Self-pay | Admitting: Physical Medicine and Rehabilitation

## 2024-05-15 ENCOUNTER — Encounter: Payer: Self-pay | Admitting: Cardiology

## 2024-05-16 DIAGNOSIS — F419 Anxiety disorder, unspecified: Secondary | ICD-10-CM | POA: Diagnosis not present

## 2024-05-16 DIAGNOSIS — F32A Depression, unspecified: Secondary | ICD-10-CM | POA: Diagnosis not present

## 2024-05-16 DIAGNOSIS — F4310 Post-traumatic stress disorder, unspecified: Secondary | ICD-10-CM | POA: Diagnosis not present

## 2024-05-17 ENCOUNTER — Other Ambulatory Visit (HOSPITAL_COMMUNITY): Payer: Self-pay | Admitting: Pharmacy Technician

## 2024-05-17 ENCOUNTER — Encounter: Attending: Physical Medicine and Rehabilitation | Admitting: Physical Medicine and Rehabilitation

## 2024-05-17 ENCOUNTER — Encounter (HOSPITAL_BASED_OUTPATIENT_CLINIC_OR_DEPARTMENT_OTHER): Payer: Self-pay | Admitting: Physical Therapy

## 2024-05-17 ENCOUNTER — Ambulatory Visit (HOSPITAL_BASED_OUTPATIENT_CLINIC_OR_DEPARTMENT_OTHER): Attending: Physical Medicine and Rehabilitation | Admitting: Physical Therapy

## 2024-05-17 DIAGNOSIS — M797 Fibromyalgia: Secondary | ICD-10-CM | POA: Insufficient documentation

## 2024-05-17 DIAGNOSIS — F322 Major depressive disorder, single episode, severe without psychotic features: Secondary | ICD-10-CM | POA: Diagnosis not present

## 2024-05-17 DIAGNOSIS — G90A Postural orthostatic tachycardia syndrome (POTS): Secondary | ICD-10-CM | POA: Insufficient documentation

## 2024-05-17 DIAGNOSIS — M6281 Muscle weakness (generalized): Secondary | ICD-10-CM | POA: Insufficient documentation

## 2024-05-17 DIAGNOSIS — M7918 Myalgia, other site: Secondary | ICD-10-CM | POA: Insufficient documentation

## 2024-05-17 DIAGNOSIS — F4310 Post-traumatic stress disorder, unspecified: Secondary | ICD-10-CM | POA: Diagnosis not present

## 2024-05-17 DIAGNOSIS — R2681 Unsteadiness on feet: Secondary | ICD-10-CM | POA: Insufficient documentation

## 2024-05-17 DIAGNOSIS — M5459 Other low back pain: Secondary | ICD-10-CM | POA: Insufficient documentation

## 2024-05-17 MED ORDER — PROPRANOLOL HCL 10 MG PO TABS: 10.0000 mg | ORAL_TABLET | Freq: Every day | ORAL | 3 refills | Status: DC

## 2024-05-17 NOTE — Therapy (Signed)
 OUTPATIENT PHYSICAL THERAPY THORACOLUMBAR TREATMENT   Patient Name: Laurie Casey MRN: [id number] DOB:18-Mar-1986, 38 y.o., female Today's Date: 05/17/2024  END OF SESSION:  PT End of Session - 05/17/24 1404     Visit Number 4    Number of Visits 12    Date for Recertification  06/18/24    Authorization Type [employer] medicaid    PT Start Time 1403    PT Stop Time 1442    PT Time Calculation (min) 39 min    Behavior During Therapy [facility] for tasks assessed/performed           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   Pre-eclampsia    PTSD (post-traumatic stress disorder) 06/30/2022   Past Surgical History:  Procedure Laterality Date   CESAREAN SECTION  2013, 2015, 2019   x2   CESAREAN SECTION  03/24/2023   with partial historectomy with uterus, cervix and tube removal   CYSTOSCOPY  2008 or 2009   CYSTOSCOPY W/ URETERAL STENT PLACEMENT Right 11/29/2020   Procedure: CYSTOSCOPY WITH RETROGRADE PYELOGRAM/URETERAL STENT PLACEMENT;  Surgeon: Matilda Senior, MD;  Location: [facility] OR;  Service: Urology;  Laterality: Right;   CYSTOSCOPY/URETEROSCOPY/HOLMIUM LASER/STENT PLACEMENT Right 12/13/2020   Procedure: CYSTOSCOPY RIGHT URETEROSCOPY/HOLMIUM LASER/STENT EXTRACTION AND RIGHT JJ STENT PLACEMENT, RIGHT RETROGRADE URETEROSCOPY;  Surgeon: Matilda Senior, MD;  Location: [facility];  Service: Urology;  Laterality: Right;   URETER SURGERY     x2   WISDOM TOOTH EXTRACTION     Patient Active Problem List   Diagnosis Date Noted   POTS (postural orthostatic tachycardia syndrome) 05/17/2024   Placenta accreta 12/03/2022   History of cesarean section 10/01/2022   Essential hypertension 08/11/2022    Obesity (BMI 30.0-34.9) 08/11/2022   High risk multigravida in third trimester 09/10/2017   Hx of preeclampsia, prior pregnancy, currently pregnant 03/16/2014    PCP: Barnie Louder MD  REFERRING PROVIDER:   Lorilee Sven SQUIBB, MD    REFERRING DIAG: M79.7 (ICD-10-CM) - Fibromyalgia   Rationale for Evaluation and Treatment: Rehabilitation  THERAPY DIAG:  Other low back pain  Muscle weakness (generalized)  Unsteadiness on feet  Fibromyalgia  ONSET DATE: chronic  SUBJECTIVE:  SUBJECTIVE STATEMENT: Pt reports increase in pain following last session; she speculates it is because she was already in pain when she came to session.  She reports having stressful week.  She states that the jets in hot tub were too strong and the heat was messing with her POTS.    Initial evaluation:  Fall in June hurt left ankle.  Having a hard time healing.  Ortho put me on a heavy boot for 4 months and it made my back hurt and made ankle worse.  I am in constant pain with my fibro.  Worst in my back.  Get trigger point injection in my back, a lot of joint pain. It feels hot.  Hard to explain. Have a 1 yr old/4 children. We are selling and buying a house. (External stress). Pain interrupts sleep >3 x night. Had abdominal surgery in Sept 2024 and have no core strength  PERTINENT HISTORY:  fibromyalgia  PAIN:  Are you having pain? Yes: NPRS scale: current 3/10 generalized; 6/10 shoulders Pain location: see above Pain description: tense, sharp and dull Aggravating factors: stress Relieving factors: meds; heat; TENs unit, pressure point mat  PRECAUTIONS: None  RED FLAGS: None   WEIGHT BEARING RESTRICTIONS: No  FALLS:  Has patient fallen in last 6 months? Yes 2: fell down stairs; fell in shower  LIVING  ENVIRONMENT: Lives with: lives with their family Lives in: House/apartment Stairs: No Has following equipment at home: None  OCCUPATION: stay at home Mom  PLOF: Independent  PATIENT GOALS: increase toleration to movement; get back to exercise  NEXT MD VISIT: intermittent  OBJECTIVE:  Note: Objective measures were completed at Evaluation unless otherwise noted.   PATIENT SURVEYS:  ODI  COGNITION: Overall cognitive status: Within functional limits for tasks assessed       POSTURE: increased thoracic kyphosis/ hypermobile   LUMBAR ROM:   hypermobile  LOWER EXTREMITY ROM:     Active  Right eval Left eval  Hip flexion    Hip extension    Hip abduction    Hip adduction    Hip internal rotation    Hip external rotation    Knee flexion    Knee extension hyper hyper  Ankle dorsiflexion    Ankle plantarflexion    Ankle inversion    Ankle eversion     (Blank rows = not tested)  LOWER EXTREMITY MMT:     Tested in sitting HD lbs Right eval Left eval  Hip flexion 29.1 21.9  Hip extension    Hip abduction 18.5 17.1  Hip adduction    Hip internal rotation    Hip external rotation    Knee flexion    Knee extension 12.8 22.0  Ankle dorsiflexion    Ankle plantarflexion    Ankle inversion    Ankle eversion     (Blank rows = not tested)   FUNCTIONAL TESTS:  Timed up and go (TUG): 14.17   4 stage balance GAIT: Distance walked: 400 ft Assistive device utilized: None Level of assistance: Complete Independence Comments: antalgic upon initiation of gait  TREATMENT  [facility] Adult PT Treatment:                                             Date: 05/17/24 Pt seen for aquatic therapy today.  Treatment took place in water  3.5-4.75 ft in depth at the [facility] pool. Temp of water  was 91.  Pt entered/exited the pool via stairs independently with bil rail.  - decompression using noodle wrapped posteriorly-> between legs: cycling; hip add/abd; hip  flexion/extension - with light resistance bells: walking forward with reciprocal arm swing; side stepping with arm add/abdct ;squatted rest;  backward walking with reciprocal arm swing; marching forward with row motion  - UE on wall:  toe/heel raises x10; hip add/abd x10 ; hip extension to toe touch x 5 each; alternating hamstring curls x 5 each - staggered stance with row with ryder band on rail of ladder in 68ft 8 x 8 each side  Pt requires the buoyancy and hydrostatic pressure of water  for support, and to offload joints by unweighting joint load by at least 50 % in navel deep water  and by at least 75-80% in chest to neck deep water .  Viscosity of the water  is needed for resistance of strengthening. Water  current perturbations provides challenge to standing balance requiring increased core activation.   PATIENT EDUCATION:  Education details: intro to aquatic therapy  Person educated: Patient Education method: Explanation Education comprehension: verbalized understanding  HOME EXERCISE PROGRAM: Aquatic tba  ASSESSMENT:  CLINICAL IMPRESSION:  Pt reports some reduction of pain when exercising while submerged 70%.  Pt is observed moving in a more mindful, less guarded manner today. Will continue to progress as tolerated.  Goals ongoing.      Initial evaluation:  Patient is a 38 y.o. f who was seen today for physical therapy evaluation and treatment for fibromyalgia. Patient presents with pain limited deficits throughout extremities and axial spine effecting endurance, activity tolerance, gait, balance, and functional mobility with ADL's. She is having to modify and restrict ADL's as per subjective information and objective measures as well as outcome measure score (ODI 30/50=60%).  She will benefit from skilled PT intervention in aquatics using the properties of water  to improve tolerance to movement building strength and reducing pain  OBJECTIVE IMPAIRMENTS: decreased activity tolerance,  decreased balance, decreased strength, and pain.   ACTIVITY LIMITATIONS: carrying, lifting, bending, sitting, standing, squatting, stairs, transfers, locomotion level, and caring for others  PARTICIPATION LIMITATIONS: meal prep, cleaning, shopping, community activity, occupation, and yard work  PERSONAL FACTORS: Age and Past/current experiences are also affecting patient's functional outcome.   REHAB POTENTIAL: Good  CLINICAL DECISION MAKING: Stable/uncomplicated  EVALUATION COMPLEXITY: Low   GOALS: Goals reviewed with patient? No  SHORT TERM GOALS: Target date: 10/30  Pt will tolerate full aquatic sessions consistently without increase in pain and with improving function to demonstrate good toleration and effectiveness of intervention.  Baseline: Goal status: In progress - 05/17/24  2.  Pt will report a reduction in pain while submerged by at least 50% to demonstrate using the properties of water  for pain management Baseline:  Goal status: in progress - 05/17/24    LONG TERM GOALS: Target date: 12/6  Pt to improve on ODI by 13% to demonstrate statistically significant Improvement in function. (MCID 13-15%) Baseline: 30/50=60% Goal status: INITIAL  2.  Pt will report decrease in pain by at least 50% for improved toleration to activity/quality of life and to demonstrate improved management of pain. Baseline: see chart Goal status: INITIAL  3.  Pt will improve strength in hips and knees within 5 lbs of contralateral side to demonstrate improved overall physical function Baseline: see chart Goal status: INITIAL  4.  Pt will report improved toleration to activity by 50% Baseline: see chart Goal status: INITIAL  5.  Pt will consider gaining pool access for use of the properties of water  for chronic conditions maintaining mobility and minimizing pain. Baseline: none Goal status: INITIAL    PLAN:  PT FREQUENCY: 1-2x/week  PT DURATION: 10 weeks extended due to  scheduling conflicts  PLANNED INTERVENTIONS: 97164- PT Re-evaluation, 97110-Therapeutic exercises, 97530- Therapeutic activity, 97112- Neuromuscular re-education, 97535- Self Care, 02859- Manual therapy, [phone]- Gait training, Patient/Family education, Balance training, Stair training, Taping, Joint mobilization, and DME instructions.  PLAN FOR NEXT SESSION: aquatic: general strengthening (avoid excessive stretching); balance retraining; pain management    For all possible CPT codes, reference the Planned Interventions line above.     Check all conditions that are expected to impact treatment: {Conditions expected to impact treatment:Musculoskeletal disorders   If treatment provided at initial evaluation, no treatment charged due to lack of authorization.

## 2024-05-17 NOTE — Progress Notes (Signed)
 Subjective:    Patient ID: Laurie Casey, female    DOB: [birth year], 38 y.o.   MRN: [id number]  HPI An audio/video tele-health visit is felt to be the most appropriate encounter for this patient at this time. This is a follow up tele-visit via phone. The patient is at home. MD is at office. Prior to scheduling this appointment, our staff discussed the limitations of evaluation and management by telemedicine and the availability of in-person appointments. The patient expressed understanding and agreed to proceed.   1) Fibromyalgia: -she has been having a lot of pain in her shoulder blades particularly on the left side -all of her joints have been hurting -she feels like she has restless leg syndrome in her knees -does not take a D3 supplement -has been having a lot of body pain and she thinks this is from lack of activity -her insurance denied aquatherapy because she was doing PT -she has been doing well with Lyrica  and Prazosin  -her PT wanted her to do aquatherapy -the EMDR therapy was not covered by her insurance -she has been struggling with insomnia, depression, anxiety, PTSD -her psychologist is switching practices and she plans to follow her -she plans to get a new psychiatrist at the new practice too -she has been having a lot of pain recently -recently her current partner has been causing her a lot of stress as he has been unkind to her -she feels financially dependent on him since their car and home is in his name -she asks about ketamine Her pain has been severe -she is interested in increasing her topamax  dose -has 4 kids- first when she was 50 -has had fibro for 7 years -she was in a very toxic marriage with someone who was 100 percent disabled from the [employer] -she left her marriage with her kids in the middle of the night -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 -she started taking tart cherry juice at night  4) Numbness and tingling: -started in 2001 when she lost function of her arm -this does bother her  5) POTS: -her HR goes up to 137 -her BP stays stable- she takes amlodipine  10mg  daily -she would like to try propanolol and IVF in the future -she has tried propanolol in the past and this did help  Pain Inventory Average Pain 7 Pain Right Now 7 My pain is intermittent, sharp, burning, dull, stabbing, tingling, and aching  In the last 24 hours, has pain interfered with the following? General activity 8 Relation with others 1 Enjoyment of life 10 What TIME of day is your pain at its worst? morning , daytime, evening, and night Sleep (in general) Fair  Pain is worse with: walking, bending, sitting, inactivity, standing, unsure, and some activites Pain improves with: rest, heat/ice, and TENS Relief from Meds: 2  walk without assistance use a cane how many minutes can you walk? Less than 30 minutes ability to climb steps?  yes do you drive?  yes Do you have any goals in this area?  yes  not employed: date last employed 09/29/2023 I need assistance with the following:  household duties and shopping Do you have any goals in this area?  yes  bladder control problems weakness  numbness tremor tingling trouble walking spasms dizziness confusion depression anxiety  Any changes since last visit?  no  Any changes since last visit?  no    Family History  Problem Relation Age of Onset   Hypertension Father    Social History   Socioeconomic History   Marital status: Significant Other    Spouse name: Not on file   Number of children: 3   Years of education: Not on file   Highest education level: Not on file  Occupational History   Occupation: Vet tech  Tobacco Use   Smoking status: Former    Current  packs/day: 0.00    Types: Cigarettes    Quit date: 10/12/2020    Years since quitting: 3.5   Smokeless tobacco: Never   Tobacco comments:    off and on   Vaping Use   Vaping status: Never Used  Substance and Sexual Activity   Alcohol use: Not Currently   Drug use: Never   Sexual activity: Not on file  Other Topics Concern   Not on file  Social History Narrative   Not on file   Social Drivers of Health   Financial Resource Strain: Low Risk  (07/23/2023)   Overall Financial Resource Strain (CARDIA)    Difficulty of Paying Living Expenses: Not hard at all  Food Insecurity: No Food Insecurity (07/23/2023)   Hunger Vital Sign    Worried About Running Out of Food in the Last Year: Never true    Ran Out of Food in the Last Year: Never true  Transportation Needs: No Transportation Needs (07/23/2023)   PRAPARE - Administrator, Civil Service (Medical): No    Lack of Transportation (Non-Medical): No  Physical Activity: Sufficiently Active (07/23/2023)   Exercise Vital Sign    Days of Exercise per Week: 5 days    Minutes of Exercise per Session: 40 min  Stress: Stress Concern Present (07/23/2023)   [employer] of Occupational Health - Occupational Stress Questionnaire    Feeling of Stress : Very much  Social Connections: Socially Isolated (07/23/2023)   Social Connection and Isolation Panel    Frequency of Communication with Friends and Family: Never    Frequency of Social Gatherings with Friends and Family: Never    Attends Religious Services: Never    Database Administrator or Organizations: No    Attends Banker Meetings: Never    Marital Status: Living with partner   Past Surgical History:  Procedure Laterality Date   CESAREAN SECTION  2013, 2015, 2019   x2   CESAREAN SECTION  03/24/2023   with partial historectomy with uterus, cervix and tube removal   CYSTOSCOPY  2008 or 2009   CYSTOSCOPY W/ URETERAL STENT PLACEMENT Right 11/29/2020   Procedure:  CYSTOSCOPY WITH RETROGRADE PYELOGRAM/URETERAL STENT PLACEMENT;  Surgeon: Matilda Senior, MD;  Location: [facility];  Service: Urology;  Laterality: Right;   CYSTOSCOPY/URETEROSCOPY/HOLMIUM LASER/STENT PLACEMENT Right 12/13/2020   Procedure: CYSTOSCOPY RIGHT URETEROSCOPY/HOLMIUM LASER/STENT EXTRACTION AND RIGHT JJ STENT PLACEMENT, RIGHT RETROGRADE URETEROSCOPY;  Surgeon: Matilda Senior, MD;  Location: [facility];  Service: Urology;  Laterality: Right;   URETER SURGERY     x2   WISDOM TOOTH EXTRACTION     Past Medical History:  Diagnosis Date   Anxiety    Asthma    with pregnancy   Calculus of ureter 11/29/2020   Eczema 06/30/2022   Fibromyalgia 08/11/2022   GAD (generalized anxiety disorder) 06/30/2022   History of kidney  stones    History of migraine    Major depressive disorder, single episode, 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   Pre-eclampsia    PTSD (post-traumatic stress disorder) 06/30/2022   LMP 07/19/2022 Comment: Had baby in September  Opioid Risk Score:   Fall Risk Score:  `1  Depression screen PHQ 2/9     05/05/2024    1:50 PM 04/21/2024    9:51 AM 03/22/2024   11:14 AM 03/10/2024   10:23 AM 03/09/2024    1:38 PM 12/21/2023    2:08 PM 11/13/2023   11:10 AM  Depression screen PHQ 2/9  Decreased Interest 2 3 3 2 2 1 1   Down, Depressed, Hopeless 2 3 2 2 2 1 1   PHQ - 2 Score 4 6 5 4 4 2 2   Altered sleeping 2 2 1  3  1   Tired, decreased energy 3 3 2  3  1   Change in appetite 3 2 3  3  3   Feeling bad or failure about yourself  0 0 0  0  0  Trouble concentrating 2 0 3  0  1  Moving slowly or fidgety/restless 0 0 0  0  1  Suicidal thoughts 0 0 0  0  0  PHQ-9 Score 14 13 14  13  9   Difficult doing work/chores  Very difficult Very difficult  Very difficult      Review of Systems  Cardiovascular:  Positive for leg swelling.  Gastrointestinal:  Positive for constipation and diarrhea.  Genitourinary:  Positive for frequency.  Musculoskeletal:        Trouble walking- off balance, pain in  knees, hips, ankles  Neurological:  Positive for tremors, weakness and numbness.       Tingling  Psychiatric/Behavioral:  Positive for confusion.        Depression, anxiety  All other systems reviewed and are negative.      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  -discussed her recent pain, discussed that pain can worsen in cold and rainy weather, recommended trial of red light therapy and vitamin D/K2 supplementation daily  -discussed that she was having facial pain and her pain was swollen and hard to talk, discussed that when she massages her masseter muscle   -discussed her pain, depression, anxiety, PTSD, discussed that she has never tried prazosin  and would like to, prescribed 1mg  HS, discussed that she would like to try lyrica  instead of gabapentin , prescribed 50mg  lyrica  to replace her 600mg  gabapentin   -discussed that she found a new partner that is supportive and his family is supportive -continue gabapenitn and cymbalta -discussed that pain has improved since stopping sugar and caffeine -continue PT, discussed that she is working on core strengthening, discussed aquatherapy  -discussed her current stressors, discussed her worsening pain, discussed ketamine and that our medical director has asked that we not prescribe this therapy, discussed that I could provide her a referral to other practices for it but she defers at this time, discussed increasing topamax  and she would like to try this, advised increasing night time dose but maintaining current daytime dose given her daytime fatigue, discussed EMDR therapy for her history of  trauma, recommended [facility] Nade[facility] Schultze, recommended couples counseling if her partner is willing to participate, discussed the importance of having a supportive partner in recovering from fibromyalgia  -discussed that she swam competitively as a child  Discussed extracorporeal shockwave therapy as a modality for treatment. Discussed that the device looks and feels like a massage gun and I would move it over the area of pain for about 10 minutes. The device releases sound waves to the area of pain and helps to improve blood flow and circulation to improve the healing process. Discuss that this initially induces inflammation and can sometimes cause short-term increase in pain. Discussed that we typically do three weekly treatments, but sometimes up to 6 if needed, and after 6 weeks long term benefits can sometimes be achieved. Discussed that this is an FDA approved device, but not covered by insurance and would cost $60 per session. Will scheduled patient for 6 consecutive appointments and can cancel latter three if benefits are achieved after first three sessions.    -discussed red light therapy  -discussed that she sees a psychiatrist and psychologist in [facility] -Discussed current symptoms of pain and history of pain.  -Discussed benefits of exercise in reducing pain. -Discussed following foods that may reduce pain: 1) Ginger (especially studied for arthritis)- reduce leukotriene production to decrease inflammation 2) Blueberries- high in phytonutrients that decrease inflammation 3) Salmon- marine omega-3s reduce joint swelling and pain 4) Pumpkin seeds- reduce inflammation 5) dark chocolate- reduces inflammation 6) turmeric- reduces inflammation 7) tart cherries - reduce pain and stiffness 8) extra virgin olive oil - its compound olecanthal helps to block prostaglandins  9) chili peppers- can be eaten or applied topically via capsaicin 10) mint- helpful for headache,  muscle aches, joint pain, and itching 11) garlic- reduces inflammation 12) Green tea- reduces inflammation and oxidative stress, helps with weight loss, may reduce the risk of cancer, recommend Double [employer] of Tea daily  2) Insomnia: -continue tart cherry juice at night -encouraged napping during the day if her son is napping -discussed that sleep has been better -discussed that this is her related to her PTSD -continue Seroquel -discussed that she wakes up if her pain Link to further information on diet for chronic pain: http://www.bray.com/  -Try to go outside near sunrise -Get exercise during the day.  -Turn off all devices an hour before bedtime.  -Teas that can benefit: chamomile, valerian root, Brahmi (Bacopa) -Can consider over the counter melatonin, magnesium, and/or L-theanine. Melatonin is an anti-oxidant with multiple health benefits. Magnesium is involved in greater than 300 enzymatic reactions in the body and most of us  are deficient as our soil is often depleted. There are 7 different types of magnesium- Bioptemizer's is a supplement with all 7 types, and each has unique benefits. Magnesium can also help with constipation and anxiety.  -Pistachios naturally increase the production of melatonin -Cozy Earth bamboo bed sheets are free from toxic chemicals.  -Tart cherry juice or a tart cherry supplement can improve sleep and soreness post-workout  3) Placenta previa:  -discussed that her tubes, uterus, and cervix were removed  4) PTSD: d/c seroquel, can consider replacing with topamax , discuss with psychology and psychiatry -continue prazosin   5) Anxiety/depression: continue gabapentin  and cymbalta  6) Allergies: pepcid  prescribed  7) Sciatica: -discussed that this has been rough the last couple of days -discussed that it is present on both sides  -discussed PT  8) Numbness in  back: -discussed that  it also feels like bugs are crawling on her -Discussed Qutenza as an option for neuropathic pain control. Discussed that this is a capsaicin patch, stronger than capsaicin cream. Discussed that it is currently approved for diabetic peripheral neuropathy and post-herpetic neuralgia, but that it has also shown benefit in treating other forms of neuropathy. Provided patient with link to site to learn more about the patch: https://www.clark.biz/. Discussed that the patch would be placed in office and benefits usually last 3 months. Discussed that unintended exposure to capsaicin can cause severe irritation of eyes, mucous membranes, respiratory tract, and skin, but that Qutenza is a local treatment and does not have the systemic side effects of other nerve medications. Discussed that there may be pain, itching, erythema, and decreased sensory function associated with the application of Qutenza. Side effects usually subside within 1 week. A cold pack of analgesic medications can help with these side effects. Blood pressure can also be increased due to pain associated with administration of the patch.   9) Brain fog: -discussed weaning off gabapentin  -discussed that she could try just taking gabapentin  at night  10) Overweight: -discussed that she feels really good with whole foods plant based diet  -discussed that she has lost 17 lbs! -discussed that she cheated a little but but now she is back to following her diet -encouraged eating a lot and of fruits and vegetables -discussed that she is eating salads with walnuts, mushrooms, strawberries, raspberries, red wine vinegar -discussed that she eats fish more than red meat -discussed that she makes her own peanut butter  11) Depression: -discussed that her kids moved in with her dad -discussed that her youngest is still with her -discussed that she still gets to see her daughters and they are living about an hour and 15 minutes  away -discussed that she is trying to reform relationships that she was forced out of before -encouraged saffron -encouraged exercise  12) POTS: -discussed her POTS, discussion of trying IVF and propanolol, discussed skipping her amlodipine  on days she takes her propanolol to avoid hypotension, discussed with Millicent Blanch who is helping to schedule transfusions, discussed that this should be covered by insurance, discussed that POTS often goes hand in hand with EDS, discussed her hypermobility   13) Hypermobility: EDS lab test ordered  12 minutes spent in discussion of her POTS, discussion of trying IVF and propanolol, discussed skipping her amlodipine  on days she takes her propanolol to avoid hypotension, discussed with Millicent Blanch who is helping to schedule transfusions, discussed that this should be covered by insurance, discussed that POTS often goes hand in hand with EDS, discussed her hypermobility

## 2024-05-18 ENCOUNTER — Ambulatory Visit (INDEPENDENT_AMBULATORY_CARE_PROVIDER_SITE_OTHER)

## 2024-05-18 VITALS — BP 131/88 | HR 84 | Temp 98.3°F | Resp 18 | Ht 65.0 in | Wt 164.6 lb

## 2024-05-18 DIAGNOSIS — G90A Postural orthostatic tachycardia syndrome (POTS): Secondary | ICD-10-CM

## 2024-05-18 MED ORDER — SODIUM CHLORIDE 0.9 % IV BOLUS
250.0000 mL | Freq: Once | INTRAVENOUS | Status: AC
  Administered 2024-05-18: 250 mL via INTRAVENOUS
  Filled 2024-05-18: qty 250

## 2024-05-18 NOTE — Progress Notes (Signed)
 Diagnosis: POTS  Provider:  Praveen Mannam MD  Procedure: IV Infusion  IV Type: Peripheral, IV Location: R Antecubital  Normal Saline, Dose: 250 ml  Infusion Start Time: 1431  Infusion Stop Time: 1530  Post Infusion IV Care: Peripheral IV Discontinued  Discharge: Condition: Good, Destination: Home . AVS Declined  Performed by:  Leita FORBES Miles, LPN

## 2024-05-19 ENCOUNTER — Ambulatory Visit

## 2024-05-19 VITALS — BP 126/84 | HR 80 | Temp 98.4°F | Resp 12 | Ht 65.0 in | Wt 165.4 lb

## 2024-05-19 DIAGNOSIS — G90A Postural orthostatic tachycardia syndrome (POTS): Secondary | ICD-10-CM

## 2024-05-19 MED ORDER — SODIUM CHLORIDE 0.9 % IV BOLUS
250.0000 mL | Freq: Once | INTRAVENOUS | Status: AC
  Administered 2024-05-19: 250 mL via INTRAVENOUS
  Filled 2024-05-19: qty 250

## 2024-05-19 NOTE — Progress Notes (Signed)
 Diagnosis: POTS  Provider:  Mannam, Praveen MD  Procedure: IV Infusion  IV Type: Peripheral, IV Location: R Antecubital  Normal Saline, Dose: 250 mg  Infusion Start Time: 1415  Infusion Stop Time: 1522  Post Infusion IV Care: Peripheral IV Discontinued  Discharge: Condition: Good, Destination: Home . AVS Declined  Performed by:  Rocky FORBES Sar, RN

## 2024-05-19 NOTE — Telephone Encounter (Signed)
 Appt made.

## 2024-05-19 NOTE — Telephone Encounter (Signed)
 Left message for patient to call back

## 2024-05-23 ENCOUNTER — Ambulatory Visit (INDEPENDENT_AMBULATORY_CARE_PROVIDER_SITE_OTHER)

## 2024-05-23 VITALS — BP 122/85 | HR 64 | Temp 98.0°F | Resp 18 | Ht 65.0 in | Wt 163.6 lb

## 2024-05-23 DIAGNOSIS — F322 Major depressive disorder, single episode, severe without psychotic features: Secondary | ICD-10-CM | POA: Diagnosis not present

## 2024-05-23 DIAGNOSIS — F4310 Post-traumatic stress disorder, unspecified: Secondary | ICD-10-CM | POA: Diagnosis not present

## 2024-05-23 DIAGNOSIS — G90A Postural orthostatic tachycardia syndrome (POTS): Secondary | ICD-10-CM

## 2024-05-23 MED ORDER — SODIUM CHLORIDE 0.9 % IV BOLUS
250.0000 mL | Freq: Once | INTRAVENOUS | Status: AC
  Administered 2024-05-23: 250 mL via INTRAVENOUS
  Filled 2024-05-23: qty 250

## 2024-05-23 NOTE — Progress Notes (Signed)
 Diagnosis: Iron Deficiency Anemia  Provider:  Praveen Mannam MD  Procedure: IV Infusion  IV Type: Peripheral, IV Location: R Antecubital  Normal Saline, Dose: 250 mg  Infusion Start Time: 1345  Infusion Stop Time: 1450  Post Infusion IV Care: Peripheral IV Discontinued  Discharge: Condition: Good, Destination: Home . AVS Declined  Performed by:  Adrin Julian, RN

## 2024-05-24 ENCOUNTER — Ambulatory Visit (HOSPITAL_BASED_OUTPATIENT_CLINIC_OR_DEPARTMENT_OTHER): Admitting: Physical Therapy

## 2024-05-24 ENCOUNTER — Encounter (HOSPITAL_BASED_OUTPATIENT_CLINIC_OR_DEPARTMENT_OTHER): Payer: Self-pay

## 2024-05-25 ENCOUNTER — Ambulatory Visit (INDEPENDENT_AMBULATORY_CARE_PROVIDER_SITE_OTHER)

## 2024-05-25 VITALS — BP 127/89 | HR 72 | Temp 98.4°F | Resp 16 | Ht 65.0 in | Wt 164.8 lb

## 2024-05-25 DIAGNOSIS — G90A Postural orthostatic tachycardia syndrome (POTS): Secondary | ICD-10-CM

## 2024-05-25 MED ORDER — SODIUM CHLORIDE 0.9 % IV BOLUS
250.0000 mL | Freq: Once | INTRAVENOUS | Status: AC
  Administered 2024-05-25: 250 mL via INTRAVENOUS
  Filled 2024-05-25: qty 250

## 2024-05-25 NOTE — Progress Notes (Signed)
 Diagnosis: postural orthostatic tachycardia syndrome   Provider:  Praveen Mannam MD  Procedure: IV Infusion  IV Type: Peripheral, IV Location: R Antecubital  Normal Saline, Dose: 250 mg  Infusion Start Time: 1218  Infusion Stop Time: 1323  Post Infusion IV Care: Peripheral IV Discontinued  Discharge: Condition: Good, Destination: Home . AVS Declined  Performed by:  Nia Nathaniel, RN

## 2024-05-30 ENCOUNTER — Ambulatory Visit (INDEPENDENT_AMBULATORY_CARE_PROVIDER_SITE_OTHER)

## 2024-05-30 VITALS — BP 131/91 | HR 66 | Temp 98.4°F | Resp 14 | Ht 65.0 in | Wt 166.4 lb

## 2024-05-30 DIAGNOSIS — F4310 Post-traumatic stress disorder, unspecified: Secondary | ICD-10-CM | POA: Diagnosis not present

## 2024-05-30 DIAGNOSIS — G90A Postural orthostatic tachycardia syndrome (POTS): Secondary | ICD-10-CM | POA: Diagnosis not present

## 2024-05-30 DIAGNOSIS — F322 Major depressive disorder, single episode, severe without psychotic features: Secondary | ICD-10-CM | POA: Diagnosis not present

## 2024-05-30 MED ORDER — SODIUM CHLORIDE 0.9 % IV BOLUS
250.0000 mL | Freq: Once | INTRAVENOUS | Status: AC
  Administered 2024-05-30: 250 mL via INTRAVENOUS
  Filled 2024-05-30: qty 250

## 2024-05-30 NOTE — Progress Notes (Signed)
 Diagnosis: POTS (postural orthostatic tachycardia syndrome)   Provider:  Praveen Mannam MD  Procedure: IV Infusion  IV Type: Peripheral, IV Location: R Antecubital  Normal Saline, Dose: 250 mg  Infusion Start Time: 1417  Infusion Stop Time: 1519  Post Infusion IV Care: Peripheral IV Discontinued  Discharge: Condition: Good, Destination: Home . AVS Declined  Performed by:  Maximiano JONELLE Pouch, LPN

## 2024-05-31 ENCOUNTER — Ambulatory Visit (HOSPITAL_BASED_OUTPATIENT_CLINIC_OR_DEPARTMENT_OTHER): Admitting: Physical Therapy

## 2024-05-31 ENCOUNTER — Encounter (HOSPITAL_BASED_OUTPATIENT_CLINIC_OR_DEPARTMENT_OTHER): Payer: Self-pay | Admitting: Physical Therapy

## 2024-05-31 DIAGNOSIS — M6281 Muscle weakness (generalized): Secondary | ICD-10-CM

## 2024-05-31 DIAGNOSIS — R2681 Unsteadiness on feet: Secondary | ICD-10-CM

## 2024-05-31 DIAGNOSIS — M797 Fibromyalgia: Secondary | ICD-10-CM

## 2024-05-31 DIAGNOSIS — M5459 Other low back pain: Secondary | ICD-10-CM

## 2024-05-31 NOTE — Therapy (Signed)
 OUTPATIENT PHYSICAL THERAPY THORACOLUMBAR TREATMENT   Patient Name: Laurie Casey MRN: [id number] DOB:[birth year], 38 y.o., female Today's Date: 05/31/2024  END OF SESSION:  PT End of Session - 05/31/24 1156     Visit Number 5    Number of Visits 12    Date for Recertification  06/18/24    Authorization Type [employer] medicaid    PT Start Time 1150    PT Stop Time 1230    PT Time Calculation (min) 40 min    Behavior During Therapy [facility] for tasks assessed/performed           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   Pre-eclampsia    PTSD (post-traumatic stress disorder) 06/30/2022   Past Surgical History:  Procedure Laterality Date   CESAREAN SECTION  2013, 2015, 2019   x2   CESAREAN SECTION  03/24/2023   with partial historectomy with uterus, cervix and tube removal   CYSTOSCOPY  2008 or 2009   CYSTOSCOPY W/ URETERAL STENT PLACEMENT Right 11/29/2020   Procedure: CYSTOSCOPY WITH RETROGRADE PYELOGRAM/URETERAL STENT PLACEMENT;  Surgeon: Matilda Senior, MD;  Location: [facility] OR;  Service: Urology;  Laterality: Right;   CYSTOSCOPY/URETEROSCOPY/HOLMIUM LASER/STENT PLACEMENT Right 12/13/2020   Procedure: CYSTOSCOPY RIGHT URETEROSCOPY/HOLMIUM LASER/STENT EXTRACTION AND RIGHT JJ STENT PLACEMENT, RIGHT RETROGRADE URETEROSCOPY;  Surgeon: Matilda Senior, MD;  Location: [facility] ORS;  Service: Urology;  Laterality: Right;   URETER SURGERY     x2   WISDOM TOOTH EXTRACTION     Patient Active Problem List   Diagnosis Date Noted   POTS (postural orthostatic tachycardia syndrome) 05/17/2024   Placenta accreta 12/03/2022   History of cesarean section 10/01/2022   Essential hypertension 08/11/2022    Obesity (BMI 30.0-34.9) 08/11/2022   High risk multigravida in third trimester 09/10/2017   Hx of preeclampsia, prior pregnancy, currently pregnant 03/16/2014    PCP: Barnie Louder MD  REFERRING PROVIDER:   Lorilee Sven SQUIBB, MD    REFERRING DIAG: M79.7 (ICD-10-CM) - Fibromyalgia   Rationale for Evaluation and Treatment: Rehabilitation  THERAPY DIAG:  Other low back pain  Muscle weakness (generalized)  Unsteadiness on feet  Fibromyalgia  ONSET DATE: chronic  SUBJECTIVE:  SUBJECTIVE STATEMENT: Pt states the aquatic therapy helps for a couple days after session but then the normal  pain returns.  Pain today 7/10 but have a sinus infection and that always makes my pain worse.   Initial evaluation:  Fall in June hurt left ankle.  Having a hard time healing.  Ortho put me on a heavy boot for 4 months and it made my back hurt and made ankle worse.  I am in constant pain with my fibro.  Worst in my back.  Get trigger point injection in my back, a lot of joint pain. It feels hot.  Hard to explain. Have a 1 yr old/4 children. We are selling and buying a house. (External stress). Pain interrupts sleep >3 x night. Had abdominal surgery in Sept 2024 and have no core strength  PERTINENT HISTORY:  fibromyalgia  PAIN:  Are you having pain? Yes: NPRS scale: current 3/10 generalized; 6/10 shoulders Pain location: see above Pain description: tense, sharp and dull Aggravating factors: stress Relieving factors: meds; heat; TENs unit, pressure point mat  PRECAUTIONS: None  RED FLAGS: None   WEIGHT BEARING RESTRICTIONS: No  FALLS:  Has patient fallen in last 6 months? Yes 2: fell down stairs; fell in shower  LIVING ENVIRONMENT: Lives with: lives with their family Lives in:  House/apartment Stairs: No Has following equipment at home: None  OCCUPATION: stay at home Mom  PLOF: Independent  PATIENT GOALS: increase toleration to movement; get back to exercise  NEXT MD VISIT: intermittent  OBJECTIVE:  Note: Objective measures were completed at Evaluation unless otherwise noted.   PATIENT SURVEYS:  ODI  COGNITION: Overall cognitive status: Within functional limits for tasks assessed       POSTURE: increased thoracic kyphosis/ hypermobile   LUMBAR ROM:   hypermobile  LOWER EXTREMITY ROM:     Active  Right eval Left eval  Hip flexion    Hip extension    Hip abduction    Hip adduction    Hip internal rotation    Hip external rotation    Knee flexion    Knee extension hyper hyper  Ankle dorsiflexion    Ankle plantarflexion    Ankle inversion    Ankle eversion     (Blank rows = not tested)  LOWER EXTREMITY MMT:     Tested in sitting HD lbs Right eval Left eval  Hip flexion 29.1 21.9  Hip extension    Hip abduction 18.5 17.1  Hip adduction    Hip internal rotation    Hip external rotation    Knee flexion    Knee extension 12.8 22.0  Ankle dorsiflexion    Ankle plantarflexion    Ankle inversion    Ankle eversion     (Blank rows = not tested)   FUNCTIONAL TESTS:  Timed up and go (TUG): 14.17   4 stage balance GAIT: Distance walked: 400 ft Assistive device utilized: None Level of assistance: Complete Independence Comments: antalgic upon initiation of gait  TREATMENT  [facility] Adult PT Treatment:                                             Date: 05/31/24 Pt seen for aquatic therapy today.  Treatment took place in water  3.5-4.75 ft in depth at the [facility] pool. Temp of water  was 91.  Pt entered/exited the pool via stairs independently  with bil rail.  - decompression using noodle wrapped posteriorly-> between legs: cycling; hip add/abd; hip flexion/extension - with light resistance bells: walking forward with  reciprocal arm swing; side stepping with arm add/abdct ;squatted rest; marching forward with row motion  - UE on wall:  toe/heel raises x10; hip add/abd x10 ; hip extension to toe touch x 5 each; alternating hamstring curls x 5 each - cycling on noodle   Pt requires the buoyancy and hydrostatic pressure of water  for support, and to offload joints by unweighting joint load by at least 50 % in navel deep water  and by at least 75-80% in chest to neck deep water .  Viscosity of the water  is needed for resistance of strengthening. Water  current perturbations provides challenge to standing balance requiring increased core activation.   PATIENT EDUCATION:  Education details: intro to aquatic therapy  Person educated: Patient Education method: Explanation Education comprehension: verbalized understanding  HOME EXERCISE PROGRAM: Aquatic tba  ASSESSMENT:  CLINICAL IMPRESSION: Pt states she does have a reduction of pain> 50% while submerged and is tired post session but no limiting pain or fatigue. She does feel as though she is stretched out and more mobile for 2-3 days post session.  Reports feeling accomplished.   All Stg met.  Pt will continue to benefit from skilled aquatic PT with focus to improve strength, manage chronic pain and improve QOL        Initial evaluation:  Patient is a 38 y.o. f who was seen today for physical therapy evaluation and treatment for fibromyalgia. Patient presents with pain limited deficits throughout extremities and axial spine effecting endurance, activity tolerance, gait, balance, and functional mobility with ADL's. She is having to modify and restrict ADL's as per subjective information and objective measures as well as outcome measure score (ODI 30/50=60%).  She will benefit from skilled PT intervention in aquatics using the properties of water  to improve tolerance to movement building strength and reducing pain  OBJECTIVE IMPAIRMENTS: decreased activity  tolerance, decreased balance, decreased strength, and pain.   ACTIVITY LIMITATIONS: carrying, lifting, bending, sitting, standing, squatting, stairs, transfers, locomotion level, and caring for others  PARTICIPATION LIMITATIONS: meal prep, cleaning, shopping, community activity, occupation, and yard work  PERSONAL FACTORS: Age and Past/current experiences are also affecting patient's functional outcome.   REHAB POTENTIAL: Good  CLINICAL DECISION MAKING: Stable/uncomplicated  EVALUATION COMPLEXITY: Low   GOALS: Goals reviewed with patient? No  SHORT TERM GOALS: Target date: 10/30  Pt will tolerate full aquatic sessions consistently without increase in pain and with improving function to demonstrate good toleration and effectiveness of intervention.  Baseline: Goal status: In progress - 05/17/24;  Met 05/31/24  2.  Pt will report a reduction in pain while submerged by at least 50% to demonstrate using the properties of water  for pain management Baseline:  Goal status: in progress - 05/17/24; Met 05/31/24    LONG TERM GOALS: Target date: 12/6  Pt to improve on ODI by 13% to demonstrate statistically significant Improvement in function. (MCID 13-15%) Baseline: 30/50=60% Goal status: In progress 05/31/24  2.  Pt will report decrease in pain by at least 50% for improved toleration to activity/quality of life and to demonstrate improved management of pain. Baseline: see chart Goal status: In progress 05/31/24  3.  Pt will improve strength in hips and knees within 5 lbs of contralateral side to demonstrate improved overall physical function Baseline: see chart Goal status: INITIAL  4.  Pt will report improved toleration to  activity by 50% Baseline: see chart Goal status: INITIAL  5.  Pt will consider gaining pool access for use of the properties of water  for chronic conditions maintaining mobility and minimizing pain. Baseline: none Goal status: In progress  05/31/24    PLAN:  PT FREQUENCY: 1-2x/week  PT DURATION: 10 weeks extended due to scheduling conflicts  PLANNED INTERVENTIONS: 97164- PT Re-evaluation, 97110-Therapeutic exercises, 97530- Therapeutic activity, 97112- Neuromuscular re-education, 97535- Self Care, 02859- Manual therapy, [phone]- Gait training, Patient/Family education, Balance training, Stair training, Taping, Joint mobilization, and DME instructions.  PLAN FOR NEXT SESSION: aquatic: general strengthening (avoid excessive stretching); balance retraining; pain management  Ronal Foots) Jayvier Burgher MPT 05/31/24 12:35 PM [facility] [address] [place], [place], [address] Phone: [phone]   Fax:  [phone]   For all possible CPT codes, reference the Planned Interventions line above.     Check all conditions that are expected to impact treatment: {Conditions expected to impact treatment:Musculoskeletal disorders   If treatment provided at initial evaluation, no treatment charged due to lack of authorization.

## 2024-06-01 ENCOUNTER — Ambulatory Visit

## 2024-06-01 VITALS — BP 129/89 | HR 70 | Temp 98.0°F | Resp 16 | Ht 65.0 in | Wt 165.8 lb

## 2024-06-01 DIAGNOSIS — G90A Postural orthostatic tachycardia syndrome (POTS): Secondary | ICD-10-CM | POA: Diagnosis not present

## 2024-06-01 MED ORDER — SODIUM CHLORIDE 0.9 % IV BOLUS
250.0000 mL | Freq: Once | INTRAVENOUS | Status: AC
  Administered 2024-06-01: 250 mL via INTRAVENOUS
  Filled 2024-06-01: qty 250

## 2024-06-01 NOTE — Progress Notes (Signed)
 Diagnosis:  POTS (postural orthostatic    Provider:  Praveen Mannam MD  Procedure: IV Infusion  IV Type: Peripheral, IV Location: R Antecubital  Normal Saline, Dose:  Infusion Start Time: 1411  Infusion Stop Time: 1519  Post Infusion IV Care: Peripheral IV Discontinued  Discharge: Condition: Good, Destination: Home . AVS Declined  Performed by:  Rashel Okeefe, RN

## 2024-06-02 ENCOUNTER — Encounter: Payer: Self-pay | Admitting: Physical Medicine and Rehabilitation

## 2024-06-02 ENCOUNTER — Encounter: Admitting: Physical Medicine and Rehabilitation

## 2024-06-02 ENCOUNTER — Other Ambulatory Visit (HOSPITAL_COMMUNITY): Payer: Self-pay

## 2024-06-02 VITALS — BP 132/94 | HR 83 | Wt 164.0 lb

## 2024-06-02 DIAGNOSIS — M7918 Myalgia, other site: Secondary | ICD-10-CM

## 2024-06-02 MED ORDER — LIDOCAINE HCL 1 % IJ SOLN
5.0000 mL | Freq: Once | INTRAMUSCULAR | Status: AC
  Administered 2024-06-02: 5 mL

## 2024-06-02 NOTE — Patient Instructions (Signed)
 [employer]

## 2024-06-02 NOTE — Progress Notes (Signed)

## 2024-06-03 ENCOUNTER — Ambulatory Visit: Payer: Self-pay

## 2024-06-03 ENCOUNTER — Ambulatory Visit (INDEPENDENT_AMBULATORY_CARE_PROVIDER_SITE_OTHER)

## 2024-06-03 VITALS — BP 144/97 | HR 65 | Temp 97.8°F | Resp 18 | Ht 65.0 in | Wt 165.8 lb

## 2024-06-03 DIAGNOSIS — G90A Postural orthostatic tachycardia syndrome (POTS): Secondary | ICD-10-CM

## 2024-06-03 MED ORDER — SODIUM CHLORIDE 0.9 % IV BOLUS (SEPSIS)
250.0000 mL | Freq: Once | INTRAVENOUS | Status: AC
  Administered 2024-06-03: 250 mL via INTRAVENOUS
  Filled 2024-06-03: qty 250

## 2024-06-03 NOTE — Telephone Encounter (Signed)
 FYI Only or Action Required?: FYI only for provider: appointment scheduled on 06/06/24.  Patient was last seen in primary care on 04/21/2024 by Vicci Barnie NOVAK, MD.  Called Nurse Triage reporting Fatigue.  Symptoms began yesterday.  Interventions attempted: Rest, hydration, or home remedies.  Symptoms are: unchanged.  Triage Disposition: See PCP When Office is Open (Within 3 Days)  Patient/caregiver understands and will follow disposition?: Yes            Copied from CRM #[id number]. Topic: Clinical - Red Word Triage >> Jun 03, 2024  4:10 PM Fonda T wrote: [facility] that prompted transfer to Nurse Triage: Pt states she feels a little strange, and feels off for some reason, also reports she is extremely fatigued.  Pt states she got IV Infusion today, and blood pressure is running high for her at 136/96, and again checked it was 141/98.  Pt requesting medical advice and speak to nurse. Reason for Disposition  [1] Systolic BP >= 130 OR Diastolic >= 80 AND [2] taking BP medications  [1] Fatigue (i.e., tires easily, decreased energy) AND [2] persists > 1 week    Pt also endorses hx of POTS and anxiety. Pt initially started call very emotional and acknowledged that anxiety may be playing role in HTN/fatigue. Triager was able to deescalate pt and pt was given strict weekend instructions for monitoring BP.  No access with PCP, scheduled with alternate provider in [facility].  Answer Assessment - Initial Assessment Questions 1. DESCRIPTION: Describe how you are feeling.     Extremely fatigue  2. SEVERITY: How bad is it?  Can you stand and walk?     Still able to walk 3. ONSET: When did these symptoms begin? (e.g., hours, days, weeks, months)     yesterday 4. CAUSE: What do you think is causing the weakness or fatigue? (e.g., not drinking enough fluids, medical problem, trouble sleeping)     unknown 5. NEW MEDICINES:  Have you started on any new medicines  recently? (e.g., opioid pain medicines, benzodiazepines, muscle relaxants, antidepressants, antihistamines, neuroleptics, beta blockers)     denies 6. OTHER SYMPTOMS: Do you have any other symptoms? (e.g., chest pain, fever, cough, SOB, vomiting, diarrhea, bleeding, other areas of pain)     HTN - endorses receives IV sodium chloride  2x weekly, and [facility] 7. PREGNANCY: Is there any chance you are pregnant? When was your last menstrual period?     N/a  Answer Assessment - Initial Assessment Questions 1. BLOOD PRESSURE: What is your blood pressure? Did you take at least two measurements 5 minutes apart?      144/97 at infusion clinic 2. ONSET: When did you take your blood pressure?     Before call 3. HOW: How did you take your blood pressure? (e.g., automatic home BP monitor, visiting nurse)     Nurse in infusion clinic 4. HISTORY: Do you have a history of high blood pressure?     denies 5. MEDICINES: Are you taking any medicines for blood pressure? Have you missed any doses recently?     Endorses was on amlodipine  - recently discontinued and started on propanolol 6. OTHER SYMPTOMS: Do you have any symptoms? (e.g., blurred vision, chest pain, difficulty breathing, headache, weakness)     weakness 7. PREGNANCY: Is there any chance you are pregnant? When was your last menstrual period?     N/a  Protocols used: Weakness (Generalized) and Fatigue-A-AH, Blood Pressure - High-A-AH

## 2024-06-03 NOTE — Progress Notes (Signed)
 Diagnosis: POTS  Provider:  Mannam, Praveen MD  Procedure: IV Infusion  IV Type: Peripheral, IV Location: R Antecubital  Normal Saline, Dose: 250 ml  Infusion Start Time: 1456  Infusion Stop Time: 1610  Post Infusion IV Care: Peripheral IV Discontinued  Discharge: Condition: Good, Destination: Home . AVS Declined  Performed by:  Rocky FORBES Sar, RN

## 2024-06-06 ENCOUNTER — Ambulatory Visit: Admitting: Family Medicine

## 2024-06-06 ENCOUNTER — Encounter: Payer: Self-pay | Admitting: Family Medicine

## 2024-06-06 VITALS — BP 128/89 | HR 119 | Ht 65.0 in | Wt 163.0 lb

## 2024-06-06 DIAGNOSIS — F32A Depression, unspecified: Secondary | ICD-10-CM

## 2024-06-06 DIAGNOSIS — R000 Tachycardia, unspecified: Secondary | ICD-10-CM

## 2024-06-06 DIAGNOSIS — F419 Anxiety disorder, unspecified: Secondary | ICD-10-CM

## 2024-06-06 DIAGNOSIS — R5383 Other fatigue: Secondary | ICD-10-CM | POA: Diagnosis not present

## 2024-06-06 NOTE — Progress Notes (Unsigned)
 Established Patient Office Visit  Subjective    Patient ID: Laurie Casey, female    DOB: [birth year]  Age: 38 y.o. MRN: [id number]  CC:  Chief Complaint  Patient presents with   Hypertension    Pt reports she gets infusions twice a week and last week she was told by the infusion nurse to call PCP due to hypertension     HPI Laurie Casey presents with complaint of persistent fatigue and also episodes of hypertension which was noted during a recent IVF transfusion.  Outpatient Encounter Medications as of 06/06/2024  Medication Sig   buPROPion (WELLBUTRIN SR) 200 MG 12 hr tablet Take 200 mg by mouth 2 (two) times daily.   DULoxetine (CYMBALTA) 30 MG capsule Take 30 mg by mouth 2 (two) times daily.   EPINEPHrine  0.3 mg/0.3 mL IJ SOAJ injection Inject 0.3 mg into the muscle as needed for anaphylaxis.   estradiol  (VIVELLE -DOT) 0.05 MG/24HR patch Place 1 patch (0.05 mg total) onto the skin 2 (two) times a week.   famotidine  (PEPCID ) 20 MG tablet Take 1 tablet (20 mg total) by mouth 2 (two) times daily as needed for heartburn or indigestion.   Multiple Vitamin (MULTIVITAMIN) tablet Take 1 tablet by mouth daily.   NALTREXONE HCL, PAIN, PO    prazosin  (MINIPRESS ) 1 MG capsule Take 1 capsule (1 mg total) by mouth at bedtime.   pregabalin  (LYRICA ) 50 MG capsule Take 1 capsule (50 mg total) by mouth at bedtime.   propranolol  (INDERAL ) 10 MG tablet Take 1 tablet (10 mg total) by mouth daily.   tiZANidine  (ZANAFLEX ) 2 MG tablet Take 1 tablet (2 mg total) by mouth 2 (two) times daily as needed for muscle spasms.   topiramate  (TOPAMAX ) 25 MG tablet Take 1 tablet (25 mg total) by mouth 3 (three) times daily as needed.   XANAX  0.25 MG tablet Take 0.25 mg by mouth as needed.   amLODipine  (NORVASC ) 10 MG tablet Take 1 tablet (10 mg total) by mouth daily.   atomoxetine  (STRATTERA ) 10 MG capsule Take 1 capsule (10 mg total) by mouth daily.   buPROPion (WELLBUTRIN XL) 300 MG 24 hr tablet Take 300  mg by mouth every morning. (Patient taking differently: Take 200 mg by mouth in the morning and at bedtime.)   No facility-administered encounter medications on file as of 06/06/2024.    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   Pre-eclampsia    PTSD (post-traumatic stress disorder) 06/30/2022    Past Surgical History:  Procedure Laterality Date   CESAREAN SECTION  2013, 2015, 2019   x2   CESAREAN SECTION  03/24/2023   with partial historectomy with uterus, cervix and tube removal   CYSTOSCOPY  2008 or 2009   CYSTOSCOPY W/ URETERAL STENT PLACEMENT Right 11/29/2020   Procedure: CYSTOSCOPY WITH RETROGRADE PYELOGRAM/URETERAL STENT PLACEMENT;  Surgeon: Matilda Senior, MD;  Location: [facility] OR;  Service: Urology;  Laterality: Right;   CYSTOSCOPY/URETEROSCOPY/HOLMIUM LASER/STENT PLACEMENT Right 12/13/2020   Procedure: CYSTOSCOPY RIGHT URETEROSCOPY/HOLMIUM LASER/STENT EXTRACTION AND RIGHT JJ STENT PLACEMENT, RIGHT RETROGRADE URETEROSCOPY;  Surgeon: Matilda Senior, MD;  Location: [facility];  Service: Urology;  Laterality: Right;   URETER SURGERY  x2   WISDOM TOOTH EXTRACTION      Family History  Problem Relation Age of Onset   Hypertension Father     Social History   Socioeconomic History   Marital status: Significant Other    Spouse name: Not on file   Number of children: 3   Years of education: Not on file   Highest education level: Associate degree: occupational, scientist, product/process development, or vocational program  Occupational History   Occupation: Vet tech  Tobacco Use   Smoking status: Former    Current packs/day: 0.00    Types: Cigarettes    Quit date: 10/12/2020    Years since quitting: 3.6   Smokeless  tobacco: Never   Tobacco comments:    off and on   Vaping Use   Vaping status: Never Used  Substance and Sexual Activity   Alcohol use: Not Currently   Drug use: Never   Sexual activity: Not on file  Other Topics Concern   Not on file  Social History Narrative   Not on file   Social Drivers of Health   Financial Resource Strain: Low Risk  (06/05/2024)   Overall Financial Resource Strain (CARDIA)    Difficulty of Paying Living Expenses: Not very hard  Food Insecurity: No Food Insecurity (06/05/2024)   Hunger Vital Sign    Worried About Running Out of Food in the Last Year: Never true    Ran Out of Food in the Last Year: Never true  Transportation Needs: No Transportation Needs (06/05/2024)   PRAPARE - Administrator, Civil Service (Medical): No    Lack of Transportation (Non-Medical): No  Physical Activity: Inactive (06/05/2024)   Exercise Vital Sign    Days of Exercise per Week: 0 days    Minutes of Exercise per Session: Not on file  Stress: Stress Concern Present (06/05/2024)   [employer] of Occupational Health - Occupational Stress Questionnaire    Feeling of Stress: Very much  Social Connections: Socially Isolated (06/05/2024)   Social Connection and Isolation Panel    Frequency of Communication with Friends and Family: Never    Frequency of Social Gatherings with Friends and Family: Never    Attends Religious Services: Never    Database Administrator or Organizations: No    Attends Engineer, Structural: Not on file    Marital Status: Divorced  Intimate Partner Violence: Not At Risk (07/23/2023)   Humiliation, Afraid, Rape, and Kick questionnaire    Fear of Current or Ex-Partner: No    Emotionally Abused: No    Physically Abused: No    Sexually Abused: No    Review of Systems  All other systems reviewed and are negative.       Objective    BP 128/89   Pulse (!) 119   Ht 5' 5 (1.651 m)   Wt 163 lb (73.9 kg)   LMP 07/19/2022  Comment: Had baby in September  SpO2 97%   BMI 27.12 kg/m   Physical Exam Vitals and nursing note reviewed.  Constitutional:      General: She is not in acute distress. Cardiovascular:     Rate and Rhythm: Regular rhythm. Tachycardia present.  Pulmonary:     Effort: Pulmonary effort is normal.     Breath sounds: Normal breath sounds.  Neurological:     General: No focal deficit present.     Mental Status: She is alert and oriented to person, place, and time.     {Labs (  Optional):23779}    Assessment & Plan:   Other fatigue  Tachycardia     No follow-ups on file.   Tanda Raguel SQUIBB, MD

## 2024-06-07 ENCOUNTER — Ambulatory Visit (HOSPITAL_BASED_OUTPATIENT_CLINIC_OR_DEPARTMENT_OTHER): Admitting: Physical Therapy

## 2024-06-07 ENCOUNTER — Encounter (HOSPITAL_BASED_OUTPATIENT_CLINIC_OR_DEPARTMENT_OTHER): Payer: Self-pay | Admitting: Physical Therapy

## 2024-06-07 ENCOUNTER — Encounter: Payer: Self-pay | Admitting: Family Medicine

## 2024-06-07 DIAGNOSIS — R2681 Unsteadiness on feet: Secondary | ICD-10-CM

## 2024-06-07 DIAGNOSIS — M6281 Muscle weakness (generalized): Secondary | ICD-10-CM

## 2024-06-07 DIAGNOSIS — M5459 Other low back pain: Secondary | ICD-10-CM

## 2024-06-07 LAB — EHLERS-DANLOS SYNDROME PANEL

## 2024-06-07 NOTE — Therapy (Signed)
 OUTPATIENT PHYSICAL THERAPY THORACOLUMBAR TREATMENT   Patient Name: Laurie Casey MRN: [id number] DOB:01-17-1986, 38 y.o., female Today's Date: 06/07/2024  END OF SESSION:  PT End of Session - 06/07/24 1222     Visit Number 6    Number of Visits 12    Date for Recertification  06/18/24    Authorization Type [employer] med[employer]    Authorization Time Period visit #1/5 visits approved  From 11.25.2025 - 01.23.2026    Authorization - Visit Number 1    Authorization - Number of Visits 5    PT Start Time 1146    PT Stop Time 1230    PT Time Calculation (min) 44 min    Activity Tolerance Patient tolerated treatment well    Behavior During Therapy [facility] for tasks assessed/performed            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   Pre-eclampsia    PTSD (post-traumatic stress disorder) 06/30/2022   Past Surgical History:  Procedure Laterality Date   CESAREAN SECTION  2013, 2015, 2019   x2   CESAREAN SECTION  03/24/2023   with partial historectomy with uterus, cervix and tube removal   CYSTOSCOPY  2008 or 2009   CYSTOSCOPY W/ URETERAL STENT PLACEMENT Right 11/29/2020   Procedure: CYSTOSCOPY WITH RETROGRADE PYELOGRAM/URETERAL STENT PLACEMENT;  Surgeon: Matilda Senior, MD;  Location: [facility] OR;  Service: Urology;  Laterality: Right;   CYSTOSCOPY/URETEROSCOPY/HOLMIUM LASER/STENT PLACEMENT Right 12/13/2020   Procedure: CYSTOSCOPY RIGHT URETEROSCOPY/HOLMIUM LASER/STENT EXTRACTION AND RIGHT JJ STENT PLACEMENT, RIGHT RETROGRADE URETEROSCOPY;  Surgeon: Matilda Senior, MD;  Location: [facility];  Service: Urology;  Laterality: Right;   URETER SURGERY     x2   WISDOM TOOTH EXTRACTION     Patient  Active Problem List   Diagnosis Date Noted   POTS (postural orthostatic tachycardia syndrome) 05/17/2024   Placenta accreta 12/03/2022   History of cesarean section 10/01/2022   Essential hypertension 08/11/2022   Obesity (BMI 30.0-34.9) 08/11/2022   High risk multigravida in third trimester 09/10/2017   Hx of preeclampsia, prior pregnancy, currently pregnant 03/16/2014    PCP: Barnie Louder MD  REFERRING PROVIDER:   Lorilee Sven SQUIBB, MD    REFERRING DIAG: M79.7 (ICD-10-CM) - Fibromyalgia   Rationale for Evaluation and Treatment: Rehabilitation  THERAPY DIAG:  Other low back pain  Muscle weakness (generalized)  Unsteadiness on feet  ONSET DATE: chronic  SUBJECTIVE:  SUBJECTIVE STATEMENT: Pain 7/10. Bad night legs hurting   Initial evaluation:  Fall in June hurt left ankle.  Having a hard time healing.  Ortho put me on a heavy boot for 4 months and it made my back hurt and made ankle worse.  I am in constant pain with my fibro.  Worst in my back.  Get trigger point injection in my back, a lot of joint pain. It feels hot.  Hard to explain. Have a 1 yr old/4 children. We are selling and buying a house. (External stress). Pain interrupts sleep >3 x night. Had abdominal surgery in Sept 2024 and have no core strength  PERTINENT HISTORY:  fibromyalgia  PAIN:  Are you having pain? Yes: NPRS scale: current 3/10 generalized; 6/10 shoulders Pain location: see above Pain description: tense, sharp and dull Aggravating factors: stress Relieving factors: meds; heat; TENs unit, pressure point mat  PRECAUTIONS: None  RED FLAGS: None   WEIGHT BEARING RESTRICTIONS: No  FALLS:  Has patient fallen in last 6 months? Yes 2: fell down stairs; fell in shower  LIVING ENVIRONMENT: Lives with:  lives with their family Lives in: House/apartment Stairs: No Has following equipment at home: None  OCCUPATION: stay at home Mom  PLOF: Independent  PATIENT GOALS: increase toleration to movement; get back to exercise  NEXT MD VISIT: intermittent  OBJECTIVE:  Note: Objective measures were completed at Evaluation unless otherwise noted.   PATIENT SURVEYS:  ODI  COGNITION: Overall cognitive status: Within functional limits for tasks assessed       POSTURE: increased thoracic kyphosis/ hypermobile   LUMBAR ROM:   hypermobile  LOWER EXTREMITY ROM:     Active  Right eval Left eval  Hip flexion    Hip extension    Hip abduction    Hip adduction    Hip internal rotation    Hip external rotation    Knee flexion    Knee extension hyper hyper  Ankle dorsiflexion    Ankle plantarflexion    Ankle inversion    Ankle eversion     (Blank rows = not tested)  LOWER EXTREMITY MMT:     Tested in sitting HD lbs Right eval Left eval  Hip flexion 29.1 21.9  Hip extension    Hip abduction 18.5 17.1  Hip adduction    Hip internal rotation    Hip external rotation    Knee flexion    Knee extension 12.8 22.0  Ankle dorsiflexion    Ankle plantarflexion    Ankle inversion    Ankle eversion     (Blank rows = not tested)   FUNCTIONAL TESTS:  Timed up and go (TUG): 14.17   4 stage balance GAIT: Distance walked: 400 ft Assistive device utilized: None Level of assistance: Complete Independence Comments: antalgic upon initiation of gait  TREATMENT  [facility] Adult PT Treatment:                                             Date: 06/07/24 Pt seen for aquatic therapy today.  Treatment took place in water  3.5-4.75 ft in depth at the [facility] pool. Temp of water  was 91.  Pt entered/exited the pool via stairs independently with bil rail.  - decompression using noodle wrapped posteriorly-> between legs: cycling; hip add/abd; hip flexion/extension - with light  resistance bells: walking forward with reciprocal arm swing;  side stepping with arm add/abdct ;squatted rest; marching forward with row motion  - UE on wall:  toe/heel raises x10; hip add/abd x10 ; hip extension to toe touch x 5 each; alternating hamstring curls x 5 each - cycling on noodle   Pt requires the buoyancy and hydrostatic pressure of water  for support, and to offload joints by unweighting joint load by at least 50 % in navel deep water  and by at least 75-80% in chest to neck deep water .  Viscosity of the water  is needed for resistance of strengthening. Water  current perturbations provides challenge to standing balance requiring increased core activation.   PATIENT EDUCATION:  Education details: intro to aquatic therapy  Person educated: Patient Education method: Explanation Education comprehension: verbalized understanding  HOME EXERCISE PROGRAM: Aquatic tba  ASSESSMENT:  CLINICAL IMPRESSION: Good response to last session. She is directed through general movement patterns to encourage strengthening and pain reduction. Good toleration, pain reduction throughout session while submerged.  Goals ongoing      Initial evaluation:  Patient is a 38 y.o. f who was seen today for physical therapy evaluation and treatment for fibromyalgia. Patient presents with pain limited deficits throughout extremities and axial spine effecting endurance, activity tolerance, gait, balance, and functional mobility with ADL's. She is having to modify and restrict ADL's as per subjective information and objective measures as well as outcome measure score (ODI 30/50=60%).  She will benefit from skilled PT intervention in aquatics using the properties of water  to improve tolerance to movement building strength and reducing pain  OBJECTIVE IMPAIRMENTS: decreased activity tolerance, decreased balance, decreased strength, and pain.   ACTIVITY LIMITATIONS: carrying, lifting, bending, sitting, standing,  squatting, stairs, transfers, locomotion level, and caring for others  PARTICIPATION LIMITATIONS: meal prep, cleaning, shopping, community activity, occupation, and yard work  PERSONAL FACTORS: Age and Past/current experiences are also affecting patient's functional outcome.   REHAB POTENTIAL: Good  CLINICAL DECISION MAKING: Stable/uncomplicated  EVALUATION COMPLEXITY: Low   GOALS: Goals reviewed with patient? No  SHORT TERM GOALS: Target date: 10/30  Pt will tolerate full aquatic sessions consistently without increase in pain and with improving function to demonstrate good toleration and effectiveness of intervention.  Baseline: Goal status: In progress - 05/17/24;  Met 05/31/24  2.  Pt will report a reduction in pain while submerged by at least 50% to demonstrate using the properties of water  for pain management Baseline:  Goal status: in progress - 05/17/24; Met 05/31/24    LONG TERM GOALS: Target date: 12/6  Pt to improve on ODI by 13% to demonstrate statistically significant Improvement in function. (MCID 13-15%) Baseline: 30/50=60% Goal status: In progress 05/31/24  2.  Pt will report decrease in pain by at least 50% for improved toleration to activity/quality of life and to demonstrate improved management of pain. Baseline: see chart Goal status: In progress 05/31/24  3.  Pt will improve strength in hips and knees within 5 lbs of contralateral side to demonstrate improved overall physical function Baseline: see chart Goal status: INITIAL  4.  Pt will report improved toleration to activity by 50% Baseline: see chart Goal status: INITIAL  5.  Pt will consider gaining pool access for use of the properties of water  for chronic conditions maintaining mobility and minimizing pain. Baseline: none Goal status: In progress 05/31/24    PLAN:  PT FREQUENCY: 1-2x/week  PT DURATION: 10 weeks extended due to scheduling conflicts  PLANNED INTERVENTIONS: 97164- PT  Re-evaluation, 97110-Therapeutic exercises, 97530- Therapeutic activity, 97112- Neuromuscular  re-education, [phone]- Self Care, 02859- Manual therapy, [phone]- Gait training, Patient/Family education, Balance training, Stair training, Taping, Joint mobilization, and DME instructions.  PLAN FOR NEXT SESSION: aquatic: general strengthening (avoid excessive stretching); balance retraining; pain management  Ronal Foots) Dayanira Giovannetti MPT 06/07/24 12:34 PM [facility] [address] [place], [place], [address] Phone: [phone]   Fax:  [phone]   For all possible CPT codes, reference the Planned Interventions line above.     Check all conditions that are expected to impact treatment: {Conditions expected to impact treatment:Musculoskeletal disorders   If treatment provided at initial evaluation, no treatment charged due to lack of authorization.

## 2024-06-08 ENCOUNTER — Ambulatory Visit (INDEPENDENT_AMBULATORY_CARE_PROVIDER_SITE_OTHER)

## 2024-06-08 VITALS — BP 131/91 | HR 76 | Temp 97.8°F | Resp 16 | Ht 65.0 in | Wt 161.8 lb

## 2024-06-08 DIAGNOSIS — G90A Postural orthostatic tachycardia syndrome (POTS): Secondary | ICD-10-CM

## 2024-06-08 DIAGNOSIS — F322 Major depressive disorder, single episode, severe without psychotic features: Secondary | ICD-10-CM | POA: Diagnosis not present

## 2024-06-08 DIAGNOSIS — F4310 Post-traumatic stress disorder, unspecified: Secondary | ICD-10-CM | POA: Diagnosis not present

## 2024-06-08 MED ORDER — SODIUM CHLORIDE 0.9 % IV BOLUS (SEPSIS)
250.0000 mL | Freq: Once | INTRAVENOUS | Status: AC
  Administered 2024-06-08: 250 mL via INTRAVENOUS
  Filled 2024-06-08: qty 250

## 2024-06-08 NOTE — Progress Notes (Signed)
 Diagnosis: POTS ( postural orthostatic tachycardia syndrome )   Provider:  Lonna Coder MD  Procedure: IV Infusion  IV Type: Peripheral, IV Location: R Antecubital  Normal Saline, Dose: 250 ml  Infusion Start Time: 1356  Infusion Stop Time: 1458  Post Infusion IV Care: Peripheral IV Discontinued  Discharge: Condition: Good, Destination: Home . AVS Declined  Performed by:  Ariely Riddell G [place], RN

## 2024-06-14 ENCOUNTER — Encounter: Payer: Self-pay | Admitting: *Deleted

## 2024-06-14 ENCOUNTER — Ambulatory Visit (INDEPENDENT_AMBULATORY_CARE_PROVIDER_SITE_OTHER)

## 2024-06-14 ENCOUNTER — Encounter (HOSPITAL_BASED_OUTPATIENT_CLINIC_OR_DEPARTMENT_OTHER): Payer: Self-pay | Admitting: Physical Therapy

## 2024-06-14 ENCOUNTER — Ambulatory Visit (HOSPITAL_BASED_OUTPATIENT_CLINIC_OR_DEPARTMENT_OTHER): Attending: Physical Medicine and Rehabilitation | Admitting: Physical Therapy

## 2024-06-14 VITALS — BP 127/87 | HR 68 | Temp 98.1°F | Resp 16 | Ht 65.0 in | Wt 164.6 lb

## 2024-06-14 DIAGNOSIS — G90A Postural orthostatic tachycardia syndrome (POTS): Secondary | ICD-10-CM | POA: Diagnosis not present

## 2024-06-14 DIAGNOSIS — M6281 Muscle weakness (generalized): Secondary | ICD-10-CM | POA: Diagnosis present

## 2024-06-14 DIAGNOSIS — R2681 Unsteadiness on feet: Secondary | ICD-10-CM | POA: Insufficient documentation

## 2024-06-14 DIAGNOSIS — M797 Fibromyalgia: Secondary | ICD-10-CM | POA: Insufficient documentation

## 2024-06-14 DIAGNOSIS — M5459 Other low back pain: Secondary | ICD-10-CM | POA: Insufficient documentation

## 2024-06-14 MED ORDER — SODIUM CHLORIDE 0.9 % IV BOLUS (SEPSIS)
250.0000 mL | Freq: Once | INTRAVENOUS | Status: AC
  Administered 2024-06-14: 250 mL via INTRAVENOUS
  Filled 2024-06-14: qty 250

## 2024-06-14 NOTE — Progress Notes (Signed)
 Diagnosis: Dehydration  Provider:  Praveen Mannam MD  Procedure: IV Infusion  IV Type: Peripheral, IV Location: R Antecubital   Normal Saline, Dose: 250 ml  Infusion Start Time: 1425  Infusion Stop Time: 1535  Post Infusion IV Care: Peripheral IV Discontinued  Discharge: Condition: Good, Destination: Home . AVS Declined  Performed by:  Leita FORBES Miles, LPN

## 2024-06-14 NOTE — Therapy (Signed)
 OUTPATIENT PHYSICAL THERAPY THORACOLUMBAR TREATMENT   Patient Name: Laurie Casey MRN: [id number] DOB:1986/04/27, 38 y.o., female Today's Date: 06/14/2024  END OF SESSION:  PT End of Session - 06/14/24 1203     Visit Number 7    Number of Visits 12    Date for Recertification  06/18/24    Authorization Type [employer] med[employer]    Authorization Time Period visit #1/5 visits approved  From 11.25.2025 - 01.23.2026    Authorization - Visit Number 2    Authorization - Number of Visits 5    PT Start Time 1150    PT Stop Time 1228    PT Time Calculation (min) 38 min    Activity Tolerance Patient tolerated treatment well    Behavior During Therapy [facility] for tasks assessed/performed             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   Pre-eclampsia    PTSD (post-traumatic stress disorder) 06/30/2022   Past Surgical History:  Procedure Laterality Date   CESAREAN SECTION  2013, 2015, 2019   x2   CESAREAN SECTION  03/24/2023   with partial historectomy with uterus, cervix and tube removal   CYSTOSCOPY  2008 or 2009   CYSTOSCOPY W/ URETERAL STENT PLACEMENT Right 11/29/2020   Procedure: CYSTOSCOPY WITH RETROGRADE PYELOGRAM/URETERAL STENT PLACEMENT;  Surgeon: Matilda Senior, MD;  Location: [facility] OR;  Service: Urology;  Laterality: Right;   CYSTOSCOPY/URETEROSCOPY/HOLMIUM LASER/STENT PLACEMENT Right 12/13/2020   Procedure: CYSTOSCOPY RIGHT URETEROSCOPY/HOLMIUM LASER/STENT EXTRACTION AND RIGHT JJ STENT PLACEMENT, RIGHT RETROGRADE URETEROSCOPY;  Surgeon: Matilda Senior, MD;  Location: [facility];  Service: Urology;  Laterality: Right;   URETER SURGERY     x2   WISDOM TOOTH EXTRACTION     Patient  Active Problem List   Diagnosis Date Noted   POTS (postural orthostatic tachycardia syndrome) 05/17/2024   Placenta accreta 12/03/2022   History of cesarean section 10/01/2022   Essential hypertension 08/11/2022   Obesity (BMI 30.0-34.9) 08/11/2022   High risk multigravida in third trimester 09/10/2017   Hx of preeclampsia, prior pregnancy, currently pregnant 03/16/2014    PCP: Barnie Louder MD  REFERRING PROVIDER:   Lorilee Sven SQUIBB, MD    REFERRING DIAG: M79.7 (ICD-10-CM) - Fibromyalgia   Rationale for Evaluation and Treatment: Rehabilitation  THERAPY DIAG:  Other low back pain  Muscle weakness (generalized)  Unsteadiness on feet  Fibromyalgia  ONSET DATE: chronic  SUBJECTIVE:  SUBJECTIVE STATEMENT: Pain 8/10. Bad night again   Initial evaluation:  Fall in June hurt left ankle.  Having a hard time healing.  Ortho put me on a heavy boot for 4 months and it made my back hurt and made ankle worse.  I am in constant pain with my fibro.  Worst in my back.  Get trigger point injection in my back, a lot of joint pain. It feels hot.  Hard to explain. Have a 1 yr old/4 children. We are selling and buying a house. (External stress). Pain interrupts sleep >3 x night. Had abdominal surgery in Sept 2024 and have no core strength  PERTINENT HISTORY:  fibromyalgia  PAIN:  Are you having pain? Yes: NPRS scale: current 3/10 generalized; 6/10 shoulders Pain location: see above Pain description: tense, sharp and dull Aggravating factors: stress Relieving factors: meds; heat; TENs unit, pressure point mat  PRECAUTIONS: None  RED FLAGS: None   WEIGHT BEARING RESTRICTIONS: No  FALLS:  Has patient fallen in last 6 months? Yes 2: fell down stairs; fell in shower  LIVING ENVIRONMENT: Lives  with: lives with their family Lives in: House/apartment Stairs: No Has following equipment at home: None  OCCUPATION: stay at home Mom  PLOF: Independent  PATIENT GOALS: increase toleration to movement; get back to exercise  NEXT MD VISIT: intermittent  OBJECTIVE:  Note: Objective measures were completed at Evaluation unless otherwise noted.   PATIENT SURVEYS:  ODI  COGNITION: Overall cognitive status: Within functional limits for tasks assessed       POSTURE: increased thoracic kyphosis/ hypermobile   LUMBAR ROM:   hypermobile  LOWER EXTREMITY ROM:     Active  Right eval Left eval  Hip flexion    Hip extension    Hip abduction    Hip adduction    Hip internal rotation    Hip external rotation    Knee flexion    Knee extension hyper hyper  Ankle dorsiflexion    Ankle plantarflexion    Ankle inversion    Ankle eversion     (Blank rows = not tested)  LOWER EXTREMITY MMT:     Tested in sitting HD lbs Right eval Left eval  Hip flexion 29.1 21.9  Hip extension    Hip abduction 18.5 17.1  Hip adduction    Hip internal rotation    Hip external rotation    Knee flexion    Knee extension 12.8 22.0  Ankle dorsiflexion    Ankle plantarflexion    Ankle inversion    Ankle eversion     (Blank rows = not tested)   FUNCTIONAL TESTS:  Timed up and go (TUG): 14.17   4 stage balance GAIT: Distance walked: 400 ft Assistive device utilized: None Level of assistance: Complete Independence Comments: antalgic upon initiation of gait  TREATMENT  [facility] Adult PT Treatment:                                             Date: 06/14/24 Pt seen for aquatic therapy today.  Treatment took place in water  3.5-4.75 ft in depth at the [facility] pool. Temp of water  was 91.  Pt entered/exited the pool via stairs independently with bil rail.  - decompression using noodle wrapped posteriorly-> between legs: cycling; hip add/abd; hip flexion/extension - with light  resistance bells: walking forward with reciprocal arm swing; side  stepping with arm add/abdct:squatted rest; marching forward with row motion  - UE on wall:  toe/heel raises x10; hip add/abd x10 ; hip extension to toe touch x 5 each; alternating hamstring curls x 5 each - cycling on noodle   Pt requires the buoyancy and hydrostatic pressure of water  for support, and to offload joints by unweighting joint load by at least 50 % in navel deep water  and by at least 75-80% in chest to neck deep water .  Viscosity of the water  is needed for resistance of strengthening. Water  current perturbations provides challenge to standing balance requiring increased core activation.   PATIENT EDUCATION:  Education details: intro to aquatic therapy  Person educated: Patient Education method: Explanation Education comprehension: verbalized understanding  HOME EXERCISE PROGRAM: Aquatic tba  ASSESSMENT:  CLINICAL IMPRESSION: Increased pain sensitivity today pt reports due to being out of her [place] . Pt has reduction in pain with decompression on noodle and gentle le movement patterns. Pt reduction as session progresses 4/10.  Pt reports good response from last session with a few days of less stiffness. Goals ongoing       Initial evaluation:  Patient is a 38 y.o. f who was seen today for physical therapy evaluation and treatment for fibromyalgia. Patient presents with pain limited deficits throughout extremities and axial spine effecting endurance, activity tolerance, gait, balance, and functional mobility with ADL's. She is having to modify and restrict ADL's as per subjective information and objective measures as well as outcome measure score (ODI 30/50=60%).  She will benefit from skilled PT intervention in aquatics using the properties of water  to improve tolerance to movement building strength and reducing pain  OBJECTIVE IMPAIRMENTS: decreased activity tolerance, decreased balance, decreased strength,  and pain.   ACTIVITY LIMITATIONS: carrying, lifting, bending, sitting, standing, squatting, stairs, transfers, locomotion level, and caring for others  PARTICIPATION LIMITATIONS: meal prep, cleaning, shopping, community activity, occupation, and yard work  PERSONAL FACTORS: Age and Past/current experiences are also affecting patient's functional outcome.   REHAB POTENTIAL: Good  CLINICAL DECISION MAKING: Stable/uncomplicated  EVALUATION COMPLEXITY: Low   GOALS: Goals reviewed with patient? No  SHORT TERM GOALS: Target date: 10/30  Pt will tolerate full aquatic sessions consistently without increase in pain and with improving function to demonstrate good toleration and effectiveness of intervention.  Baseline: Goal status: In progress - 05/17/24;  Met 05/31/24  2.  Pt will report a reduction in pain while submerged by at least 50% to demonstrate using the properties of water  for pain management Baseline:  Goal status: in progress - 05/17/24; Met 05/31/24    LONG TERM GOALS: Target date: 12/6  Pt to improve on ODI by 13% to demonstrate statistically significant Improvement in function. (MCID 13-15%) Baseline: 30/50=60% Goal status: In progress 05/31/24  2.  Pt will report decrease in pain by at least 50% for improved toleration to activity/quality of life and to demonstrate improved management of pain. Baseline: see chart Goal status: In progress 05/31/24  3.  Pt will improve strength in hips and knees within 5 lbs of contralateral side to demonstrate improved overall physical function Baseline: see chart Goal status: INITIAL  4.  Pt will report improved toleration to activity by 50% Baseline: see chart Goal status: INITIAL  5.  Pt will consider gaining pool access for use of the properties of water  for chronic conditions maintaining mobility and minimizing pain. Baseline: none Goal status: In progress 05/31/24    PLAN:  PT FREQUENCY: 1-2x/week  PT DURATION: 10  weeks extended due to scheduling conflicts  PLANNED INTERVENTIONS: 97164- PT Re-evaluation, 97110-Therapeutic exercises, 97530- Therapeutic activity, 97112- Neuromuscular re-education, 97535- Self Care, 02859- Manual therapy, [phone]- Gait training, Patient/Family education, Balance training, Stair training, Taping, Joint mobilization, and DME instructions.  PLAN FOR NEXT SESSION: aquatic: general strengthening (avoid excessive stretching); balance retraining; pain management  Ronal Foots) Jolita Haefner MPT 06/14/24 1:05 PM [facility] [address] [place], [place], [address] Phone: [phone]   Fax:  [phone]   For all possible CPT codes, reference the Planned Interventions line above.     Check all conditions that are expected to impact treatment: {Conditions expected to impact treatment:Musculoskeletal disorders   If treatment provided at initial evaluation, no treatment charged due to lack of authorization.

## 2024-06-15 DIAGNOSIS — F4310 Post-traumatic stress disorder, unspecified: Secondary | ICD-10-CM | POA: Diagnosis not present

## 2024-06-15 DIAGNOSIS — F322 Major depressive disorder, single episode, severe without psychotic features: Secondary | ICD-10-CM | POA: Diagnosis not present

## 2024-06-16 ENCOUNTER — Ambulatory Visit

## 2024-06-16 ENCOUNTER — Encounter: Payer: Self-pay | Admitting: Cardiology

## 2024-06-16 ENCOUNTER — Ambulatory Visit: Attending: Cardiology | Admitting: Cardiology

## 2024-06-16 VITALS — BP 128/100 | HR 87 | Ht 65.0 in | Wt 168.8 lb

## 2024-06-16 DIAGNOSIS — E785 Hyperlipidemia, unspecified: Secondary | ICD-10-CM | POA: Diagnosis not present

## 2024-06-16 DIAGNOSIS — E559 Vitamin D deficiency, unspecified: Secondary | ICD-10-CM | POA: Diagnosis not present

## 2024-06-16 DIAGNOSIS — I119 Hypertensive heart disease without heart failure: Secondary | ICD-10-CM | POA: Insufficient documentation

## 2024-06-16 DIAGNOSIS — R002 Palpitations: Secondary | ICD-10-CM

## 2024-06-16 MED ORDER — AMLODIPINE BESYLATE 5 MG PO TABS: 5.0000 mg | ORAL_TABLET | Freq: Every day | ORAL | 3 refills | Status: DC

## 2024-06-16 NOTE — Progress Notes (Signed)
 Cardiology Office Note:    Date:  06/16/2024   ID:  Laurie Casey, DOB 07/13/1986, MRN [id number]  PCP:  Vicci Barnie NOVAK, MD  Cardiologist:  Dub Huntsman, DO  Electrophysiologist:  None   Referring MD: Vicci Barnie NOVAK, MD   I am having palpitations  History of Present Illness:    Laurie Casey is a 38 y.o. female with a hx of GAD, PTSD, MDD, she was recently seen in our office for postural orthostatic tachycardia syndrome.  I do see she had orthostatic vitals done which were not quite impressive.  Today she tells me since her last visit her amlodipine  was stopped by her PCP and propranolol  10 mg was started.  Her blood pressure is elevated and she still have palpitations.   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   Pre-eclampsia    PTSD (post-traumatic stress disorder) 06/30/2022    Past Surgical History:  Procedure Laterality Date   CESAREAN SECTION  2013, 2015, 2019   x2   CESAREAN SECTION  03/24/2023   with partial historectomy with uterus, cervix and tube removal   CYSTOSCOPY  2008 or 2009   CYSTOSCOPY W/ URETERAL STENT PLACEMENT Right 11/29/2020   Procedure: CYSTOSCOPY WITH RETROGRADE PYELOGRAM/URETERAL STENT PLACEMENT;  Surgeon: Matilda Senior, MD;  Location: [facility] OR;  Service: Urology;  Laterality: Right;   CYSTOSCOPY/URETEROSCOPY/HOLMIUM LASER/STENT PLACEMENT Right 12/13/2020   Procedure: CYSTOSCOPY RIGHT URETEROSCOPY/HOLMIUM LASER/STENT EXTRACTION AND RIGHT JJ STENT PLACEMENT, RIGHT RETROGRADE URETEROSCOPY;  Surgeon: Matilda Senior, MD;  Location: [facility] ORS;  Service: Urology;  Laterality: Right;   URETER SURGERY     x2   WISDOM TOOTH EXTRACTION      Current  Medications: Current Meds  Medication Sig   amLODipine  (NORVASC ) 5 MG tablet Take 1 tablet (5 mg total) by mouth daily.   buPROPion (WELLBUTRIN SR) 200 MG 12 hr tablet Take 200 mg by mouth 2 (two) times daily.   DULoxetine (CYMBALTA) 30 MG capsule Take 30 mg by mouth 2 (two) times daily.   EPINEPHrine  0.3 mg/0.3 mL IJ SOAJ injection Inject 0.3 mg into the muscle as needed for anaphylaxis.   estradiol  (VIVELLE -DOT) 0.05 MG/24HR patch Place 1 patch (0.05 mg total) onto the skin 2 (two) times a week.   famotidine  (PEPCID ) 20 MG tablet Take 1 tablet (20 mg total) by mouth 2 (two) times daily as needed for heartburn or indigestion.   Multiple Vitamin (MULTIVITAMIN) tablet Take 1 tablet by mouth daily.   NALTREXONE HCL, PAIN, PO    prazosin  (MINIPRESS ) 1 MG capsule Take 1 capsule (1 mg total) by mouth at bedtime.   pregabalin  (LYRICA ) 50 MG capsule Take 1 capsule (50 mg total) by mouth at bedtime.   propranolol  (INDERAL ) 10 MG tablet Take 1 tablet (10 mg total) by mouth daily.   tiZANidine  (ZANAFLEX ) 2 MG tablet Take 1 tablet (2 mg total) by mouth 2 (two) times daily as needed for muscle spasms.   topiramate  (TOPAMAX ) 25 MG tablet Take 1 tablet (25 mg total) by mouth 3 (three) times daily as needed.   XANAX  0.25 MG tablet Take 0.25 mg by mouth as needed.     Allergies:  Tape   Social History   Socioeconomic History   Marital status: Significant Other    Spouse name: Not on file   Number of children: 3   Years of education: Not on file   Highest education level: Associate degree: occupational, scientist, product/process development, or vocational program  Occupational History   Occupation: Vet tech  Tobacco Use   Smoking status: Former    Current packs/day: 0.00    Types: Cigarettes    Quit date: 10/12/2020    Years since quitting: 3.6   Smokeless tobacco: Never   Tobacco comments:    off and on   Vaping Use   Vaping status: Never Used  Substance and Sexual Activity   Alcohol use: Not Currently   Drug use:  Never   Sexual activity: Not on file  Other Topics Concern   Not on file  Social History Narrative   Not on file   Social Drivers of Health   Financial Resource Strain: Low Risk  (06/05/2024)   Overall Financial Resource Strain (CARDIA)    Difficulty of Paying Living Expenses: Not very hard  Food Insecurity: No Food Insecurity (06/05/2024)   Hunger Vital Sign    Worried About Running Out of Food in the Last Year: Never true    Ran Out of Food in the Last Year: Never true  Transportation Needs: No Transportation Needs (06/05/2024)   PRAPARE - Administrator, Civil Service (Medical): No    Lack of Transportation (Non-Medical): No  Physical Activity: Inactive (06/05/2024)   Exercise Vital Sign    Days of Exercise per Week: 0 days    Minutes of Exercise per Session: Not on file  Stress: Stress Concern Present (06/05/2024)   [employer] of Occupational Health - Occupational Stress Questionnaire    Feeling of Stress: Very much  Social Connections: Socially Isolated (06/05/2024)   Social Connection and Isolation Panel    Frequency of Communication with Friends and Family: Never    Frequency of Social Gatherings with Friends and Family: Never    Attends Religious Services: Never    Database Administrator or Organizations: No    Attends Engineer, Structural: Not on file    Marital Status: Divorced     Family History: The patient's family history includes Hypertension in her father.  ROS:   Review of Systems  Constitution: Negative for decreased appetite, fever and weight gain.  HENT: Negative for congestion, ear discharge, hoarse voice and sore throat.   Eyes: Negative for discharge, redness, vision loss in right eye and visual halos.  Cardiovascular: Negative for chest pain, dyspnea on exertion, leg swelling, orthopnea and palpitations.  Respiratory: Negative for cough, hemoptysis, shortness of breath and snoring.   Endocrine: Negative for heat  intolerance and polyphagia.  Hematologic/Lymphatic: Negative for bleeding problem. Does not bruise/bleed easily.  Skin: Negative for flushing, nail changes, rash and suspicious lesions.  Musculoskeletal: Negative for arthritis, joint pain, muscle cramps, myalgias, neck pain and stiffness.  Gastrointestinal: Negative for abdominal pain, bowel incontinence, diarrhea and excessive appetite.  Genitourinary: Negative for decreased libido, genital sores and incomplete emptying.  Neurological: Negative for brief paralysis, focal weakness, headaches and loss of balance.  Psychiatric/Behavioral: Negative for altered mental status, depression and suicidal ideas.  Allergic/Immunologic: Negative for HIV exposure and persistent infections.    EKGs/Labs/Other Studies Reviewed:    The following studies were reviewed today:   EKG:  The ekg ordered today demonstrates   Recent Labs: 04/15/2024: TSH 1.580 04/18/2024:  ALT 6; BUN 14; Creatinine, Ser 1.14; Potassium 3.9; Sodium 140 04/21/2024: Hemoglobin 13.4; Platelets 179  Recent Lipid Panel    Component Value Date/Time   CHOL 225 (H) 03/22/2024 1225   TRIG 79 03/22/2024 1225   HDL 45 03/22/2024 1225   CHOLHDL 5.0 (H) 03/22/2024 1225   LDLCALC 166 (H) 03/22/2024 1225    Physical Exam:    VS:  BP (!) 128/100 (BP Location: Right Arm, Patient Position: Sitting, Cuff Size: Normal)   Pulse 87   Ht 5' 5 (1.651 m)   Wt 168 lb 12.8 oz (76.6 kg)   LMP 07/19/2022 Comment: Had baby in September  SpO2 98%   BMI 28.09 kg/m     Wt Readings from Last 3 Encounters:  06/16/24 168 lb 12.8 oz (76.6 kg)  06/14/24 164 lb 9.6 oz (74.7 kg)  06/08/24 161 lb 12.8 oz (73.4 kg)     GEN: Well nourished, well developed in no acute distress HEENT: Normal NECK: No JVD; No carotid bruits LYMPHATICS: No lymphadenopathy CARDIAC: S1S2 noted,RRR, no murmurs, rubs, gallops RESPIRATORY:  Clear to auscultation without rales, wheezing or rhonchi  ABDOMEN: Soft, non-tender,  non-distended, +bowel sounds, no guarding. EXTREMITIES: No edema, No cyanosis, no clubbing MUSCULOSKELETAL:  No deformity  SKIN: Warm and dry NEUROLOGIC:  Alert and oriented x 3, non-focal PSYCHIATRIC:  Normal affect, good insight  ASSESSMENT:    1. Hyperlipidemia, unspecified hyperlipidemia type   2. Hypertensive heart disease without heart failure   3. Vitamin D deficiency   4. Palpitations    PLAN:    I will start amlodipine  5 mg daily.  Will get an echocardiogram to assess for any structural abnormality including LVH in the setting of her hypertensive heart disease. With the palpitations suspect postural orthostatic tachycardia syndrome/will get an monitor to rule out any other arrhythmias. She has hyperlipidemia will get lipid profile including LP(a). For fatigue we will get vitamin D levels.  Shared decision she will hold the propranolol  during the time of wearing her monitor.  Then she will restart this.  Plan to adjust medications as appropriate likely start propranolol  and add a long-acting dose of 60 mg daily.  The patient is in agreement with the above plan. The patient left the office in stable condition.  The patient will follow up in   Medication Adjustments/Labs and Tests Ordered: Current medicines are reviewed at length with the patient today.  Concerns regarding medicines are outlined above.  Orders Placed This Encounter  Procedures   Lipid panel   Lipoprotein A (LPA)   VITAMIN D 25 Hydroxy (Vit-D Deficiency, Fractures)   LONG TERM MONITOR (3-14 DAYS)   ECHOCARDIOGRAM COMPLETE   Meds ordered this encounter  Medications   amLODipine  (NORVASC ) 5 MG tablet    Sig: Take 1 tablet (5 mg total) by mouth daily.    Dispense:  90 tablet    Refill:  3    Patient Instructions  Medication Instructions:  Your physician has recommended you make the following change in your medication:  START: Amlodipine  5 mg once daily  *If you need a refill on your cardiac  medications before your next appointment, please call your pharmacy*  Lab Work: Lipids, Lp(a), Vit D If you have labs (blood work) drawn today and your tests are completely normal, you will receive your results only by: MyChart Message (if you have MyChart) OR A paper copy in the mail If you have any lab test that is abnormal or we need to change  your treatment, we will call you to review the results.  Testing/Procedures: Your physician has requested that you have an echocardiogram. Echocardiography is a painless test that uses sound waves to create images of your heart. It provides your doctor with information about the size and shape of your heart and how well your heart's chambers and valves are working. This procedure takes approximately one hour. There are no restrictions for this procedure. Please do NOT wear cologne, perfume, aftershave, or lotions (deodorant is allowed). Please arrive 15 minutes prior to your appointment time.  Please note: We ask at that you not bring children with you during ultrasound (echo/ vascular) testing. Due to room size and safety concerns, children are not allowed in the ultrasound rooms during exams. Our front office staff cannot provide observation of children in our lobby area while testing is being conducted. An adult accompanying a patient to their appointment will only be allowed in the ultrasound room at the discretion of the ultrasound technician under special circumstances. We apologize for any inconvenience.  ZIO XT- Long Term Monitor Instructions  Your physician has requested you wear a ZIO patch monitor for 14 days.  This is a single patch monitor. [employer] supplies one patch monitor per enrollment. Additional stickers are not available. Please do not apply patch if you will be having a Nuclear Stress Test,  Echocardiogram, Cardiac CT, MRI, or Chest Xray during the period you would be wearing the  monitor. The patch cannot be worn during these  tests. You cannot remove and re-apply the  ZIO XT patch monitor.  Your ZIO patch monitor will be mailed 3 day USPS to your address on file. It may take 3-5 days  to receive your monitor after you have been enrolled.  Once you have received your monitor, please review the enclosed instructions. Your monitor  has already been registered assigning a specific monitor serial # to you.  Billing and Patient Assistance Program Information  We have supplied [employer] with any of your insurance information on file for billing purposes. [employer] offers a sliding scale Patient Assistance Program for patients that do not have  insurance, or whose insurance does not completely cover the cost of the ZIO monitor.  You must apply for the Patient Assistance Program to qualify for this discounted rate.  To apply, please call [employer] at [phone], select option 4, select option 2, ask to apply for  Patient Assistance Program. Meredeth will ask your household income, and how many people  are in your household. They will quote your out-of-pocket cost based on that information.  [employer] will also be able to set up a 80-month, interest-free payment plan if needed.  Applying the monitor   Shave hair from upper left chest.  Hold abrader disc by orange tab. Rub abrader in 40 strokes over the upper left chest as  indicated in your monitor instructions.  Clean area with 4 enclosed alcohol pads. Let dry.  Apply patch as indicated in monitor instructions. Patch will be placed under collarbone on left  side of chest with arrow pointing upward.  Rub patch adhesive wings for 2 minutes. Remove white label marked 1. Remove the white  label marked 2. Rub patch adhesive wings for 2 additional minutes.  While looking in a mirror, press and release button in center of patch. A small green light will  flash 3-4 times. This will be your only indicator that the monitor has been turned on.  Do not shower for the first 24  hours. You may shower after the first 24 hours.  Press the button if you feel a symptom. You will hear a small click. Record Date, Time and  Symptom in the Patient Logbook.  When you are ready to remove the patch, follow instructions on the last 2 pages of Patient  Logbook. Stick patch monitor onto the last page of Patient Logbook.  Place Patient Logbook in the blue and white box. Use locking tab on box and tape box closed  securely. The blue and white box has prepaid postage on it. Please place it in the mailbox as  soon as possible. Your physician should have your test results approximately 7 days after the  monitor has been mailed back to [facility].  Call [facility] Customer Care at [phone] if you have questions regarding  your ZIO XT patch monitor. Call them immediately if you see an orange light blinking on your  monitor.  If your monitor falls off in less than 4 days, contact our Monitor department at [phone].  If your monitor becomes loose or falls off after 4 days call [employer] at [phone] for  suggestions on securing your monitor   Follow-Up: At [facility], you and your health needs are our priority.  As part of our continuing mission to provide you with exceptional heart care, our providers are all part of one team.  This team includes your primary Cardiologist (physician) and Advanced Practice Providers or APPs (Physician Assistants and Nurse Practitioners) who all work together to provide you with the care you need, when you need it.  Your next appointment:   12 week(s)  Provider:   Tiaunna Buford, DO     Adopting a Healthy Lifestyle.  Know what a healthy weight is for you (roughly BMI <25) and aim to maintain this   Aim for 7+ servings of fruits and vegetables daily   65-80+ fluid ounces of water  or unsweet tea for healthy kidneys   Limit to max 1 drink of alcohol per day; avoid smoking/tobacco   Limit animal fats in diet for  cholesterol and heart health - choose grass fed whenever available   Avoid highly processed foods, and foods high in saturated/trans fats   Aim for low stress - take time to unwind and care for your mental health   Aim for 150 min of moderate intensity exercise weekly for heart health, and weights twice weekly for bone health   Aim for 7-9 hours of sleep daily   When it comes to diets, agreement about the perfect plan isnt easy to find, even among the experts. Experts at the [facility] of [employer] developed an idea known as the Healthy Eating Plate. Just imagine a plate divided into logical, healthy portions.   The emphasis is on diet quality:   Load up on vegetables and fruits - one-half of your plate: Aim for color and variety, and remember that potatoes dont count.   Go for whole grains - one-quarter of your plate: Whole wheat, barley, wheat berries, quinoa, oats, brown rice, and foods made with them. If you want pasta, go with whole wheat pasta.   Protein power - one-quarter of your plate: Fish, chicken, beans, and nuts are all healthy, versatile protein sources. Limit red meat.   The diet, however, does go beyond the plate, offering a few other suggestions.   Use healthy plant oils, such as olive, canola, soy, corn, sunflower and peanut. Check the labels, and avoid partially hydrogenated oil, which  have unhealthy trans fats.   If youre thirsty, drink water . Coffee and tea are good in moderation, but skip sugary drinks and limit milk and dairy products to one or two daily servings.   The type of carbohydrate in the diet is more important than the amount. Some sources of carbohydrates, such as vegetables, fruits, whole grains, and beans-are healthier than others.   Finally, stay active  Signed, Dub Huntsman, DO  06/16/2024 11:51 AM    [facility]

## 2024-06-16 NOTE — Progress Notes (Unsigned)
 Enrolled patient for a 14 day Zio XT  monitor to be mailed to patients home

## 2024-06-16 NOTE — Patient Instructions (Signed)
 Medication Instructions:  Your physician has recommended you make the following change in your medication:  START: Amlodipine  5 mg once daily  *If you need a refill on your cardiac medications before your next appointment, please call your pharmacy*  Lab Work: Lipids, Lp(a), Vit D If you have labs (blood work) drawn today and your tests are completely normal, you will receive your results only by: MyChart Message (if you have MyChart) OR A paper copy in the mail If you have any lab test that is abnormal or we need to change your treatment, we will call you to review the results.  Testing/Procedures: Your physician has requested that you have an echocardiogram. Echocardiography is a painless test that uses sound waves to create images of your heart. It provides your doctor with information about the size and shape of your heart and how well your heart's chambers and valves are working. This procedure takes approximately one hour. There are no restrictions for this procedure. Please do NOT wear cologne, perfume, aftershave, or lotions (deodorant is allowed). Please arrive 15 minutes prior to your appointment time.  Please note: We ask at that you not bring children with you during ultrasound (echo/ vascular) testing. Due to room size and safety concerns, children are not allowed in the ultrasound rooms during exams. Our front office staff cannot provide observation of children in our lobby area while testing is being conducted. An adult accompanying a patient to their appointment will only be allowed in the ultrasound room at the discretion of the ultrasound technician under special circumstances. We apologize for any inconvenience.  ZIO XT- Long Term Monitor Instructions  Your physician has requested you wear a ZIO patch monitor for 14 days.  This is a single patch monitor. [employer] supplies one patch monitor per enrollment. Additional stickers are not available. Please do not apply patch if you  will be having a Nuclear Stress Test,  Echocardiogram, Cardiac CT, MRI, or Chest Xray during the period you would be wearing the  monitor. The patch cannot be worn during these tests. You cannot remove and re-apply the  ZIO XT patch monitor.  Your ZIO patch monitor will be mailed 3 day USPS to your address on file. It may take 3-5 days  to receive your monitor after you have been enrolled.  Once you have received your monitor, please review the enclosed instructions. Your monitor  has already been registered assigning a specific monitor serial # to you.  Billing and Patient Assistance Program Information  We have supplied [employer] with any of your insurance information on file for billing purposes. [employer] offers a sliding scale Patient Assistance Program for patients that do not have  insurance, or whose insurance does not completely cover the cost of the ZIO monitor.  You must apply for the Patient Assistance Program to qualify for this discounted rate.  To apply, please call [employer] at [phone], select option 4, select option 2, ask to apply for  Patient Assistance Program. Meredeth will ask your household income, and how many people  are in your household. They will quote your out-of-pocket cost based on that information.  [employer] will also be able to set up a 72-month, interest-free payment plan if needed.  Applying the monitor   Shave hair from upper left chest.  Hold abrader disc by orange tab. Rub abrader in 40 strokes over the upper left chest as  indicated in your monitor instructions.  Clean area with 4 enclosed alcohol pads. Let dry.  Apply patch as indicated in monitor instructions. Patch will be placed under collarbone on left  side of chest with arrow pointing upward.  Rub patch adhesive wings for 2 minutes. Remove white label marked 1. Remove the white  label marked 2. Rub patch adhesive wings for 2 additional minutes.  While looking in a mirror, press and release  button in center of patch. A small green light will  flash 3-4 times. This will be your only indicator that the monitor has been turned on.  Do not shower for the first 24 hours. You may shower after the first 24 hours.  Press the button if you feel a symptom. You will hear a small click. Record Date, Time and  Symptom in the Patient Logbook.  When you are ready to remove the patch, follow instructions on the last 2 pages of Patient  Logbook. Stick patch monitor onto the last page of Patient Logbook.  Place Patient Logbook in the blue and white box. Use locking tab on box and tape box closed  securely. The blue and white box has prepaid postage on it. Please place it in the mailbox as  soon as possible. Your physician should have your test results approximately 7 days after the  monitor has been mailed back to [facility].  Call [facility] Customer Care at [phone] if you have questions regarding  your ZIO XT patch monitor. Call them immediately if you see an orange light blinking on your  monitor.  If your monitor falls off in less than 4 days, contact our Monitor department at [phone].  If your monitor becomes loose or falls off after 4 days call [employer] at [phone] for  suggestions on securing your monitor   Follow-Up: At [facility], you and your health needs are our priority.  As part of our continuing mission to provide you with exceptional heart care, our providers are all part of one team.  This team includes your primary Cardiologist (physician) and Advanced Practice Providers or APPs (Physician Assistants and Nurse Practitioners) who all work together to provide you with the care you need, when you need it.  Your next appointment:   12 week(s)  Provider:   Kardie Tobb, DO

## 2024-06-17 ENCOUNTER — Ambulatory Visit

## 2024-06-17 LAB — LIPID PANEL
Chol/HDL Ratio: 5 ratio — ABNORMAL HIGH (ref 0.0–4.4)
Cholesterol, Total: 263 mg/dL — ABNORMAL HIGH (ref 100–199)
HDL: 53 mg/dL (ref >39)
LDL Chol Calc (NIH): 194 mg/dL — ABNORMAL HIGH (ref 0–99)
Triglycerides: 94 mg/dL (ref 0–149)
VLDL Cholesterol Cal: 16 mg/dL (ref 5–40)

## 2024-06-17 LAB — LIPOPROTEIN A (LPA): Lipoprotein (a): 143.8 nmol/L — ABNORMAL HIGH (ref <75.0)

## 2024-06-17 LAB — VITAMIN D 25 HYDROXY (VIT D DEFICIENCY, FRACTURES): Vit D, 25-Hydroxy: 24.8 ng/mL — ABNORMAL LOW (ref 30.0–100.0)

## 2024-06-20 ENCOUNTER — Ambulatory Visit (INDEPENDENT_AMBULATORY_CARE_PROVIDER_SITE_OTHER)

## 2024-06-20 VITALS — BP 131/91 | HR 83 | Temp 98.1°F | Resp 18 | Ht 65.0 in | Wt 168.4 lb

## 2024-06-20 DIAGNOSIS — G90A Postural orthostatic tachycardia syndrome (POTS): Secondary | ICD-10-CM | POA: Diagnosis not present

## 2024-06-20 MED ORDER — SODIUM CHLORIDE 0.9 % IV BOLUS (SEPSIS)
250.0000 mL | Freq: Once | INTRAVENOUS | Status: AC
  Administered 2024-06-20: 250 mL via INTRAVENOUS
  Filled 2024-06-20 (×2): qty 250

## 2024-06-20 NOTE — Progress Notes (Signed)
 Diagnosis: POTS  Provider:  Praveen Mannam MD  Procedure: IV Infusion  IV Type: Peripheral, IV Location: R Antecubital  Normal Saline, Dose:  Infusion Start Time: 1031  Infusion Stop Time: 1133  Post Infusion IV Care: Peripheral IV Discontinued  Discharge: Condition: Good, Destination: Home . AVS Declined  Performed by:  Robby Pirani, RN

## 2024-06-21 ENCOUNTER — Ambulatory Visit (HOSPITAL_BASED_OUTPATIENT_CLINIC_OR_DEPARTMENT_OTHER): Admitting: Physical Therapy

## 2024-06-21 ENCOUNTER — Encounter (HOSPITAL_BASED_OUTPATIENT_CLINIC_OR_DEPARTMENT_OTHER): Payer: Self-pay | Admitting: Physical Therapy

## 2024-06-21 DIAGNOSIS — M5459 Other low back pain: Secondary | ICD-10-CM | POA: Diagnosis not present

## 2024-06-21 DIAGNOSIS — R2681 Unsteadiness on feet: Secondary | ICD-10-CM

## 2024-06-21 DIAGNOSIS — M6281 Muscle weakness (generalized): Secondary | ICD-10-CM

## 2024-06-21 DIAGNOSIS — F322 Major depressive disorder, single episode, severe without psychotic features: Secondary | ICD-10-CM | POA: Diagnosis not present

## 2024-06-21 DIAGNOSIS — F4310 Post-traumatic stress disorder, unspecified: Secondary | ICD-10-CM | POA: Diagnosis not present

## 2024-06-21 NOTE — Therapy (Signed)
 OUTPATIENT PHYSICAL THERAPY THORACOLUMBAR TREATMENT Progress Note/Re-cert Reporting Period 04/08/24 to 06/20/24  See note below for Objective Data and Assessment of Progress/Goals.      Patient Name: Laurie Casey MRN: [id number] DOB:[birth year], 38 y.o., female Today's Date: 06/21/2024  END OF SESSION:  PT End of Session - 06/21/24 1626     Visit Number 8    Number of Visits 12    Date for Recertification  08/19/24    Authorization Type [employer] med[employer]    Authorization Time Period visit #1/5 visits approved  From 11.25.2025 - 01.23.2026    Authorization - Visit Number 3    Authorization - Number of Visits 5    PT Start Time 1616    PT Stop Time 1700    PT Time Calculation (min) 44 min    Activity Tolerance Patient tolerated treatment well    Behavior During Therapy [facility] for tasks assessed/performed              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   Pre-eclampsia    PTSD (post-traumatic stress disorder) 06/30/2022   Past Surgical History:  Procedure Laterality Date   CESAREAN SECTION  2013, 2015, 2019   x2   CESAREAN SECTION  03/24/2023   with partial historectomy with uterus, cervix and tube removal   CYSTOSCOPY  2008 or 2009   CYSTOSCOPY W/ URETERAL STENT PLACEMENT Right 11/29/2020   Procedure: CYSTOSCOPY WITH RETROGRADE PYELOGRAM/URETERAL STENT PLACEMENT;  Surgeon: Matilda Senior, MD;  Location: [facility] OR;  Service: Urology;  Laterality: Right;   CYSTOSCOPY/URETEROSCOPY/HOLMIUM LASER/STENT PLACEMENT Right 12/13/2020   Procedure: CYSTOSCOPY RIGHT URETEROSCOPY/HOLMIUM LASER/STENT EXTRACTION AND RIGHT JJ STENT PLACEMENT, RIGHT RETROGRADE URETEROSCOPY;  Surgeon: Matilda Senior, MD;  Location: [facility]S;  Service: Urology;  Laterality: Right;   URETER SURGERY     x2   WISDOM TOOTH EXTRACTION     Patient Active Problem List   Diagnosis Date Noted   POTS (postural orthostatic tachycardia syndrome) 05/17/2024   Placenta accreta 12/03/2022   History of cesarean section 10/01/2022   Essential hypertension 08/11/2022   Obesity (BMI 30.0-34.9) 08/11/2022   High risk multigravida in third trimester 09/10/2017   Hx of preeclampsia, prior pregnancy, currently pregnant 03/16/2014    PCP: Barnie Louder MD  REFERRING PROVIDER:   Lorilee Sven SQUIBB, MD    REFERRING DIAG: M79.7 (ICD-10-CM) - Fibromyalgia   Rationale for Evaluation and Treatment: Rehabilitation  THERAPY DIAG:  Other low back pain  Muscle weakness (generalized)  Unsteadiness on feet  ONSET DATE: chronic  SUBJECTIVE:  SUBJECTIVE STATEMENT: Saw cardiologist last week and I am on a heart monitor for 2 weeks, can not submerge it.  Felt ok after last treatment Back on Lyrica  pain is 6/10   Initial evaluation:  Fall in June hurt left ankle.  Having a hard time healing.  Ortho put me on a heavy boot for 4 months and it made my back hurt and made ankle worse.  I am in constant pain with my fibro.  Worst in my back.  Get trigger point injection in my back, a lot of joint pain. It feels hot.  Hard to explain. Have a 1 yr old/4 children. We are selling and buying a house. (External stress). Pain interrupts sleep >3 x night. Had abdominal surgery in Sept 2024 and have no core strength  PERTINENT HISTORY:  fibromyalgia  PAIN:  Are you having pain? Yes: NPRS scale: current 3/10 generalized; 6/10 shoulders Pain location: see above Pain description: tense, sharp and dull Aggravating factors: stress Relieving  factors: meds; heat; TENs unit, pressure point mat  PRECAUTIONS: None  RED FLAGS: None   WEIGHT BEARING RESTRICTIONS: No  FALLS:  Has patient fallen in last 6 months? Yes 2: fell down stairs; fell in shower  LIVING ENVIRONMENT: Lives with: lives with their family Lives in: House/apartment Stairs: No Has following equipment at home: None  OCCUPATION: stay at home Mom  PLOF: Independent  PATIENT GOALS: increase toleration to movement; get back to exercise  NEXT MD VISIT: intermittent  OBJECTIVE:  Note: Objective measures were completed at Evaluation unless otherwise noted.   PATIENT SURVEYS:  ODI:30/50=60% 12/9:  27/50=54%  COGNITION: Overall cognitive status: Within functional limits for tasks assessed       POSTURE: increased thoracic kyphosis/ hypermobile   LUMBAR ROM:   hypermobile  LOWER EXTREMITY ROM:     Active  Right eval Left eval  Hip flexion    Hip extension    Hip abduction    Hip adduction    Hip internal rotation    Hip external rotation    Knee flexion    Knee extension hyper hyper  Ankle dorsiflexion    Ankle plantarflexion    Ankle inversion    Ankle eversion     (Blank rows = not tested)  LOWER EXTREMITY MMT:     Tested in sitting HD lbs Right eval Left eval R / L 06/21/24  Hip flexion 29.1 21.9 40.0 / 40.7  Hip extension     Hip abduction 18.5 17.1 15.3 / 13.4  Hip adduction     Hip internal rotation     Hip external rotation     Knee flexion     Knee extension 12.8 22.0   Ankle dorsiflexion     Ankle plantarflexion     Ankle inversion     Ankle eversion      (Blank rows = not tested)   FUNCTIONAL TESTS:  Timed up and go (TUG): 14.17   4 stage balance:passed      12/8:  TUG 11.35 GAIT: Distance walked: 400 ft Assistive device utilized: None Level of assistance: Complete Independence Comments: antalgic upon initiation of gait  TREATMENT  [facility] Adult PT Treatment:                                              Date: 87/0/74  Re-cert testing  Pt seen  for aquatic therapy today.  Treatment took place in water  3.5-4.75 ft in depth at the [facility] pool. Temp of water  was 91.  Pt entered/exited the pool via stairs independently with bil rail.  - decompression using noodle wrapped posteriorly-> between legs: cycling; hip add/abd; hip flexion/extension - with light resistance bells: walking forward with reciprocal arm swing; side stepping with arm add/abdct:squatted rest; marching forward with row motion  - UE on wall:  toe/heel raises x10; hip add/abd x10 ; hip extension to toe touch x 5 each; alternating hamstring curls x 5 each - cycling on noodle   Pt requires the buoyancy and hydrostatic pressure of water  for support, and to offload joints by unweighting joint load by at least 50 % in navel deep water  and by at least 75-80% in chest to neck deep water .  Viscosity of the water  is needed for resistance of strengthening. Water  current perturbations provides challenge to standing balance requiring increased core activation.   PATIENT EDUCATION:  Education details: intro to aquatic therapy  Person educated: Patient Education method: Explanation Education comprehension: verbalized understanding  HOME EXERCISE PROGRAM: Aquatic tba  ASSESSMENT:  CLINICAL IMPRESSION: PN: functional and objective testing today completed with improvement in hip flex strength and TUG score as noted in charts above.  This indicates progression with transitional movements balance. Her ODI score has improved by 6% indicating an improvement in pt function. She reports she feels energized after sessions which last for about 4 days.  She  arrives with heart monitor donned.  She is unable to get submerged much > then waist deep.  Decided to cancel next appt as the monitor with still be in use and pt requires deep water  for optimal benefit from aquatic session.       OBJECTIVE IMPAIRMENTS: decreased activity  tolerance, decreased balance, decreased strength, and pain.   ACTIVITY LIMITATIONS: carrying, lifting, bending, sitting, standing, squatting, stairs, transfers, locomotion level, and caring for others  PARTICIPATION LIMITATIONS: meal prep, cleaning, shopping, community activity, occupation, and yard work  PERSONAL FACTORS: Age and Past/current experiences are also affecting patient's functional outcome.   REHAB POTENTIAL: Good  CLINICAL DECISION MAKING: Stable/uncomplicated  EVALUATION COMPLEXITY: Low   GOALS: Goals reviewed with patient? No  SHORT TERM GOALS: Target date: 10/30  Pt will tolerate full aquatic sessions consistently without increase in pain and with improving function to demonstrate good toleration and effectiveness of intervention.  Baseline: Goal status: In progress - 05/17/24;  Met 05/31/24  2.  Pt will report a reduction in pain while submerged by at least 50% to demonstrate using the properties of water  for pain management Baseline:  Goal status: in progress - 05/17/24; Met 05/31/24    LONG TERM GOALS: Target date: 08/19/24  Pt to improve on ODI by 13% to demonstrate statistically significant Improvement in function. (MCID 13-15%) Baseline: 30/50=60%; 54% Goal status: In progress 06/21/24  2.  Pt will report decrease in pain by at least 50% for improved toleration to activity/quality of life and to demonstrate improved management of pain. Baseline: see chart Goal status: In progress 05/31/24; 06/21/24  3.  Pt will improve strength in hips and knees within 5 lbs of contralateral side to demonstrate improved overall physical function Baseline: see chart Goal status: In progress 06/21/24  4.  Pt will report improved toleration to activity by 50% Baseline: see chart Goal status: In progress 06/21/24  5.  Pt will consider gaining pool access for use of the properties of water  for chronic  conditions maintaining mobility and minimizing pain. Baseline: none Goal  status: In progress 05/31/24; 06/21/24    PLAN:  PT FREQUENCY: 1-2x/week  PT DURATION: 8 weeks  PLANNED INTERVENTIONS: 97164- PT Re-evaluation, 97110-Therapeutic exercises, 97530- Therapeutic activity, 97112- Neuromuscular re-education, 97535- Self Care, 02859- Manual therapy, [phone]- Gait training, Patient/Family education, Balance training, Stair training, Taping, Joint mobilization, and DME instructions.  PLAN FOR NEXT SESSION: aquatic: general strengthening (avoid excessive stretching); balance retraining; pain management  Ronal Foots) Samona Chihuahua MPT 06/21/24 5:32 PM [facility] [address] [place], [place], [address] Phone: [phone]   Fax:  [phone]   For all possible CPT codes, reference the Planned Interventions line above.     Check all conditions that are expected to impact treatment: {Conditions expected to impact treatment:Musculoskeletal disorders   If treatment provided at initial evaluation, no treatment charged due to lack of authorization.

## 2024-06-23 ENCOUNTER — Ambulatory Visit (INDEPENDENT_AMBULATORY_CARE_PROVIDER_SITE_OTHER)

## 2024-06-23 VITALS — BP 126/83 | HR 79 | Temp 97.9°F | Resp 18 | Ht 65.0 in | Wt 166.8 lb

## 2024-06-23 DIAGNOSIS — G90A Postural orthostatic tachycardia syndrome (POTS): Secondary | ICD-10-CM | POA: Diagnosis not present

## 2024-06-23 MED ADMIN — Sodium Chloride IV Soln 0.9%: 250 mL | INTRAVENOUS | NDC 00264780020

## 2024-06-23 MED FILL — Sodium Chloride IV Soln 0.9%: 250.0000 mL | INTRAVENOUS | Qty: 250 | Status: AC

## 2024-06-23 NOTE — Progress Notes (Signed)
 Diagnosis: POTS  Provider:  Mannam, Praveen MD  Procedure: IV Infusion  IV Type: Peripheral, IV Location: R Antecubital  Normal Saline, Dose: 250 mg  Infusion Start Time: 1032  Infusion Stop Time: 1444  Post Infusion IV Care: Peripheral IV Discontinued  Discharge: Condition: Good, Destination: Home . AVS Declined  Performed by:  Caitlinn Klinker, RN

## 2024-06-27 ENCOUNTER — Ambulatory Visit (HOSPITAL_BASED_OUTPATIENT_CLINIC_OR_DEPARTMENT_OTHER): Payer: Self-pay | Admitting: Physical Therapy

## 2024-06-28 ENCOUNTER — Ambulatory Visit (HOSPITAL_BASED_OUTPATIENT_CLINIC_OR_DEPARTMENT_OTHER): Admitting: Physical Therapy

## 2024-06-28 ENCOUNTER — Ambulatory Visit

## 2024-06-28 VITALS — BP 132/86 | HR 90 | Temp 97.6°F | Resp 18 | Ht 65.0 in | Wt 168.2 lb

## 2024-06-28 DIAGNOSIS — G90A Postural orthostatic tachycardia syndrome (POTS): Secondary | ICD-10-CM | POA: Diagnosis not present

## 2024-06-28 MED ORDER — SODIUM CHLORIDE 0.9 % IV BOLUS (SEPSIS)
250.0000 mL | Freq: Once | INTRAVENOUS | Status: AC
  Administered 2024-06-28: 14:00:00 250 mL via INTRAVENOUS
  Filled 2024-06-28: qty 250

## 2024-06-28 NOTE — Progress Notes (Signed)
 Diagnosis: POTS  Provider:  Praveen Mannam MD  Procedure: IV Infusion  IV Type: Peripheral, IV Location: R Antecubital  Normal Saline, Dose: 250 ml  Infusion Start Time: 1353  Infusion Stop Time: 1454  Post Infusion IV Care: Peripheral IV Discontinued  Discharge: Condition: Good, Destination: Home . AVS Declined  Performed by:  Laurencia Roma E, RN

## 2024-06-30 DIAGNOSIS — F4310 Post-traumatic stress disorder, unspecified: Secondary | ICD-10-CM | POA: Diagnosis not present

## 2024-06-30 DIAGNOSIS — F322 Major depressive disorder, single episode, severe without psychotic features: Secondary | ICD-10-CM | POA: Diagnosis not present

## 2024-07-01 ENCOUNTER — Ambulatory Visit

## 2024-07-01 VITALS — BP 125/89 | HR 78 | Temp 98.2°F | Resp 16 | Ht 65.0 in | Wt 165.2 lb

## 2024-07-01 DIAGNOSIS — G90A Postural orthostatic tachycardia syndrome (POTS): Secondary | ICD-10-CM | POA: Diagnosis not present

## 2024-07-01 MED ORDER — SODIUM CHLORIDE 0.9 % IV BOLUS (SEPSIS)
250.0000 mL | Freq: Once | INTRAVENOUS | Status: AC
  Administered 2024-07-01: 250 mL via INTRAVENOUS
  Filled 2024-07-01: qty 250

## 2024-07-01 NOTE — Progress Notes (Signed)
 Diagnosis: POTS (postural orthostatic tachycardia syndrome)   Provider:  Praveen Mannam MD  Procedure: IV Infusion  IV Type: Peripheral, IV Location: R Antecubital   Normal Saline, Dose: 250 mg  Infusion Start Time: 1415  Infusion Stop Time: 1516  Post Infusion IV Care: Peripheral IV Discontinued  Discharge: Condition: Good, Destination: Home . AVS Declined  Performed by:  Maximiano JONELLE Pouch, LPN

## 2024-07-05 ENCOUNTER — Ambulatory Visit (HOSPITAL_BASED_OUTPATIENT_CLINIC_OR_DEPARTMENT_OTHER): Admitting: Physical Therapy

## 2024-07-05 ENCOUNTER — Ambulatory Visit

## 2024-07-05 ENCOUNTER — Encounter (HOSPITAL_BASED_OUTPATIENT_CLINIC_OR_DEPARTMENT_OTHER): Payer: Self-pay | Admitting: Physical Therapy

## 2024-07-05 VITALS — BP 132/87 | HR 73 | Temp 97.8°F | Resp 16 | Ht 65.0 in | Wt 168.8 lb

## 2024-07-05 DIAGNOSIS — M5459 Other low back pain: Secondary | ICD-10-CM | POA: Diagnosis not present

## 2024-07-05 DIAGNOSIS — G90A Postural orthostatic tachycardia syndrome (POTS): Secondary | ICD-10-CM | POA: Diagnosis not present

## 2024-07-05 DIAGNOSIS — M6281 Muscle weakness (generalized): Secondary | ICD-10-CM

## 2024-07-05 DIAGNOSIS — R2681 Unsteadiness on feet: Secondary | ICD-10-CM

## 2024-07-05 MED ORDER — SODIUM CHLORIDE 0.9 % IV BOLUS (SEPSIS)
250.0000 mL | Freq: Once | INTRAVENOUS | Status: AC
  Administered 2024-07-05: 250 mL via INTRAVENOUS
  Filled 2024-07-05: qty 250

## 2024-07-05 NOTE — Progress Notes (Signed)
 Diagnosis: POTS (postural orthostatic tachycardia syndrome)   Provider:  Praveen Mannam MD  Procedure: IV Infusion  IV Type: Peripheral, IV Location: R Antecubital   Normal Saline, Dose: 250 mg  Infusion Start Time: 1430  Infusion Stop Time: 1535  Post Infusion IV Care: Peripheral IV Discontinued  Discharge: Condition: Good, Destination: Home . AVS Declined  Performed by:  Maximiano JONELLE Pouch, LPN

## 2024-07-05 NOTE — Therapy (Signed)
 " OUTPATIENT PHYSICAL THERAPY THORACOLUMBAR TREATMENT     Patient Name: Laurie Casey MRN: [id number] DOB:1986-06-25, 38 y.o., female Today's Date: 07/05/2024  END OF SESSION:  PT End of Session - 07/05/24 1202     Visit Number 9    Number of Visits 12    Date for Recertification  08/19/24    Authorization Type [employer] medicaid    Authorization Time Period visit #1/5 visits approved  From 11.25.2025 - 01.23.2026    Authorization - Visit Number 4    Authorization - Number of Visits 5    PT Start Time 1150    PT Stop Time 1230    PT Time Calculation (min) 40 min    Activity Tolerance Patient tolerated treatment well    Behavior During Therapy [facility] for tasks assessed/performed              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   Pre-eclampsia    PTSD (post-traumatic stress disorder) 06/30/2022   Past Surgical History:  Procedure Laterality Date   CESAREAN SECTION  2013, 2015, 2019   x2   CESAREAN SECTION  03/24/2023   with partial historectomy with uterus, cervix and tube removal   CYSTOSCOPY  2008 or 2009   CYSTOSCOPY W/ URETERAL STENT PLACEMENT Right 11/29/2020   Procedure: CYSTOSCOPY WITH RETROGRADE PYELOGRAM/URETERAL STENT PLACEMENT;  Surgeon: Matilda Senior, MD;  Location: [facility] OR;  Service: Urology;  Laterality: Right;   CYSTOSCOPY/URETEROSCOPY/HOLMIUM LASER/STENT PLACEMENT Right 12/13/2020   Procedure: CYSTOSCOPY RIGHT URETEROSCOPY/HOLMIUM LASER/STENT EXTRACTION AND RIGHT JJ STENT PLACEMENT, RIGHT RETROGRADE URETEROSCOPY;  Surgeon: Matilda Senior, MD;  Location: [facility];  Service: Urology;  Laterality: Right;   URETER SURGERY     x2   WISDOM TOOTH EXTRACTION      Patient Active Problem List   Diagnosis Date Noted   POTS (postural orthostatic tachycardia syndrome) 05/17/2024   Placenta accreta 12/03/2022   History of cesarean section 10/01/2022   Essential hypertension 08/11/2022   Obesity (BMI 30.0-34.9) 08/11/2022   High risk multigravida in third trimester 09/10/2017   Hx of preeclampsia, prior pregnancy, currently pregnant 03/16/2014    PCP: Barnie Louder MD  REFERRING PROVIDER:   Lorilee Sven SQUIBB, MD    REFERRING DIAG: M79.7 (ICD-10-CM) - Fibromyalgia   Rationale for Evaluation and Treatment: Rehabilitation  THERAPY DIAG:  Other low back pain  Muscle weakness (generalized)  Unsteadiness on feet  ONSET DATE: chronic  SUBJECTIVE:  SUBJECTIVE STATEMENT: Have  not gotten the results back from heart monitor.  My body has missed the pool bad. Pain at night has been bad. pain is 6/10   Initial evaluation:  Fall in June hurt left ankle.  Having a hard time healing.  Ortho put me on a heavy boot for 4 months and it made my back hurt and made ankle worse.  I am in constant pain with my fibro.  Worst in my back.  Get trigger point injection in my back, a lot of joint pain. It feels hot.  Hard to explain. Have a 1 yr old/4 children. We are selling and buying a house. (External stress). Pain interrupts sleep >3 x night. Had abdominal surgery in Sept 2024 and have no core strength  PERTINENT HISTORY:  fibromyalgia  PAIN:  Are you having pain? Yes: NPRS scale: current 3/10 generalized; 6/10 shoulders Pain location: see above Pain description: tense, sharp and dull Aggravating factors: stress Relieving factors: meds; heat; TENs unit, pressure point mat  PRECAUTIONS: None  RED FLAGS: None   WEIGHT BEARING RESTRICTIONS: No  FALLS:  Has  patient fallen in last 6 months? Yes 2: fell down stairs; fell in shower  LIVING ENVIRONMENT: Lives with: lives with their family Lives in: House/apartment Stairs: No Has following equipment at home: None  OCCUPATION: stay at home Mom  PLOF: Independent  PATIENT GOALS: increase toleration to movement; get back to exercise  NEXT MD VISIT: intermittent  OBJECTIVE:  Note: Objective measures were completed at Evaluation unless otherwise noted.   PATIENT SURVEYS:  ODI:30/50=60% 12/9:  27/50=54%  COGNITION: Overall cognitive status: Within functional limits for tasks assessed       POSTURE: increased thoracic kyphosis/ hypermobile   LUMBAR ROM:   hypermobile  LOWER EXTREMITY ROM:     Active  Right eval Left eval  Hip flexion    Hip extension    Hip abduction    Hip adduction    Hip internal rotation    Hip external rotation    Knee flexion    Knee extension hyper hyper  Ankle dorsiflexion    Ankle plantarflexion    Ankle inversion    Ankle eversion     (Blank rows = not tested)  LOWER EXTREMITY MMT:     Tested in sitting HD lbs Right eval Left eval R / L 06/21/24  Hip flexion 29.1 21.9 40.0 / 40.7  Hip extension     Hip abduction 18.5 17.1 15.3 / 13.4  Hip adduction     Hip internal rotation     Hip external rotation     Knee flexion     Knee extension 12.8 22.0   Ankle dorsiflexion     Ankle plantarflexion     Ankle inversion     Ankle eversion      (Blank rows = not tested)   FUNCTIONAL TESTS:  Timed up and go (TUG): 14.17   4 stage balance:passed      12/8:  TUG 11.35 GAIT: Distance walked: 400 ft Assistive device utilized: None Level of assistance: Complete Independence Comments: antalgic upon initiation of gait  TREATMENT  [facility] Adult PT Treatment:                                             Date: 07/05/24  Pt seen for aquatic therapy today.  Treatment took  place in water  3.5-4.75 ft in depth at the [facility] pool.  Temp of water  was 91.  Pt entered/exited the pool via stairs independently with bil rail.  -walking forward, back and side stepping unsupported - with light resistance bells: walking forward with reciprocal arm swing; side stepping with arm add/abdct: squatted rest; marching forward with row motion  - prone suspension with noodle wrapped anteriorly across chest: (pain relieving position for pt) -hip add/abd; knees to chest 2 x5 - UE on wall:  toe/heel raises x10; hip add/abd x10 ; hip flex/ext x 5 each; alternating hamstring curls x 5 each - decompression using noodle wrapped posteriorly-> between legs: cycling  Pt requires the buoyancy and hydrostatic pressure of water  for support, and to offload joints by unweighting joint load by at least 50 % in navel deep water  and by at least 75-80% in chest to neck deep water .  Viscosity of the water  is needed for resistance of strengthening. Water  current perturbations provides challenge to standing balance requiring increased core activation.   PATIENT EDUCATION:  Education details: intro to aquatic therapy  Person educated: Patient Education method: Explanation Education comprehension: verbalized understanding  HOME EXERCISE PROGRAM: Aquatic tba  ASSESSMENT:  CLINICAL IMPRESSION: Prone suspension today found to relieve majority of pain in hips and knees 0-1/10. She tolerates full session albeit with slow pace of engagement.  She is planning on gaining membership here at [facility] for pool access.   PN: functional and objective testing today completed with improvement in hip flex strength and TUG score as noted in charts above.  This indicates progression with transitional movements balance. Her ODI score has improved by 6% indicating an improvement in pt function. She reports she feels energized after sessions which last for about 4 days.  She  arrives with heart monitor donned.  She is unable to get submerged much > then waist deep.  Decided to  cancel next appt as the monitor with still be in use and pt requires deep water  for optimal benefit from aquatic session.       OBJECTIVE IMPAIRMENTS: decreased activity tolerance, decreased balance, decreased strength, and pain.   ACTIVITY LIMITATIONS: carrying, lifting, bending, sitting, standing, squatting, stairs, transfers, locomotion level, and caring for others  PARTICIPATION LIMITATIONS: meal prep, cleaning, shopping, community activity, occupation, and yard work  PERSONAL FACTORS: Age and Past/current experiences are also affecting patient's functional outcome.   REHAB POTENTIAL: Good  CLINICAL DECISION MAKING: Stable/uncomplicated  EVALUATION COMPLEXITY: Low   GOALS: Goals reviewed with patient? No  SHORT TERM GOALS: Target date: 10/30  Pt will tolerate full aquatic sessions consistently without increase in pain and with improving function to demonstrate good toleration and effectiveness of intervention.  Baseline: Goal status: In progress - 05/17/24;  Met 05/31/24  2.  Pt will report a reduction in pain while submerged by at least 50% to demonstrate using the properties of water  for pain management Baseline:  Goal status: in progress - 05/17/24; Met 05/31/24    LONG TERM GOALS: Target date: 08/19/24  Pt to improve on ODI by 13% to demonstrate statistically significant Improvement in function. (MCID 13-15%) Baseline: 30/50=60%; 54% Goal status: In progress 06/21/24  2.  Pt will report decrease in pain by at least 50% for improved toleration to activity/quality of life and to demonstrate improved management of pain. Baseline: see chart Goal status: In progress 05/31/24; 06/21/24  3.  Pt will improve strength in hips and knees within 5 lbs of contralateral side to demonstrate  improved overall physical function Baseline: see chart Goal status: In progress 06/21/24  4.  Pt will report improved toleration to activity by 50% Baseline: see chart Goal status: In  progress 06/21/24  5.  Pt will consider gaining pool access for use of the properties of water  for chronic conditions maintaining mobility and minimizing pain. Baseline: none Goal status: In progress 05/31/24; 06/21/24    PLAN:  PT FREQUENCY: 1-2x/week  PT DURATION: 8 weeks  PLANNED INTERVENTIONS: 97164- PT Re-evaluation, 97110-Therapeutic exercises, 97530- Therapeutic activity, 97112- Neuromuscular re-education, 97535- Self Care, 02859- Manual therapy, [phone]- Gait training, Patient/Family education, Balance training, Stair training, Taping, Joint mobilization, and DME instructions.  PLAN FOR NEXT SESSION: aquatic: general strengthening (avoid excessive stretching); balance retraining; pain management  Ronal Foots) Yovanny Coats MPT 07/05/2024 12:03 PM [facility] [address] [place], [place], [address] Phone: [phone]   Fax:  [phone]   For all possible CPT codes, reference the Planned Interventions line above.     Check all conditions that are expected to impact treatment: {Conditions expected to impact treatment:Musculoskeletal disorders   If treatment provided at initial evaluation, no treatment charged due to lack of authorization.      "

## 2024-07-07 ENCOUNTER — Ambulatory Visit: Payer: Self-pay | Admitting: Cardiology

## 2024-07-08 ENCOUNTER — Encounter

## 2024-07-08 MED ORDER — SODIUM CHLORIDE 0.9 % IV BOLUS (SEPSIS)
250.0000 mL | Freq: Once | INTRAVENOUS | Status: DC
  Filled 2024-07-08: qty 250

## 2024-07-08 MED ORDER — ROSUVASTATIN CALCIUM 5 MG PO TABS: 5.0000 mg | ORAL_TABLET | Freq: Every day | ORAL | 3 refills | Status: DC

## 2024-07-11 NOTE — Progress Notes (Signed)
 This encounter was created in error - please disregard.

## 2024-07-12 ENCOUNTER — Other Ambulatory Visit (HOSPITAL_BASED_OUTPATIENT_CLINIC_OR_DEPARTMENT_OTHER): Payer: Self-pay

## 2024-07-12 ENCOUNTER — Encounter (HOSPITAL_BASED_OUTPATIENT_CLINIC_OR_DEPARTMENT_OTHER): Payer: Self-pay | Admitting: Physical Therapy

## 2024-07-12 ENCOUNTER — Ambulatory Visit (INDEPENDENT_AMBULATORY_CARE_PROVIDER_SITE_OTHER)

## 2024-07-12 ENCOUNTER — Other Ambulatory Visit: Payer: Self-pay

## 2024-07-12 ENCOUNTER — Encounter (HOSPITAL_BASED_OUTPATIENT_CLINIC_OR_DEPARTMENT_OTHER): Payer: Self-pay

## 2024-07-12 ENCOUNTER — Other Ambulatory Visit (HOSPITAL_COMMUNITY): Payer: Self-pay

## 2024-07-12 ENCOUNTER — Ambulatory Visit (HOSPITAL_BASED_OUTPATIENT_CLINIC_OR_DEPARTMENT_OTHER): Admitting: Physical Therapy

## 2024-07-12 VITALS — BP 108/73 | HR 74 | Temp 97.8°F | Resp 16 | Ht 65.0 in | Wt 168.8 lb

## 2024-07-12 DIAGNOSIS — M5459 Other low back pain: Secondary | ICD-10-CM | POA: Diagnosis not present

## 2024-07-12 DIAGNOSIS — M797 Fibromyalgia: Secondary | ICD-10-CM

## 2024-07-12 DIAGNOSIS — G90A Postural orthostatic tachycardia syndrome (POTS): Secondary | ICD-10-CM

## 2024-07-12 DIAGNOSIS — R2681 Unsteadiness on feet: Secondary | ICD-10-CM

## 2024-07-12 DIAGNOSIS — M6281 Muscle weakness (generalized): Secondary | ICD-10-CM

## 2024-07-12 MED ORDER — FAMOTIDINE 20 MG PO TABS
20.0000 mg | ORAL_TABLET | Freq: Two times a day (BID) | ORAL | 3 refills | Status: AC | PRN
  Filled 2024-07-12: qty 90, 45d supply, fill #0

## 2024-07-12 MED ORDER — ROSUVASTATIN CALCIUM 5 MG PO TABS
5.0000 mg | ORAL_TABLET | Freq: Every day | ORAL | 3 refills | Status: AC
  Filled 2024-07-12: qty 90, 90d supply, fill #0

## 2024-07-12 MED ORDER — TIZANIDINE HCL 2 MG PO TABS
2.0000 mg | ORAL_TABLET | Freq: Two times a day (BID) | ORAL | 3 refills | Status: AC | PRN
  Filled 2024-07-12: qty 30, 15d supply, fill #0

## 2024-07-12 MED ORDER — PROPRANOLOL HCL 10 MG PO TABS
10.0000 mg | ORAL_TABLET | Freq: Every day | ORAL | 3 refills | Status: AC
  Filled 2024-07-12 – 2024-08-14 (×2): qty 90, 90d supply, fill #0

## 2024-07-12 MED ORDER — PRAZOSIN HCL 1 MG PO CAPS
1.0000 mg | ORAL_CAPSULE | Freq: Every day | ORAL | 3 refills | Status: AC
  Filled 2024-07-12: qty 90, 90d supply, fill #0

## 2024-07-12 MED ORDER — CELECOXIB 200 MG PO CAPS
200.0000 mg | ORAL_CAPSULE | Freq: Two times a day (BID) | ORAL | 0 refills | Status: DC
  Filled 2024-07-12: qty 60, 30d supply, fill #0

## 2024-07-12 MED ORDER — ESTRADIOL 0.05 MG/24HR TD PTTW
1.0000 | MEDICATED_PATCH | TRANSDERMAL | 10 refills | Status: AC
  Filled 2024-07-12 – 2024-07-23 (×2): qty 8, 28d supply, fill #0

## 2024-07-12 MED ORDER — TIZANIDINE HCL 2 MG PO TABS
2.0000 mg | ORAL_TABLET | Freq: Two times a day (BID) | ORAL | 0 refills | Status: DC | PRN
  Filled 2024-07-12: qty 30, 15d supply, fill #0

## 2024-07-12 MED ORDER — EPINEPHRINE 0.3 MG/0.3ML IJ SOAJ
0.3000 mg | INTRAMUSCULAR | 1 refills | Status: AC | PRN
  Filled 2024-07-12: qty 2, 1d supply, fill #0

## 2024-07-12 MED ORDER — ATOMOXETINE HCL 10 MG PO CAPS
10.0000 mg | ORAL_CAPSULE | Freq: Every day | ORAL | 3 refills | Status: DC
  Filled 2024-07-12: qty 30, 30d supply, fill #0

## 2024-07-12 MED ORDER — AMLODIPINE BESYLATE 5 MG PO TABS
5.0000 mg | ORAL_TABLET | Freq: Every day | ORAL | 3 refills | Status: AC
  Filled 2024-07-12 – 2024-07-15 (×3): qty 90, 90d supply, fill #0

## 2024-07-12 MED ORDER — SODIUM CHLORIDE 0.9 % IV BOLUS (SEPSIS)
250.0000 mL | Freq: Once | INTRAVENOUS | Status: AC
  Administered 2024-07-12: 250 mL via INTRAVENOUS
  Filled 2024-07-12: qty 250

## 2024-07-12 MED ORDER — TOPIRAMATE 25 MG PO TABS
25.0000 mg | ORAL_TABLET | Freq: Every day | ORAL | 1 refills | Status: DC
  Filled 2024-07-12: qty 90, 90d supply, fill #0

## 2024-07-12 MED ORDER — PRAZOSIN HCL 1 MG PO CAPS
1.0000 mg | ORAL_CAPSULE | Freq: Every day | ORAL | 1 refills | Status: DC
  Filled 2024-07-12: qty 90, 90d supply, fill #0

## 2024-07-12 MED ORDER — TOPIRAMATE 25 MG PO TABS
25.0000 mg | ORAL_TABLET | Freq: Three times a day (TID) | ORAL | 2 refills | Status: AC | PRN
  Filled 2024-07-12 – 2024-07-23 (×4): qty 270, 90d supply, fill #0
  Filled 2024-07-28: qty 90, 30d supply, fill #0

## 2024-07-12 MED ORDER — DICLOFENAC SODIUM 50 MG PO TBEC
50.0000 mg | DELAYED_RELEASE_TABLET | Freq: Two times a day (BID) | ORAL | 0 refills | Status: DC
  Filled 2024-07-12: qty 60, 30d supply, fill #0

## 2024-07-12 MED ORDER — AMLODIPINE BESYLATE 10 MG PO TABS
10.0000 mg | ORAL_TABLET | Freq: Every day | ORAL | 0 refills | Status: DC
  Filled 2024-07-12: qty 90, 90d supply, fill #0

## 2024-07-12 NOTE — Therapy (Signed)
 " OUTPATIENT PHYSICAL THERAPY THORACOLUMBAR TREATMENT     Patient Name: Laurie Casey MRN: [id number] DOB:1986/06/17, 38 y.o., female Today's Date: 07/12/2024  END OF SESSION:  PT End of Session - 07/12/24 1202     Visit Number 10    Number of Visits 12    Date for Recertification  08/19/24    Authorization Type [employer]    Authorization Time Period visit #1/5 visits approved  From 11.25.2025 - 01.23.2026    Authorization - Visit Number 5    Authorization - Number of Visits 5    PT Start Time 1150    PT Stop Time 1230    PT Time Calculation (min) 40 min    Activity Tolerance Patient tolerated treatment well    Behavior During Therapy [facility] for tasks assessed/performed              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   Pre-eclampsia    PTSD (post-traumatic stress disorder) 06/30/2022   Past Surgical History:  Procedure Laterality Date   CESAREAN SECTION  2013, 2015, 2019   x2   CESAREAN SECTION  03/24/2023   with partial historectomy with uterus, cervix and tube removal   CYSTOSCOPY  2008 or 2009   CYSTOSCOPY W/ URETERAL STENT PLACEMENT Right 11/29/2020   Procedure: CYSTOSCOPY WITH RETROGRADE PYELOGRAM/URETERAL STENT PLACEMENT;  Surgeon: Matilda Senior, MD;  Location: [facility] OR;  Service: Urology;  Laterality: Right;   CYSTOSCOPY/URETEROSCOPY/HOLMIUM LASER/STENT PLACEMENT Right 12/13/2020   Procedure: CYSTOSCOPY RIGHT URETEROSCOPY/HOLMIUM LASER/STENT EXTRACTION AND RIGHT JJ STENT PLACEMENT, RIGHT RETROGRADE URETEROSCOPY;  Surgeon: Matilda Senior, MD;  Location: [facility];  Service: Urology;  Laterality: Right;   URETER SURGERY     x2   WISDOM TOOTH EXTRACTION      Patient Active Problem List   Diagnosis Date Noted   POTS (postural orthostatic tachycardia syndrome) 05/17/2024   Placenta accreta 12/03/2022   History of cesarean section 10/01/2022   Essential hypertension 08/11/2022   Obesity (BMI 30.0-34.9) 08/11/2022   High risk multigravida in third trimester 09/10/2017   Hx of preeclampsia, prior pregnancy, currently pregnant 03/16/2014    PCP: Barnie Louder MD  REFERRING PROVIDER:   Lorilee Sven SQUIBB, MD    REFERRING DIAG: M79.7 (ICD-10-CM) - Fibromyalgia   Rationale for Evaluation and Treatment: Rehabilitation  THERAPY DIAG:  Other low back pain  Muscle weakness (generalized)  Unsteadiness on feet  Fibromyalgia  ONSET DATE: chronic  SUBJECTIVE:  SUBJECTIVE STATEMENT: Have  not gotten the results back from heart monitor.  My body has missed the pool bad. Pain at night has been bad. pain is 6/10   Initial evaluation:  Fall in June hurt left ankle.  Having a hard time healing.  Ortho put me on a heavy boot for 4 months and it made my back hurt and made ankle worse.  I am in constant pain with my fibro.  Worst in my back.  Get trigger point injection in my back, a lot of joint pain. It feels hot.  Hard to explain. Have a 1 yr old/4 children. We are selling and buying a house. (External stress). Pain interrupts sleep >3 x night. Had abdominal surgery in Sept 2024 and have no core strength  PERTINENT HISTORY:  fibromyalgia  PAIN:  Are you having pain? Yes: NPRS scale: current 3/10 generalized; 6/10 shoulders Pain location: see above Pain description: tense, sharp and dull Aggravating factors: stress Relieving factors: meds; heat; TENs unit, pressure point mat  PRECAUTIONS: None  RED FLAGS: None   WEIGHT BEARING RESTRICTIONS:  No  FALLS:  Has patient fallen in last 6 months? Yes 2: fell down stairs; fell in shower  LIVING ENVIRONMENT: Lives with: lives with their family Lives in: House/apartment Stairs: No Has following equipment at home: None  OCCUPATION: stay at home Mom  PLOF: Independent  PATIENT GOALS: increase toleration to movement; get back to exercise  NEXT MD VISIT: intermittent  OBJECTIVE:  Note: Objective measures were completed at Evaluation unless otherwise noted.   PATIENT SURVEYS:  ODI:30/50=60% 12/9:  27/50=54%  COGNITION: Overall cognitive status: Within functional limits for tasks assessed       POSTURE: increased thoracic kyphosis/ hypermobile   LUMBAR ROM:   hypermobile  LOWER EXTREMITY ROM:     Active  Right eval Left eval  Hip flexion    Hip extension    Hip abduction    Hip adduction    Hip internal rotation    Hip external rotation    Knee flexion    Knee extension hyper hyper  Ankle dorsiflexion    Ankle plantarflexion    Ankle inversion    Ankle eversion     (Blank rows = not tested)  LOWER EXTREMITY MMT:     Tested in sitting HD lbs Right eval Left eval R / L 06/21/24  Hip flexion 29.1 21.9 40.0 / 40.7  Hip extension     Hip abduction 18.5 17.1 15.3 / 13.4  Hip adduction     Hip internal rotation     Hip external rotation     Knee flexion     Knee extension 12.8 22.0   Ankle dorsiflexion     Ankle plantarflexion     Ankle inversion     Ankle eversion      (Blank rows = not tested)   FUNCTIONAL TESTS:  Timed up and go (TUG): 14.17   4 stage balance:passed      12/8:  TUG 11.35 GAIT: Distance walked: 400 ft Assistive device utilized: None Level of assistance: Complete Independence Comments: antalgic upon initiation of gait  TREATMENT  [facility] Adult PT Treatment:                                             Date: 07/12/24  Pt seen for aquatic therapy today.  Treatment took  place in water  3.5-4.75 ft in depth at the [facility] pool. Temp of water  was 91.  Pt entered/exited the pool via stairs independently with bil rail.  -walking forward, back and side stepping unsupported - prone suspension with noodle wrapped anteriorly across chest: -hip add/abd; knees to chest 2 x5 - with light resistance bells: walking forward with reciprocal arm swing; side stepping with arm add/abdct: squatted rest; marching forward with row motion  - Unsupported:  toe/heel raises x10; hip add/abd x10 ; open book x 5 R/L - core engagement working sitting balance on yellow noodle with ue lifts - decompression using noodle wrapped posteriorly-> between legs: cycling  Pt requires the buoyancy and hydrostatic pressure of water  for support, and to offload joints by unweighting joint load by at least 50 % in navel deep water  and by at least 75-80% in chest to neck deep water .  Viscosity of the water  is needed for resistance of strengthening. Water  current perturbations provides challenge to standing balance requiring increased core activation.   PATIENT EDUCATION:  Education details: intro to aquatic therapy  Person educated: Patient Education method: Explanation Education comprehension: verbalized understanding  HOME EXERCISE PROGRAM: Aquatic tba  ASSESSMENT:  CLINICAL IMPRESSION: Pt with improving toleration of therapy using the properties of water . She continues to report reduction in pain in prone suspension.  Able to complete exercises in position that allow for increased ROM without increasing pain. She demonstrates reduced guarded posture and more fluid movements in and out of pool with ambulation and function. Pacing of activity slowing increasing as pain sensitivity reduces. She will continue to benefit from skilled aquatic therapy intervention to progress towards stated rehab goals.   PN: functional and objective testing today completed with improvement in hip flex strength and TUG score as noted in charts above.  This  indicates progression with transitional movements balance. Her ODI score has improved by 6% indicating an improvement in pt function. She reports she feels energized after sessions which last for about 4 days.  She  arrives with heart monitor donned.  She is unable to get submerged much > then waist deep.  Decided to cancel next appt as the monitor with still be in use and pt requires deep water  for optimal benefit from aquatic session.       OBJECTIVE IMPAIRMENTS: decreased activity tolerance, decreased balance, decreased strength, and pain.   ACTIVITY LIMITATIONS: carrying, lifting, bending, sitting, standing, squatting, stairs, transfers, locomotion level, and caring for others  PARTICIPATION LIMITATIONS: meal prep, cleaning, shopping, community activity, occupation, and yard work  PERSONAL FACTORS: Age and Past/current experiences are also affecting patient's functional outcome.   REHAB POTENTIAL: Good  CLINICAL DECISION MAKING: Stable/uncomplicated  EVALUATION COMPLEXITY: Low   GOALS: Goals reviewed with patient? No  SHORT TERM GOALS: Target date: 10/30  Pt will tolerate full aquatic sessions consistently without increase in pain and with improving function to demonstrate good toleration and effectiveness of intervention.  Baseline: Goal status: In progress - 05/17/24;  Met 05/31/24  2.  Pt will report a reduction in pain while submerged by at least 50% to demonstrate using the properties of water  for pain management Baseline:  Goal status: in progress - 05/17/24; Met 05/31/24    LONG TERM GOALS: Target date: 08/19/24  Pt to improve on ODI by 13% to demonstrate statistically significant Improvement in function. (MCID 13-15%) Baseline: 30/50=60%; 54% Goal status: In progress 06/21/24  2.  Pt will report decrease in pain by at least 50% for  improved toleration to activity/quality of life and to demonstrate improved management of pain. Baseline: see chart Goal status: In  progress 05/31/24; 06/21/24  3.  Pt will improve strength in hips and knees within 5 lbs of contralateral side to demonstrate improved overall physical function Baseline: see chart Goal status: In progress 06/21/24  4.  Pt will report improved toleration to activity by 50% Baseline: see chart Goal status: In progress 06/21/24  5.  Pt will consider gaining pool access for use of the properties of water  for chronic conditions maintaining mobility and minimizing pain. Baseline: none Goal status: In progress 05/31/24; 06/21/24; Met 07/12/24    PLAN:  PT FREQUENCY: 1-2x/week  PT DURATION: 8 weeks  PLANNED INTERVENTIONS: 97164- PT Re-evaluation, 97110-Therapeutic exercises, 97530- Therapeutic activity, 97112- Neuromuscular re-education, 97535- Self Care, [id number]- Manual therapy, [id number]- Gait training, Patient/Family education, Balance training, Stair training, Taping, Joint mobilization, and DME instructions.  PLAN FOR NEXT SESSION: aquatic: general strengthening (avoid excessive stretching); balance retraining; pain management  Ronal Foots) Naquita Nappier MPT 07/12/2024 12:03 PM [facility] [address] [place], [place], [address] Phone: [phone]   Fax:  [phone]   For all possible CPT codes, reference the Planned Interventions line above.     Check all conditions that are expected to impact treatment: {Conditions expected to impact treatment:Musculoskeletal disorders   If treatment provided at initial evaluation, no treatment charged due to lack of authorization.      "

## 2024-07-12 NOTE — Progress Notes (Signed)
 Diagnosis: POTS (postural orthostatic tachycardia syndrome)   Provider:  Praveen Mannam MD  Procedure: IV Infusion  IV Type: Peripheral, IV Location: R Antecubital   Normal Saline, Dose: 250 mg  Infusion Start Time: 1416  Infusion Stop Time: 1517  Post Infusion IV Care: Peripheral IV Discontinued  Discharge: Condition: Good, Destination: Home . AVS Declined  Performed by:  Maximiano JONELLE Pouch, LPN

## 2024-07-13 ENCOUNTER — Other Ambulatory Visit (HOSPITAL_COMMUNITY): Payer: Self-pay

## 2024-07-13 ENCOUNTER — Other Ambulatory Visit (HOSPITAL_BASED_OUTPATIENT_CLINIC_OR_DEPARTMENT_OTHER): Payer: Self-pay

## 2024-07-15 ENCOUNTER — Other Ambulatory Visit (HOSPITAL_BASED_OUTPATIENT_CLINIC_OR_DEPARTMENT_OTHER): Payer: Self-pay

## 2024-07-15 ENCOUNTER — Ambulatory Visit: Admitting: *Deleted

## 2024-07-15 VITALS — BP 122/87 | HR 71 | Temp 97.4°F | Resp 14 | Ht 65.0 in | Wt 169.2 lb

## 2024-07-15 DIAGNOSIS — G90A Postural orthostatic tachycardia syndrome (POTS): Secondary | ICD-10-CM

## 2024-07-15 MED ORDER — SODIUM CHLORIDE 0.9 % IV BOLUS (SEPSIS)
250.0000 mL | Freq: Once | INTRAVENOUS | Status: AC
  Administered 2024-07-15: 250 mL via INTRAVENOUS
  Filled 2024-07-15: qty 250

## 2024-07-15 NOTE — Progress Notes (Signed)
 Diagnosis: POTS  Provider:  Mannam, Praveen MD  Procedure: IV Infusion  IV Type: Peripheral, IV Location: R Antecubital   Normal Saline, Dose: 250 mg  Infusion Start Time: 1423 pm  Infusion Stop Time: 1531 pm  Post Infusion IV Care: Observation period completed and Peripheral IV Discontinued  Discharge: Condition: Good, Destination: Home . AVS Declined  Performed by:  Trudy Lamarr LABOR, RN

## 2024-07-17 ENCOUNTER — Other Ambulatory Visit: Payer: Self-pay | Admitting: Physical Medicine and Rehabilitation

## 2024-07-17 MED ORDER — PREGABALIN 50 MG PO CAPS
50.0000 mg | ORAL_CAPSULE | Freq: Every evening | ORAL | 3 refills | Status: AC
  Filled 2024-07-17: qty 30, 30d supply, fill #0
  Filled 2024-08-14: qty 30, 30d supply, fill #1

## 2024-07-18 ENCOUNTER — Other Ambulatory Visit (HOSPITAL_BASED_OUTPATIENT_CLINIC_OR_DEPARTMENT_OTHER): Payer: Self-pay

## 2024-07-18 ENCOUNTER — Other Ambulatory Visit (HOSPITAL_COMMUNITY): Payer: Self-pay

## 2024-07-19 ENCOUNTER — Ambulatory Visit

## 2024-07-19 MED ORDER — SODIUM CHLORIDE 0.9 % IV BOLUS (SEPSIS)
250.0000 mL | Freq: Once | INTRAVENOUS | Status: DC
  Filled 2024-07-19: qty 250

## 2024-07-20 ENCOUNTER — Encounter (HOSPITAL_BASED_OUTPATIENT_CLINIC_OR_DEPARTMENT_OTHER): Payer: Self-pay | Admitting: Physical Therapy

## 2024-07-20 ENCOUNTER — Other Ambulatory Visit: Payer: Self-pay | Admitting: Physical Medicine and Rehabilitation

## 2024-07-20 ENCOUNTER — Encounter: Payer: Self-pay | Admitting: Physical Medicine and Rehabilitation

## 2024-07-20 DIAGNOSIS — K921 Melena: Secondary | ICD-10-CM

## 2024-07-21 DIAGNOSIS — R002 Palpitations: Secondary | ICD-10-CM | POA: Diagnosis not present

## 2024-07-24 ENCOUNTER — Other Ambulatory Visit: Payer: Self-pay

## 2024-07-25 ENCOUNTER — Ambulatory Visit: Attending: Internal Medicine | Admitting: Internal Medicine

## 2024-07-25 ENCOUNTER — Other Ambulatory Visit: Payer: Self-pay

## 2024-07-25 ENCOUNTER — Encounter: Payer: Self-pay | Admitting: Internal Medicine

## 2024-07-25 VITALS — BP 107/73 | HR 86 | Temp 97.8°F | Ht 65.0 in | Wt 171.0 lb

## 2024-07-25 DIAGNOSIS — E611 Iron deficiency: Secondary | ICD-10-CM

## 2024-07-25 DIAGNOSIS — K625 Hemorrhage of anus and rectum: Secondary | ICD-10-CM

## 2024-07-25 DIAGNOSIS — E559 Vitamin D deficiency, unspecified: Secondary | ICD-10-CM | POA: Diagnosis not present

## 2024-07-25 DIAGNOSIS — G90A Postural orthostatic tachycardia syndrome (POTS): Secondary | ICD-10-CM

## 2024-07-25 DIAGNOSIS — F330 Major depressive disorder, recurrent, mild: Secondary | ICD-10-CM | POA: Diagnosis not present

## 2024-07-25 DIAGNOSIS — Z23 Encounter for immunization: Secondary | ICD-10-CM

## 2024-07-25 DIAGNOSIS — E782 Mixed hyperlipidemia: Secondary | ICD-10-CM | POA: Diagnosis not present

## 2024-07-25 DIAGNOSIS — I10 Essential (primary) hypertension: Secondary | ICD-10-CM

## 2024-07-25 NOTE — Progress Notes (Signed)
 "   Patient ID: Laurie Casey, female    DOB: 1986-04-16  MRN: [id number]  CC: Hypertension (HTN f/u. Lurlene stools, blood when passing gas - possible family hx of colon cancer /Flu vax administered on 07/26/23 - C.A.)   Subjective: Laurie Casey is a 39 y.o. female who presents for chronic ds management. Her chronic medical issues include:  Patient with history of asthma, former smoker, anxiety disorder, migraines, preeclampsia, ureteral stone, fibromyalgia, PTSD/GAD/MDD (Followed by psychiatrist Prentice Orn, MD and therapist Nia at [facility]), nephrolithiasis   Discussed the use of AI scribe software for clinical note transcription with the patient, who gave verbal consent to proceed.  History of Present Illness Laurie Casey is a 39 year old female who presents for a four-month follow-up visit for  Chronic ds management  Refeerred to cardiology for possible POTS. She underwent an echocardiogram which revealed no significant structural HD and wore a heart monitor for two weeks. The monitor showed four beats of wide complex tachycardia, but symptoms were mostly associated with normal sinus rhythm. The monitoring period was uneventful per pt with no significant feelings of palpitations.  HTN/HL: She is currently taking amlodipine  5 mg, reduced from 10 mg due to low blood pressure, and propranolol  once a day for rapid heartbeat. She is also on rosuvastatin  at night for cholesterol, which causes musculoskeletal pain, but she prefers to continue due to its effectiveness especially upon learning of elev Lipo-A level. She has a family history of high cholesterol, with her father and grandfather affected, and her grandfather mentioned a family history of heart disease.  She takes vitamin D3 (Vit D level was 24.8 in Dec 2025) plus K2, 1000 mg, for low vitamin D  levels and has increased dietary iron intake due to stomach issues with iron supplements.  She has  experienced rectal bleeding for the past four to six weeks, with small blood clots and fresh blood on toilet paper, even when passing gas. Bowel movements are irregular and sometimes encased in dark red blood. There is a history of external hemorrhoids related to constipation in past but does not feel any hemorrhoids outside or inside rectum and stools have been soft. Her biological mother reportedly had colon cancer, but this information is unreliable as it was relayed through her sister and the patient is unsure of its accuracy.  MDD/GAD: She is on Cymbalta and Wellbutrin for depression, taking 200 mg of Wellbutrin twice a day and 30 mg of Cymbalta twice a day. Feels she is doing well and stable on these meds. No thoughts of self-harm or harm to others. Continues to follow with her psychiatrist.   She started hormone replacement therapy with an estradiol  patch, by her GYN which has helped with her symptoms.     Patient Active Problem List   Diagnosis Date Noted   POTS (postural orthostatic tachycardia syndrome) 05/17/2024   Placenta accreta 12/03/2022   History of cesarean section 10/01/2022   Essential hypertension 08/11/2022   Obesity (BMI 30.0-34.9) 08/11/2022   High risk multigravida in third trimester 09/10/2017   Hx of preeclampsia, prior pregnancy, currently pregnant 03/16/2014     Medications Ordered Prior to Encounter[1]  Allergies[2]  Social History   Socioeconomic History   Marital status: Significant Other    Spouse name: Not on file   Number of children: 3   Years of education: Not on file   Highest education level: Associate degree: occupational, scientist, product/process development, or vocational program  Occupational History  Occupation: Museum/gallery conservator  Tobacco Use   Smoking status: Former    Current packs/day: 0.00    Average packs/day: 0.3 packs/day    Types: Cigarettes    Quit date: 10/12/2020    Years since quitting: 3.7   Smokeless tobacco: Never   Tobacco comments:    off and on    Vaping Use   Vaping status: Never Used  Substance and Sexual Activity   Alcohol use: Not Currently   Drug use: Never   Sexual activity: Not on file  Other Topics Concern   Not on file  Social History Narrative   Not on file   Social Drivers of Health   Tobacco Use: Medium Risk (07/12/2024)   Patient History    Smoking Tobacco Use: Former    Smokeless Tobacco Use: Never    Passive Exposure: Not on file  Financial Resource Strain: Low Risk (06/05/2024)   Overall Financial Resource Strain (CARDIA)    Difficulty of Paying Living Expenses: Not very hard  Food Insecurity: No Food Insecurity (06/05/2024)   Epic    Worried About Radiation Protection Practitioner of Food in the Last Year: Never true    Ran Out of Food in the Last Year: Never true  Transportation Needs: No Transportation Needs (06/05/2024)   Epic    Lack of Transportation (Medical): No    Lack of Transportation (Non-Medical): No  Physical Activity: Inactive (06/05/2024)   Exercise Vital Sign    Days of Exercise per Week: 0 days    Minutes of Exercise per Session: Not on file  Stress: Stress Concern Present (06/05/2024)   [employer] of Occupational Health - Occupational Stress Questionnaire    Feeling of Stress: Very much  Social Connections: Socially Isolated (06/05/2024)   Social Connection and Isolation Panel    Frequency of Communication with Friends and Family: Never    Frequency of Social Gatherings with Friends and Family: Never    Attends Religious Services: Never    Database Administrator or Organizations: No    Attends Engineer, Structural: Not on file    Marital Status: Divorced  Intimate Partner Violence: Not At Risk (07/23/2023)   Humiliation, Afraid, Rape, and Kick questionnaire    Fear of Current or Ex-Partner: No    Emotionally Abused: No    Physically Abused: No    Sexually Abused: No  Depression (PHQ2-9): Medium Risk (07/25/2024)   Depression (PHQ2-9)    PHQ-2 Score: 9  Alcohol Screen: Low Risk  (07/23/2023)   Alcohol Screen    Last Alcohol Screening Score (AUDIT): 0  Housing: Low Risk (06/05/2024)   Epic    Unable to Pay for Housing in the Last Year: No    Number of Times Moved in the Last Year: 0    Homeless in the Last Year: No  Utilities: Not At Risk (07/23/2023)   [employer] Utilities    Threatened with loss of utilities: No  Health Literacy: Adequate Health Literacy (07/23/2023)   B1300 Health Literacy    Frequency of need for help with medical instructions: Never    Family History  Problem Relation Age of Onset   Hypertension Father     Past Surgical History:  Procedure Laterality Date   CESAREAN SECTION  2013, 2015, 2019   x2   CESAREAN SECTION  03/24/2023   with partial historectomy with uterus, cervix and tube removal   CYSTOSCOPY  2008 or 2009   CYSTOSCOPY W/ URETERAL STENT PLACEMENT Right 11/29/2020   Procedure:  CYSTOSCOPY WITH RETROGRADE PYELOGRAM/URETERAL STENT PLACEMENT;  Surgeon: Matilda Senior, MD;  Location: [facility] OR;  Service: Urology;  Laterality: Right;   CYSTOSCOPY/URETEROSCOPY/HOLMIUM LASER/STENT PLACEMENT Right 12/13/2020   Procedure: CYSTOSCOPY RIGHT URETEROSCOPY/HOLMIUM LASER/STENT EXTRACTION AND RIGHT JJ STENT PLACEMENT, RIGHT RETROGRADE URETEROSCOPY;  Surgeon: Matilda Senior, MD;  Location: [facility] ORS;  Service: Urology;  Laterality: Right;   URETER SURGERY     x2   WISDOM TOOTH EXTRACTION      ROS: Review of Systems Negative except as stated above  PHYSICAL EXAM: BP 107/73 (BP Location: Left Arm, Patient Position: Sitting, Cuff Size: Normal)   Pulse 86   Temp 97.8 F (36.6 C) (Oral)   Ht 5' 5 (1.651 m)   Wt 171 lb (77.6 kg)   LMP 07/19/2022 Comment: Had baby in September  SpO2 99%   BMI 28.46 kg/m   Wt Readings from Last 3 Encounters:  07/25/24 171 lb (77.6 kg)  07/15/24 169 lb 3.2 oz (76.7 kg)  07/12/24 168 lb 12.8 oz (76.6 kg)    Physical Exam  General appearance - alert, well appearing, and in no distress Mental status -  normal mood, behavior, speech, dress, motor activity, and thought processes Neck - supple, no significant adenopathy Chest - clear to auscultation, no wheezes, rales or rhonchi, symmetric air entry Heart - normal rate, regular rhythm, normal S1, S2, no murmurs, rubs, clicks or gallops Rectal - CMA Clarisa present: no external lesions. No stools or masses felt in rectal vault. +blood noted on my gloved finger after insertion in rectum. Heme occult pos Extremities - no LE edema      07/25/2024   12:10 PM 05/05/2024    1:50 PM 04/21/2024    9:51 AM  Depression screen PHQ 2/9  Decreased Interest 1 2 3   Down, Depressed, Hopeless 1 2 3   PHQ - 2 Score 2 4 6   Altered sleeping 2 2 2   Tired, decreased energy 3 3 3   Change in appetite 2 3 2   Feeling bad or failure about yourself  0 0 0  Trouble concentrating 0 2 0  Moving slowly or fidgety/restless 0 0 0  Suicidal thoughts 0 0 0  PHQ-9 Score 9 14  13    Difficult doing work/chores Somewhat difficult  Very difficult     Data saved with a previous flowsheet row definition       Latest Ref Rng & Units 04/18/2024    7:27 AM 03/22/2024   12:25 PM 10/01/2022   10:04 AM  CMP  Glucose 70 - 99 mg/dL 889  76  88   BUN 6 - 20 mg/dL 14  13  11    Creatinine 0.44 - 1.00 mg/dL 8.85  8.85  9.27   Sodium 135 - 145 mmol/L 140  141  137   Potassium 3.5 - 5.1 mmol/L 3.9  4.4  4.2   Chloride 98 - 111 mmol/L 109  108  106   CO2 22 - 32 mmol/L 21  20  16    Calcium  8.9 - 10.3 mg/dL 9.5  9.7  9.6   Total Protein 6.5 - 8.1 g/dL 6.3  6.8  6.7   Total Bilirubin 0.0 - 1.2 mg/dL <9.7  0.3  0.3   Alkaline Phos 38 - 126 U/L 99  116  74   AST 15 - 41 U/L 14  12  23    ALT 0 - 44 U/L 6  9  16     Lipid Panel     Component Value Date/Time  CHOL 263 (H) 06/16/2024 1214   TRIG 94 06/16/2024 1214   HDL 53 06/16/2024 1214   CHOLHDL 5.0 (H) 06/16/2024 1214   LDLCALC 194 (H) 06/16/2024 1214   Lab Results  Component Value Date   IRON 48 04/21/2024   TIBC 254  04/21/2024   FERRITIN 55 04/21/2024     CBC    Component Value Date/Time   WBC 8.2 04/21/2024 1059   WBC 10.3 04/18/2024 0727   RBC 4.26 04/21/2024 1059   RBC 4.13 04/18/2024 0727   HGB 13.4 04/21/2024 1059   HCT 40.9 04/21/2024 1059   PLT 179 04/21/2024 1059   MCV 96 04/21/2024 1059   MCH 31.5 04/21/2024 1059   MCH 31.0 04/18/2024 0727   MCHC 32.8 04/21/2024 1059   MCHC 33.1 04/18/2024 0727   RDW 12.4 04/21/2024 1059   LYMPHSABS 2.1 04/18/2024 0727   LYMPHSABS 2.4 10/01/2022 1004   MONOABS 1.1 (H) 04/18/2024 0727   EOSABS 0.4 04/18/2024 0727   EOSABS 0.3 10/01/2022 1004   BASOSABS 0.1 04/18/2024 0727   BASOSABS 0.1 10/01/2022 1004    ASSESSMENT AND PLAN: 1. Essential hypertension (Primary) At goal. Continue Norvasc  5 mg daily  2. Mixed hyperlipidemia Discussed trying her with a different statin medication if the rosuvastatin  is causing muscle soreness with patient declined stating that she prefers to stay on it if it is more effective than other statins.  3. POTS (postural orthostatic tachycardia syndrome) Seen by cardiology Continue low dose Propranolol . Await cardiology review of holter report  4. Rectal bleeding - Ambulatory referral to Gastroenterology - CBC  5. Vitamin D  deficiency Advise pt that instead of Vit D 1000 international units she should take 400IU daily,   6. Major depressive disorder, recurrent episode, mild PHQ9 done today in office  level lower today compared to 3 mths ago. Stable on meds. Continue f/u with her psychiatrist  7. Iron deficiency - Iron, TIBC and Ferritin Panel  8. Need for immunization against influenza - Flu vaccine trivalent PF, 6mos and older(Flulaval,Afluria,Fluarix,Fluzone)   Patient was given the opportunity to ask questions.  Patient verbalized understanding of the plan and was able to repeat key elements of the plan.   This documentation was completed using Paediatric nurse.  Any transcriptional  errors are unintentional.  Orders Placed This Encounter  Procedures   Flu vaccine trivalent PF, 6mos and older(Flulaval,Afluria,Fluarix,Fluzone)   CBC   Iron, TIBC and Ferritin Panel   Ambulatory referral to Gastroenterology     Requested Prescriptions    No prescriptions requested or ordered in this encounter    Return in about 5 months (around 12/23/2024).  Barnie Louder, MD, FACP     [1]  Current Outpatient Medications on File Prior to Visit  Medication Sig Dispense Refill   amLODipine  (NORVASC ) 5 MG tablet Take 1 tablet (5 mg total) by mouth daily. 90 tablet 3   buPROPion (WELLBUTRIN SR) 200 MG 12 hr tablet Take 200 mg by mouth 2 (two) times daily.     DULoxetine (CYMBALTA) 30 MG capsule Take 30 mg by mouth 2 (two) times daily.     EPINEPHrine  0.3 mg/0.3 mL IJ SOAJ injection Inject 0.3 mg into the muscle as needed for anaphylaxis. 2 each 1   estradiol  (VIVELLE -DOT) 0.05 MG/24HR patch Place 1 patch (0.05 mg total) onto the skin 2 (two) times a week. 8 patch 10   famotidine  (PEPCID ) 20 MG tablet Take 1 tablet (20 mg total) by mouth 2 (two) times daily  as needed. 90 tablet 3   Multiple Vitamin (MULTIVITAMIN) tablet Take 1 tablet by mouth daily.     NALTREXONE HCL, PAIN, PO      prazosin  (MINIPRESS ) 1 MG capsule Take 1 capsule (1 mg total) by mouth at bedtime. 90 capsule 3   prazosin  (MINIPRESS ) 1 MG capsule Take 1 capsule (1 mg total) by mouth at bedtime. 90 capsule 3   pregabalin  (LYRICA ) 50 MG capsule Take 1 capsule (50 mg total) by mouth at bedtime. 90 capsule 3   propranolol  (INDERAL ) 10 MG tablet Take 1 tablet (10 mg total) by mouth daily. 90 tablet 3   rosuvastatin  (CRESTOR ) 5 MG tablet Take 1 tablet (5 mg total) by mouth daily. 90 tablet 3   tiZANidine  (ZANAFLEX ) 2 MG tablet Take 1 tablet (2 mg total) by mouth 2 (two) times daily as needed for muscle spasms. 30 tablet 3   topiramate  (TOPAMAX ) 25 MG tablet Take 1 tablet (25 mg total) by mouth 3 (three) times daily as  needed. 270 tablet 2   topiramate  (TOPAMAX ) 25 MG tablet Take 1 tablet (25 mg total) by mouth at bedtime. 90 tablet 1   XANAX  0.25 MG tablet Take 0.25 mg by mouth as needed.     EPINEPHrine  0.3 mg/0.3 mL IJ SOAJ injection Inject 0.3 mg into the muscle as needed for anaphylaxis. (Patient not taking: Reported on 07/25/2024) 1 each 1   estradiol  (VIVELLE -DOT) 0.05 MG/24HR patch Place 1 patch (0.05 mg total) onto the skin 2 (two) times a week. (Patient not taking: Reported on 07/25/2024) 8 patch 12   famotidine  (PEPCID ) 20 MG tablet Take 1 tablet (20 mg total) by mouth 2 (two) times daily as needed for heartburn or indigestion. (Patient not taking: Reported on 07/25/2024) 90 tablet 3   prazosin  (MINIPRESS ) 1 MG capsule Take 1 capsule (1 mg total) by mouth at bedtime. 90 capsule 1   Current Facility-Administered Medications on File Prior to Visit  Medication Dose Route Frequency Provider Last Rate Last Admin   sodium chloride  0.9 % bolus 250 mL  250 mL Intravenous Once Raulkar, Krutika P, MD      [2]  Allergies Allergen Reactions   Tape Rash    Adhesive Develop SOB, hives and itching when exposed to adhesive glue see note 11/2023   "

## 2024-07-25 NOTE — Patient Instructions (Signed)
" °  VISIT SUMMARY: During your visit, we reviewed your recent heart monitoring results, discussed your current medications, and addressed your concerns about rectal bleeding. We also reviewed your blood pressure, cholesterol levels, and vitamin D  and iron deficiencies. Additionally, we discussed your ongoing treatment for depression and hormone replacement therapy.  YOUR PLAN: -RECTAL BLEEDING: You have been experiencing rectal bleeding with clots and fresh blood. We did not detect any hemorrhoids, so we need to investigate other possible causes. You have been referred to a gastroenterologist for further evaluation, and we have ordered blood tests to check for anemia.  -ESSENTIAL HYPERTENSION: Your blood pressure is well-controlled with your current dose of amlodipine . We will continue to monitor your blood pressure regularly to ensure it remains stable.  -MIXED HYPERLIPIDEMIA WITH ELEVATED LIPOPROTEIN(A): Your cholesterol levels are elevated, but you prefer to continue taking rosuvastatin  despite experiencing some muscle pain. We will keep monitoring your cholesterol levels to manage this condition effectively.  -POSTURAL ORTHOSTATIC TACHYCARDIA SYNDROME (POTS): You have episodes of rapid heartbeat, but your heart rhythm is normal. You are taking propranolol  to help control your heart rate, and we are waiting for the results of your echocardiogram.  -MAJOR DEPRESSIVE DISORDER, RECURRENT: Your depression is being managed with Cymbalta and Wellbutrin, and you are doing well without any thoughts of self-harm. We will continue your current medications.  -VITAMIN D  DEFICIENCY: Your vitamin D  levels are low, and you are taking a vitamin D3 plus K2 supplement. Please continue taking this supplement as directed.  -IRON DEFICIENCY: You have had low iron levels in the past, and oral supplements caused stomach issues. You are now increasing your dietary intake of iron-rich foods, and we will recheck your iron  levels.  -GENERAL HEALTH MAINTENANCE: You received your flu shot today. It is important to stay up to date with vaccinations to maintain your overall health.  INSTRUCTIONS: Please follow up with the gastroenterologist as referred for your rectal bleeding. Continue monitoring your blood pressure and cholesterol levels as discussed. Keep taking your current medications and supplements as directed. We will recheck your iron levels and await the results of your echocardiogram. If you have any new symptoms or concerns, please contact our office.                      Contains text generated by [employer]bridge.                                 Contains text generated by [employer]bridge.   "

## 2024-07-26 ENCOUNTER — Ambulatory Visit (HOSPITAL_COMMUNITY)
Admission: RE | Admit: 2024-07-26 | Discharge: 2024-07-26 | Disposition: A | Discharge status: Home / Self Care | Source: Ambulatory Visit | Attending: Cardiology | Admitting: Cardiology

## 2024-07-26 ENCOUNTER — Encounter: Payer: Self-pay | Admitting: Cardiology

## 2024-07-26 ENCOUNTER — Other Ambulatory Visit: Payer: Self-pay

## 2024-07-26 ENCOUNTER — Ambulatory Visit: Payer: Self-pay | Admitting: Internal Medicine

## 2024-07-26 DIAGNOSIS — I119 Hypertensive heart disease without heart failure: Secondary | ICD-10-CM | POA: Insufficient documentation

## 2024-07-26 LAB — ECHOCARDIOGRAM COMPLETE
Area-P 1/2: 3.91 cm2
S' Lateral: 2.6 cm

## 2024-07-26 LAB — IRON,TIBC AND FERRITIN PANEL
Ferritin: 46 ng/mL (ref 15–150)
Iron Saturation: 25 % (ref 15–55)
Iron: 75 ug/dL (ref 27–159)
Total Iron Binding Capacity: 295 ug/dL (ref 250–450)
UIBC: 220 ug/dL (ref 131–425)

## 2024-07-26 LAB — CBC
Hematocrit: 41.8 % (ref 34.0–46.6)
Hemoglobin: 13.6 g/dL (ref 11.1–15.9)
MCH: 31.3 pg (ref 26.6–33.0)
MCHC: 32.5 g/dL (ref 31.5–35.7)
MCV: 96 fL (ref 79–97)
Platelets: 230 x10E3/uL (ref 150–450)
RBC: 4.35 x10E6/uL (ref 3.77–5.28)
RDW: 11.8 % (ref 11.7–15.4)
WBC: 10.8 x10E3/uL (ref 3.4–10.8)

## 2024-07-27 ENCOUNTER — Encounter: Payer: Self-pay | Admitting: Internal Medicine

## 2024-07-27 ENCOUNTER — Encounter: Payer: Self-pay | Admitting: Pharmacist

## 2024-07-27 ENCOUNTER — Other Ambulatory Visit: Payer: Self-pay

## 2024-07-27 ENCOUNTER — Other Ambulatory Visit: Payer: Self-pay | Admitting: Internal Medicine

## 2024-07-27 DIAGNOSIS — Z800 Family history of malignant neoplasm of digestive organs: Secondary | ICD-10-CM | POA: Insufficient documentation

## 2024-07-28 ENCOUNTER — Ambulatory Visit (HOSPITAL_BASED_OUTPATIENT_CLINIC_OR_DEPARTMENT_OTHER): Payer: Self-pay | Attending: Physical Medicine and Rehabilitation | Admitting: Physical Therapy

## 2024-07-28 ENCOUNTER — Encounter: Payer: Self-pay | Admitting: Gastroenterology

## 2024-07-28 ENCOUNTER — Other Ambulatory Visit: Payer: Self-pay

## 2024-07-28 ENCOUNTER — Other Ambulatory Visit (HOSPITAL_COMMUNITY): Payer: Self-pay

## 2024-07-28 ENCOUNTER — Encounter (HOSPITAL_BASED_OUTPATIENT_CLINIC_OR_DEPARTMENT_OTHER): Payer: Self-pay | Admitting: Physical Therapy

## 2024-07-28 DIAGNOSIS — M5459 Other low back pain: Secondary | ICD-10-CM | POA: Diagnosis present

## 2024-07-28 DIAGNOSIS — M6281 Muscle weakness (generalized): Secondary | ICD-10-CM | POA: Insufficient documentation

## 2024-07-28 DIAGNOSIS — R2681 Unsteadiness on feet: Secondary | ICD-10-CM | POA: Diagnosis present

## 2024-07-28 DIAGNOSIS — M797 Fibromyalgia: Secondary | ICD-10-CM | POA: Diagnosis present

## 2024-07-28 NOTE — Therapy (Signed)
 " OUTPATIENT PHYSICAL THERAPY THORACOLUMBAR TREATMENT     Patient Name: Laurie Casey MRN: [id number] DOB:07-25-1985, 39 y.o., female Today's Date: 07/28/2024  END OF SESSION:  PT End of Session - 07/28/24 1101     Visit Number 11    Date for Recertification  08/19/24    Authorization Type [employer]    Authorization Time Period From 01.05.2026 - 03.05.2026    Authorization - Visit Number 1    Authorization - Number of Visits 9    PT Start Time 1100    PT Stop Time 1140    PT Time Calculation (min) 40 min    Activity Tolerance Patient tolerated treatment well    Behavior During Therapy [facility] for tasks assessed/performed              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 OR;  Service: Urology;  Laterality: Right;   CYSTOSCOPY/URETEROSCOPY/HOLMIUM LASER/STENT PLACEMENT Right 12/13/2020   Procedure: CYSTOSCOPY RIGHT URETEROSCOPY/HOLMIUM LASER/STENT EXTRACTION AND RIGHT JJ STENT PLACEMENT, RIGHT RETROGRADE URETEROSCOPY;  Surgeon: Matilda Senior, MD;  Location: [facility] ORS;  Service: Urology;  Laterality: Right;   URETER SURGERY     x2   WISDOM TOOTH EXTRACTION     Patient Active Problem List   Diagnosis Date Noted    Family history of pancreatic cancer 07/27/2024   POTS (postural orthostatic tachycardia syndrome) 05/17/2024   Placenta accreta 12/03/2022   History of cesarean section 10/01/2022   Essential hypertension 08/11/2022   Obesity (BMI 30.0-34.9) 08/11/2022   High risk multigravida in third trimester 09/10/2017   Hx of preeclampsia, prior pregnancy, currently pregnant 03/16/2014    PCP: Barnie Louder MD  REFERRING PROVIDER:   Lorilee Sven SQUIBB, MD    REFERRING DIAG: M79.7 (ICD-10-CM) - Fibromyalgia   Rationale for Evaluation and Treatment: Rehabilitation  THERAPY DIAG:  Other low back pain  Muscle weakness (generalized)  Unsteadiness on feet  Fibromyalgia  ONSET DATE: chronic  SUBJECTIVE:  SUBJECTIVE STATEMENT: I have been bleeding from my rectum going to see gastroenterologist. Pain today 5/10   Initial evaluation:  Fall in June hurt left ankle.  Having a hard time healing.  Ortho put me on a heavy boot for 4 months and it made my back hurt and made ankle worse.  I am in constant pain with my fibro.  Worst in my back.  Get trigger point injection in my back, a lot of joint pain. It feels hot.  Hard to explain. Have a 1 yr old/4 children. We are selling and buying a house. (External stress). Pain interrupts sleep >3 x night. Had abdominal surgery in Sept 2024 and have no core strength  PERTINENT HISTORY:  fibromyalgia  PAIN:  Are you having pain? Yes: NPRS scale: current 3/10 generalized; 6/10 shoulders Pain location: see above Pain description: tense, sharp and dull Aggravating factors: stress Relieving factors: meds; heat; TENs unit, pressure point mat  PRECAUTIONS: None  RED FLAGS: None   WEIGHT BEARING RESTRICTIONS: No  FALLS:  Has patient fallen in last 6 months? Yes 2:  fell down stairs; fell in shower  LIVING ENVIRONMENT: Lives with: lives with their family Lives in: House/apartment Stairs: No Has following equipment at home: None  OCCUPATION: stay at home Mom  PLOF: Independent  PATIENT GOALS: increase toleration to movement; get back to exercise  NEXT MD VISIT: intermittent  OBJECTIVE:  Note: Objective measures were completed at Evaluation unless otherwise noted.   PATIENT SURVEYS:  ODI:30/50=60% 12/9:  27/50=54%  COGNITION: Overall cognitive status: Within functional limits for tasks assessed       POSTURE: increased thoracic kyphosis/ hypermobile   LUMBAR ROM:   hypermobile  LOWER EXTREMITY ROM:     Active  Right eval Left eval  Hip flexion    Hip extension    Hip abduction    Hip adduction    Hip internal rotation    Hip external rotation    Knee flexion    Knee extension hyper hyper  Ankle dorsiflexion    Ankle plantarflexion    Ankle inversion    Ankle eversion     (Blank rows = not tested)  LOWER EXTREMITY MMT:     Tested in sitting HD lbs Right eval Left eval R / L 06/21/24  Hip flexion 29.1 21.9 40.0 / 40.7  Hip extension     Hip abduction 18.5 17.1 15.3 / 13.4  Hip adduction     Hip internal rotation     Hip external rotation     Knee flexion     Knee extension 12.8 22.0   Ankle dorsiflexion     Ankle plantarflexion     Ankle inversion     Ankle eversion      (Blank rows = not tested)   FUNCTIONAL TESTS:  Timed up and go (TUG): 14.17   4 stage balance:passed      12/8:  TUG 11.35 GAIT: Distance walked: 400 ft Assistive device utilized: None Level of assistance: Complete Independence Comments: antalgic upon initiation of gait  TREATMENT  [facility] Adult PT Treatment:                                             Date: 07/28/24  Pt seen for aquatic therapy today.  Treatment took place in water  3.5-4.75 ft in depth at the [facility] pool. Temp  of water  was 91.  Pt entered/exited  the pool via stairs independently with bil rail.  -walking forward, back and side stepping unsupported - decompression using noodle wrapped posteriorly-> between legs: cycling - prone suspension with noodle wrapped anteriorly across chest: -hip add/abd; knees to chest 2 x5 - with light resistance bells: marching forward with row motion ; walking forward with reciprocal arm swing; side stepping with arm add/abdct:  - Unsupported:  toe/heel raises x10; hip add/abd x10 ; hip flex/ext x10 - core engagement working sitting balance on yellow noodle with ue lifts   Pt requires the buoyancy and hydrostatic pressure of water  for support, and to offload joints by unweighting joint load by at least 50 % in navel deep water  and by at least 75-80% in chest to neck deep water .  Viscosity of the water  is needed for resistance of strengthening. Water  current perturbations provides challenge to standing balance requiring increased core activation.   PATIENT EDUCATION:  Education details: intro to aquatic therapy  Person educated: Patient Education method: Explanation Education comprehension: verbalized understanding  HOME EXERCISE PROGRAM: Aquatic tba  ASSESSMENT:  CLINICAL IMPRESSION: Pt arrives with slightly lower pain sensitivity. She is progressed through session with good toleration and reduced rest periods. Pt provided cuing throughout for pacing of activities and execution. Improved balance sitting on noodle demonstrated today. Goals ongoing     PN: functional and objective testing today completed with improvement in hip flex strength and TUG score as noted in charts above.  This indicates progression with transitional movements balance. Her ODI score has improved by 6% indicating an improvement in pt function. She reports she feels energized after sessions which last for about 4 days.  She  arrives with heart monitor donned.  She is unable to get submerged much > then waist deep.  Decided to  cancel next appt as the monitor with still be in use and pt requires deep water  for optimal benefit from aquatic session.       OBJECTIVE IMPAIRMENTS: decreased activity tolerance, decreased balance, decreased strength, and pain.   ACTIVITY LIMITATIONS: carrying, lifting, bending, sitting, standing, squatting, stairs, transfers, locomotion level, and caring for others  PARTICIPATION LIMITATIONS: meal prep, cleaning, shopping, community activity, occupation, and yard work  PERSONAL FACTORS: Age and Past/current experiences are also affecting patient's functional outcome.   REHAB POTENTIAL: Good  CLINICAL DECISION MAKING: Stable/uncomplicated  EVALUATION COMPLEXITY: Low   GOALS: Goals reviewed with patient? No  SHORT TERM GOALS: Target date: 10/30  Pt will tolerate full aquatic sessions consistently without increase in pain and with improving function to demonstrate good toleration and effectiveness of intervention.  Baseline: Goal status: In progress - 05/17/24;  Met 05/31/24  2.  Pt will report a reduction in pain while submerged by at least 50% to demonstrate using the properties of water  for pain management Baseline:  Goal status: in progress - 05/17/24; Met 05/31/24    LONG TERM GOALS: Target date: 08/19/24  Pt to improve on ODI by 13% to demonstrate statistically significant Improvement in function. (MCID 13-15%) Baseline: 30/50=60%; 54% Goal status: In progress 06/21/24  2.  Pt will report decrease in pain by at least 50% for improved toleration to activity/quality of life and to demonstrate improved management of pain. Baseline: see chart Goal status: In progress 05/31/24; 06/21/24  3.  Pt will improve strength in hips and knees within 5 lbs of contralateral side to demonstrate improved overall physical function Baseline: see chart Goal status: In progress 06/21/24  4.  Pt will report improved toleration to activity by 50% Baseline: see chart Goal status: In  progress 06/21/24  5.  Pt will consider gaining pool access for use of the properties of water  for chronic conditions maintaining mobility and minimizing pain. Baseline: none Goal status: In progress 05/31/24; 06/21/24; Met 07/12/24    PLAN:  PT FREQUENCY: 1-2x/week  PT DURATION: 8 weeks  PLANNED INTERVENTIONS: 97164- PT Re-evaluation, 97110-Therapeutic exercises, 97530- Therapeutic activity, 97112- Neuromuscular re-education, 97535- Self Care, [id number]- Manual therapy, [phone]- Gait training, Patient/Family education, Balance training, Stair training, Taping, Joint mobilization, and DME instructions.  PLAN FOR NEXT SESSION: aquatic: general strengthening (avoid excessive stretching); balance retraining; pain management  Ronal Foots) Exie Chrismer MPT 07/28/24 11:09 AM [facility] [address] [place], [place], [address] Phone: [phone]   Fax:  [phone]   For all possible CPT codes, reference the Planned Interventions line above.     Check all conditions that are expected to impact treatment: {Conditions expected to impact treatment:Musculoskeletal disorders   If treatment provided at initial evaluation, no treatment charged due to lack of authorization.      "

## 2024-08-01 ENCOUNTER — Ambulatory Visit (HOSPITAL_BASED_OUTPATIENT_CLINIC_OR_DEPARTMENT_OTHER): Payer: Self-pay | Admitting: Physical Therapy

## 2024-08-02 ENCOUNTER — Other Ambulatory Visit (HOSPITAL_COMMUNITY): Payer: Self-pay

## 2024-08-02 MED ORDER — SULFAMETHOXAZOLE-TRIMETHOPRIM 800-160 MG PO TABS
1.0000 | ORAL_TABLET | Freq: Two times a day (BID) | ORAL | 0 refills | Status: AC
  Filled 2024-08-02: qty 10, 5d supply, fill #0

## 2024-08-03 ENCOUNTER — Ambulatory Visit: Admitting: Gastroenterology

## 2024-08-03 ENCOUNTER — Encounter: Payer: Self-pay | Admitting: Gastroenterology

## 2024-08-03 ENCOUNTER — Encounter: Payer: Self-pay | Admitting: Physical Medicine and Rehabilitation

## 2024-08-03 ENCOUNTER — Other Ambulatory Visit (HOSPITAL_COMMUNITY): Payer: Self-pay

## 2024-08-03 VITALS — BP 110/70 | HR 92 | Ht 65.0 in | Wt 171.0 lb

## 2024-08-03 DIAGNOSIS — Z800 Family history of malignant neoplasm of digestive organs: Secondary | ICD-10-CM | POA: Diagnosis not present

## 2024-08-03 DIAGNOSIS — K5909 Other constipation: Secondary | ICD-10-CM

## 2024-08-03 DIAGNOSIS — R32 Unspecified urinary incontinence: Secondary | ICD-10-CM

## 2024-08-03 DIAGNOSIS — R300 Dysuria: Secondary | ICD-10-CM

## 2024-08-03 DIAGNOSIS — K625 Hemorrhage of anus and rectum: Secondary | ICD-10-CM | POA: Diagnosis not present

## 2024-08-03 DIAGNOSIS — K5900 Constipation, unspecified: Secondary | ICD-10-CM

## 2024-08-03 DIAGNOSIS — R319 Hematuria, unspecified: Secondary | ICD-10-CM

## 2024-08-03 MED ORDER — HYDROCORTISONE ACETATE 25 MG RE SUPP
25.0000 mg | Freq: Two times a day (BID) | RECTAL | 0 refills | Status: AC
  Filled 2024-08-03: qty 12, 6d supply, fill #0

## 2024-08-03 MED ORDER — NA SULFATE-K SULFATE-MG SULF 17.5-3.13-1.6 GM/177ML PO SOLN
1.0000 | ORAL | 0 refills | Status: DC
  Filled 2024-08-03: qty 354, 1d supply, fill #0

## 2024-08-03 NOTE — Progress Notes (Signed)
 "  Chief Complaint: rectal bleeding, constipation Primary GI FI:[id number]  HPI: Discussed the use of AI scribe software for clinical note transcription with the patient, who gave verbal consent to proceed.  History of Present Illness  Laurie Casey is a 39 year old female with chronic constipation and hemorrhoids who presents with two months of rectal bleeding.  Rectal bleeding has persisted for two months, characterized by fresh blood encasing the stool and on tissue paper, occurring with both bowel movements and passage of gas. Maroon-colored blood clots have also been observed. Bleeding has been constant since onset. She does not feel any external hemorrhoids on self-examination.  Constipation is longstanding, with frequent passage of small, hard stools and a sensation of incomplete evacuation. She often feels the urge to defecate but is unable to fully empty. Miralax and fiber supplements are not currently used. Occasionally, she elevates her knees with a step stool during defecation, but her thighs are usually parallel to the floor. Chronic nausea, frequent bloating described as feeling hugely pregnant all the time, and abdominal soreness, especially when full, are present. She denies palpable hemorrhoids but has previously experienced severe hemorrhoids.  Family history is notable for gastrointestinal and pancreatic malignancies, including a maternal grandmother with colon cancer in her seventies, two maternal aunts with pancreatic cancer, and her mother with pancreatic cancer diagnosed at age 26. A CT scan in October 2025 for kidney stones was unremarkable for pancreatic pathology. Recent laboratory workup showed no anemia or iron deficiency.  Urinary symptoms include pain with urination, urinary incontinence, and visible blood in the urine. She was recently evaluated by a urologist and prescribed antibiotics for presumed urinary tract infection, but has not yet started the  medication. History of kidney stones is noted.  Obstetric history includes four C-sections, with the last pregnancy complicated by placenta accreta involving the bladder, resulting in a large abdominal incision and removal of all reproductive organs except her ovaries. She reports no abdominal strength and ongoing pelvic floor weakness. She is on multiple medications for pain management related to fibromyalgia.   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   Pre-eclampsia    PTSD (post-traumatic stress disorder) 06/30/2022    Past Surgical History:  Procedure Laterality Date   CESAREAN SECTION  2013, 2015, 2019   x2   CESAREAN SECTION  03/24/2023   with partial historectomy with uterus, cervix and tube removal   CYSTOSCOPY  2008 or 2009   CYSTOSCOPY W/ URETERAL STENT PLACEMENT Right 11/29/2020   Procedure: CYSTOSCOPY WITH RETROGRADE PYELOGRAM/URETERAL STENT PLACEMENT;  Surgeon: Matilda Senior, MD;  Location: [facility] OR;  Service: Urology;  Laterality: Right;   CYSTOSCOPY/URETEROSCOPY/HOLMIUM LASER/STENT PLACEMENT Right 12/13/2020   Procedure: CYSTOSCOPY RIGHT URETEROSCOPY/HOLMIUM LASER/STENT EXTRACTION AND RIGHT JJ STENT PLACEMENT, RIGHT RETROGRADE URETEROSCOPY;  Surgeon: Matilda Senior, MD;  Location: [facility];  Service: Urology;  Laterality: Right;   URETER SURGERY     x2   WISDOM TOOTH EXTRACTION      Current Outpatient Medications  Medication Sig Dispense Refill   hydrocortisone  (ANUSOL -HC) 25 MG suppository Place 1 suppository (25 mg total) rectally 2 (two) times daily. 12 suppository 0   Na Sulfate-K Sulfate-Mg Sulfate concentrate (SUPREP)  17.5-3.13-1.6 GM/177ML SOLN Take 1 kit (354 mLs total) by mouth as  directed. 354 mL 0   amLODipine  (NORVASC ) 5 MG tablet Take 1 tablet (5 mg total) by mouth daily. 90 tablet 3   buPROPion (WELLBUTRIN SR) 200 MG 12 hr tablet Take 200 mg by mouth 2 (two) times daily.     DULoxetine (CYMBALTA) 30 MG capsule Take 30 mg by mouth 2 (two) times daily.     EPINEPHrine  0.3 mg/0.3 mL IJ SOAJ injection Inject 0.3 mg into the muscle as needed for anaphylaxis. (Patient not taking: Reported on 07/25/2024) 1 each 1   EPINEPHrine  0.3 mg/0.3 mL IJ SOAJ injection Inject 0.3 mg into the muscle as needed for anaphylaxis. 2 each 1   estradiol  (VIVELLE -DOT) 0.05 MG/24HR patch Place 1 patch (0.05 mg total) onto the skin 2 (two) times a week. (Patient not taking: Reported on 07/25/2024) 8 patch 12   estradiol  (VIVELLE -DOT) 0.05 MG/24HR patch Place 1 patch (0.05 mg total) onto the skin 2 (two) times a week. 8 patch 10   famotidine  (PEPCID ) 20 MG tablet Take 1 tablet (20 mg total) by mouth 2 (two) times daily as needed. 90 tablet 3   Multiple Vitamin (MULTIVITAMIN) tablet Take 1 tablet by mouth daily.     NALTREXONE HCL, PAIN, PO      prazosin  (MINIPRESS ) 1 MG capsule Take 1 capsule (1 mg total) by mouth at bedtime. 90 capsule 3   prazosin  (MINIPRESS ) 1 MG capsule Take 1 capsule (1 mg total) by mouth at bedtime. 90 capsule 3   pregabalin  (LYRICA ) 50 MG capsule Take 1 capsule (50 mg total) by mouth at bedtime. 90 capsule 3   propranolol  (INDERAL ) 10 MG tablet Take 1 tablet (10 mg total) by mouth daily. 90 tablet 3   rosuvastatin  (CRESTOR ) 5 MG tablet Take 1 tablet (5 mg total) by mouth daily. 90 tablet 3   sulfamethoxazole -trimethoprim  (BACTRIM  DS) 800-160 MG tablet Take 1 tablet by mouth 2 (two) times daily for 5 days. 10 tablet 0   tiZANidine  (ZANAFLEX ) 2 MG tablet Take 1 tablet (2 mg total) by mouth 2 (two) times daily as needed for muscle spasms. 30 tablet 3   topiramate  (TOPAMAX ) 25 MG tablet Take 1 tablet (25 mg total) by mouth 3 (three) times daily as needed. 270 tablet 2   XANAX  0.25  MG tablet Take 0.25 mg by mouth as needed.     No current facility-administered medications for this visit.   Facility-Administered Medications Ordered in Other Visits  Medication Dose Route Frequency Provider Last Rate Last Admin   sodium chloride  0.9 % bolus 250 mL  250 mL Intravenous Once Raulkar, Krutika P, MD        Allergies as of 08/03/2024 - Review Complete 07/28/2024  Allergen Reaction Noted   Tape Rash 12/12/2020    Family History  Problem Relation Age of Onset   Colon polyps Mother    Pancreatic cancer Mother        had whipple surgery 09/2023   Hypertension Father    Pancreatic cancer Maternal Aunt    Pancreatic cancer Maternal Aunt    Colon cancer Maternal Grandmother        in her 32's   Prostate cancer Paternal Grandfather     Social History   Socioeconomic History   Marital status: Significant Other    Spouse name: Not on file   Number of children: 3   Years of education: Not on file   Highest education level:  Associate degree: occupational, technical, or vocational program  Occupational History   Occupation: Vet tech  Tobacco Use   Smoking status: Former    Current packs/day: 0.00    Average packs/day: 0.3 packs/day    Types: Cigarettes    Quit date: 10/12/2020    Years since quitting: 3.8   Smokeless tobacco: Never   Tobacco comments:    off and on   Vaping Use   Vaping status: Never Used  Substance and Sexual Activity   Alcohol use: Not Currently   Drug use: Never   Sexual activity: Not on file  Other Topics Concern   Not on file  Social History Narrative   Not on file   Social Drivers of Health   Tobacco Use: Medium Risk (07/28/2024)   Patient History    Smoking Tobacco Use: Former    Smokeless Tobacco Use: Never    Passive Exposure: Not on file  Financial Resource Strain: Low Risk (06/05/2024)   Overall Financial Resource Strain (CARDIA)    Difficulty of Paying Living Expenses: Not very hard  Food Insecurity: No Food Insecurity  (06/05/2024)   Epic    Worried About Radiation Protection Practitioner of Food in the Last Year: Never true    Ran Out of Food in the Last Year: Never true  Transportation Needs: No Transportation Needs (06/05/2024)   Epic    Lack of Transportation (Medical): No    Lack of Transportation (Non-Medical): No  Physical Activity: Inactive (06/05/2024)   Exercise Vital Sign    Days of Exercise per Week: 0 days    Minutes of Exercise per Session: Not on file  Stress: Stress Concern Present (06/05/2024)   [employer] of Occupational Health - Occupational Stress Questionnaire    Feeling of Stress: Very much  Social Connections: Socially Isolated (06/05/2024)   Social Connection and Isolation Panel    Frequency of Communication with Friends and Family: Never    Frequency of Social Gatherings with Friends and Family: Never    Attends Religious Services: Never    Database Administrator or Organizations: No    Attends Engineer, Structural: Not on file    Marital Status: Divorced  Intimate Partner Violence: Not At Risk (07/23/2023)   Humiliation, Afraid, Rape, and Kick questionnaire    Fear of Current or Ex-Partner: No    Emotionally Abused: No    Physically Abused: No    Sexually Abused: No  Depression (PHQ2-9): Medium Risk (07/25/2024)   Depression (PHQ2-9)    PHQ-2 Score: 9  Alcohol Screen: Low Risk (07/23/2023)   Alcohol Screen    Last Alcohol Screening Score (AUDIT): 0  Housing: Low Risk (06/05/2024)   Epic    Unable to Pay for Housing in the Last Year: No    Number of Times Moved in the Last Year: 0    Homeless in the Last Year: No  Utilities: Not At Risk (07/23/2023)   [employer] Utilities    Threatened with loss of utilities: No  Health Literacy: Adequate Health Literacy (07/23/2023)   B1300 Health Literacy    Frequency of need for help with medical instructions: Never    Review of Systems:    Constitutional: No weight loss, fever, chills, weakness or fatigue HEENT: Eyes: No change in vision                Ears, Nose, Throat:  No change in hearing or congestion Skin: No rash or itching Cardiovascular: No chest pain, chest pressure or palpitations  Respiratory: No SOB or cough Gastrointestinal: See HPI and otherwise negative Genitourinary: No dysuria or change in urinary frequency Neurological: No headache, dizziness or syncope Musculoskeletal: No new muscle or joint pain Hematologic: No bleeding or bruising Psychiatric: No history of depression or anxiety    Physical Exam:  Vital signs: BP 110/70   Pulse 92   Ht 5' 5 (1.651 m)   Wt 171 lb (77.6 kg)   LMP 07/19/2022 Comment: Had baby in September  BMI 28.46 kg/m   Constitutional: NAD, alert and cooperative Head:  Normocephalic and atraumatic. Eyes:   PEERL, EOMI. No icterus. Conjunctiva pink. Respiratory: Respirations even and unlabored. Lungs clear to auscultation bilaterally.   No wheezes, crackles, or rhonchi.  Cardiovascular:  Regular rate and rhythm. No peripheral edema, cyanosis or pallor.  Gastrointestinal:  Soft, nondistended, nontender. No rebound or guarding. Normal bowel sounds. No appreciable masses or hepatomegaly. Rectal:  Declines Msk:  Symmetrical without gross deformities. Without edema, no deformity or joint abnormality.  Neurologic:  Alert and  oriented x4;  grossly normal neurologically.  Skin:   Dry and intact without significant lesions or rashes. Psychiatric: Oriented to person, place and time. Demonstrates good judgement and reason without abnormal affect or behaviors.  Physical Exam    RELEVANT LABS AND IMAGING: CBC    Component Value Date/Time   WBC 10.8 07/25/2024 1220   WBC 10.3 04/18/2024 0727   RBC 4.35 07/25/2024 1220   RBC 4.13 04/18/2024 0727   HGB 13.6 07/25/2024 1220   HCT 41.8 07/25/2024 1220   PLT 230 07/25/2024 1220   MCV 96 07/25/2024 1220   MCH 31.3 07/25/2024 1220   MCH 31.0 04/18/2024 0727   MCHC 32.5 07/25/2024 1220   MCHC 33.1 04/18/2024 0727   RDW 11.8  07/25/2024 1220   LYMPHSABS 2.1 04/18/2024 0727   LYMPHSABS 2.4 10/01/2022 1004   MONOABS 1.1 (H) 04/18/2024 0727   EOSABS 0.4 04/18/2024 0727   EOSABS 0.3 10/01/2022 1004   BASOSABS 0.1 04/18/2024 0727   BASOSABS 0.1 10/01/2022 1004    CMP     Component Value Date/Time   NA 140 04/18/2024 0727   NA 141 03/22/2024 1225   K 3.9 04/18/2024 0727   CL 109 04/18/2024 0727   CO2 21 (L) 04/18/2024 0727   GLUCOSE 110 (H) 04/18/2024 0727   BUN 14 04/18/2024 0727   BUN 13 03/22/2024 1225   CREATININE 1.14 (H) 04/18/2024 0727   CALCIUM  9.5 04/18/2024 0727   PROT 6.3 (L) 04/18/2024 0727   PROT 6.8 03/22/2024 1225   ALBUMIN 3.9 04/18/2024 0727   ALBUMIN 4.4 03/22/2024 1225   AST 14 (L) 04/18/2024 0727   ALT 6 04/18/2024 0727   ALKPHOS 99 04/18/2024 0727   BILITOT <0.2 04/18/2024 0727   BILITOT 0.3 03/22/2024 1225   GFRNONAA >60 04/18/2024 0727     Assessment/Plan:   Rectal bleeding  Chronic constipation Chronic rectal bleeding with maroon clots likely from internal hemorrhoids, exacerbated by chronic constipation. Maternal grandmother with colon cancer in her 47s. Suspect pelvic floor dysfunction component with addition of urinary symptoms leading to worsening constipation and hemorrhoids. No anemia or iron deficiency. - Initiated fiber supplementation (Benefiber) for one week. - Advised addition of polyethylene glycol (Miralax) if fiber alone is insufficient. - hydrocortisone  suppositories BID 14 days - rectal exam deferred to colonoscopy - Provided education on use of a squatty potty to optimize defecation posture. - pelvic floor PT - colonoscopy for further evaluation - 2-day prep - I  thoroughly discussed the procedure with the patient (at bedside) to include nature of the procedure, alternatives, benefits, and risks (including but not limited to bleeding, infection, perforation, anesthesia/cardiac pulmonary complications).  Patient verbalized understanding and gave verbal  consent to proceed with procedure.   Pelvic floor dysfunction Urinary incontinence, hesitancy, and incomplete evacuation suggest pelvic floor dysfunction, likely due to prior pregnancies, surgeries, and decreased abdominal strength. Contributes to bowel and urinary symptoms. - Referred to pelvic floor physical therapy for evaluation and management. - continue to follow with urology  Family history of pancreatic cancer Maternal aunts with pancreatic cancer and mother recently s/p whipple in her 4s due to pancreatic cancer -- scheduled to see genetic counselor Feb 2nd. Recent CT renal stone unrevealing.  Hematuria Dysuria Currently on antibiotic via urology and following regularly  Assign to Dr. Stacia based on procedure availability  Nestor Mollie DEVONNA [facility] 08/03/2024, 10:27 AM  Cc: Vicci Barnie NOVAK, MD "

## 2024-08-03 NOTE — Patient Instructions (Addendum)
 A high fiber diet with plenty of fluids (up to 8 glasses of water  daily) is suggested to relieve these symptoms.  Benefiber, 1 tablespoon once or twice daily can be used to keep bowels regular if needed.   Start taking Miralax 1 capful (17 grams) 1x / day for 1 week.   If this is not effective, increase to 1 dose 2x / day for 1 week.   If this is still not effective, increase to two capfuls (34 grams) 2x / day.   Can adjust dose as needed based on response. Can take 1/2 cap daily, skip days, or increase per day.    You have been scheduled for a colonoscopy. Please follow written instructions given to you at your visit today.   If you use inhalers (even only as needed), please bring them with you on the day of your procedure.  DO NOT TAKE 7 DAYS PRIOR TO TEST- Trulicity (dulaglutide) Ozempic, Wegovy (semaglutide) Mounjaro, Zepbound (tirzepatide) Bydureon Bcise (exanatide extended release)  DO NOT TAKE 1 DAY PRIOR TO YOUR TEST Rybelsus (semaglutide) Adlyxin (lixisenatide) Victoza (liraglutide) Byetta (exanatide) ___________________________________________________________________________  We have placed a referral for pelvic floor PT. They will contact you to set up the appointments.   I appreciate the opportunity to care for you. Nestor Blower, PA

## 2024-08-06 NOTE — Progress Notes (Signed)
 Agree with the assessment and plan as outlined by Boone Master, PA-C.

## 2024-08-09 ENCOUNTER — Ambulatory Visit (HOSPITAL_BASED_OUTPATIENT_CLINIC_OR_DEPARTMENT_OTHER): Admitting: Physical Therapy

## 2024-08-10 ENCOUNTER — Encounter: Payer: Self-pay | Admitting: Gastroenterology

## 2024-08-10 ENCOUNTER — Ambulatory Visit: Admitting: Gastroenterology

## 2024-08-10 VITALS — BP 120/78 | HR 74 | Temp 97.7°F | Resp 13 | Ht 65.0 in | Wt 171.0 lb

## 2024-08-10 DIAGNOSIS — K5140 Inflammatory polyps of colon without complications: Secondary | ICD-10-CM | POA: Diagnosis not present

## 2024-08-10 DIAGNOSIS — D121 Benign neoplasm of appendix: Secondary | ICD-10-CM

## 2024-08-10 DIAGNOSIS — D123 Benign neoplasm of transverse colon: Secondary | ICD-10-CM | POA: Diagnosis not present

## 2024-08-10 DIAGNOSIS — D122 Benign neoplasm of ascending colon: Secondary | ICD-10-CM | POA: Diagnosis not present

## 2024-08-10 DIAGNOSIS — K621 Rectal polyp: Secondary | ICD-10-CM

## 2024-08-10 DIAGNOSIS — D125 Benign neoplasm of sigmoid colon: Secondary | ICD-10-CM

## 2024-08-10 DIAGNOSIS — D120 Benign neoplasm of cecum: Secondary | ICD-10-CM | POA: Diagnosis not present

## 2024-08-10 DIAGNOSIS — D128 Benign neoplasm of rectum: Secondary | ICD-10-CM

## 2024-08-10 DIAGNOSIS — K625 Hemorrhage of anus and rectum: Secondary | ICD-10-CM | POA: Diagnosis not present

## 2024-08-10 DIAGNOSIS — D124 Benign neoplasm of descending colon: Secondary | ICD-10-CM | POA: Diagnosis not present

## 2024-08-10 MED ORDER — SODIUM CHLORIDE 0.9 % IV SOLN: 500.0000 mL | Freq: Once | INTRAVENOUS | Status: DC

## 2024-08-10 NOTE — Op Note (Signed)
 [facility] Patient Name: Laurie Casey Procedure Date: 08/10/2024 4:17 PM MRN: [id number] Endoscopist: Glendia E. Stacia , MD, [id number] Age: 39 Referring MD:  Date of Birth: [birth year] Gender: Female Account #: [id number] Procedure:                Colonoscopy Indications:              Hematochezia Medicines:                Monitored Anesthesia Care Procedure:                Pre-Anesthesia Assessment:                           - Prior to the procedure, a History and Physical                            was performed, and patient medications and                            allergies were reviewed. The patient's tolerance of                            previous anesthesia was also reviewed. The risks                            and benefits of the procedure and the sedation                            options and risks were discussed with the patient.                            All questions were answered, and informed consent                            was obtained. Prior Anticoagulants: The patient has                            taken no anticoagulant or antiplatelet agents. ASA                            Grade Assessment: II - A patient with mild systemic                            disease. After reviewing the risks and benefits,                            the patient was deemed in satisfactory condition to                            undergo the procedure.                           After obtaining informed consent, the colonoscope  was passed under direct vision. Throughout the                            procedure, the patient's blood pressure, pulse, and                            oxygen saturations were monitored continuously. The                            CF [id number] #[id number] was introduced through the anus                            and advanced to the the terminal ileum, with                            identification of the appendiceal orifice  and IC                            valve. The colonoscopy was performed without                            difficulty. The patient tolerated the procedure                            well. The quality of the bowel preparation was                            adequate. The terminal ileum, ileocecal valve,                            appendiceal orifice, and rectum were photographed.                            The bowel preparation used was SUPREP via split                            dose instruction. Scope In: 4:29:39 PM Scope Out: 5:12:50 PM Scope Withdrawal Time: 0 hours 35 minutes 48 seconds  Total Procedure Duration: 0 hours 43 minutes 11 seconds  Findings:                 The perianal and digital rectal examinations were                            normal. Pertinent negatives include normal                            sphincter tone and no palpable rectal lesions.                           Multiple (hundreds) sessile polyps were found in                            the entire colon. The polyps were 1 to 25 mm in  size. The vast majority of the polyps were <5 mm.                            No segments of the colon were unaffected. Twenty                            two of these polyps were removed with a cold snare.                            Resection and retrieval were complete. Estimated                            blood loss was minimal.                           A 12 mm polyp was found in the distal rectum. The                            polyp was pedunculated. The polyp was removed with                            a hot snare. Resection and retrieval were complete.                            Estimated blood loss: none. Biopsies were taken of                            the polypectomy site with a cold forceps for                            histology. Estimated blood loss was minimal.                           The exam was otherwise normal throughout the                             examined colon.                           The exam was otherwise normal throughout the                            examined colon.                           The terminal ileum appeared normal.                           The retroflexed view of the distal rectum and anal                            verge was normal and showed no anal or rectal  abnormalities. Complications:            No immediate complications. Estimated Blood Loss:     Estimated blood loss was minimal. Impression:               - Too numerous to count polyps throughout the                            entire colon, the largest ones were removed with a                            cold snare. Resected and retrieved.                           - One 12 mm polyp in the distal rectum, removed                            with a hot snare. Resected and retrieved. Biopsied.                           - The examined portion of the ileum was normal.                           - The distal rectum and anal verge are normal on                            retroflexion view.                           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 Recommendation:           - Patient has a contact number available for                            emergencies. The signs and symptoms of potential                            delayed complications were discussed with the                            patient. Return to normal activities tomorrow.                            Written discharge instructions were provided to the                            patient.                           - Resume previous diet.                           - Continue present medications.                           -  Await pathology results.                           - Proceed with genetic testing.                           - Recommend discussing prophylactic colectomy with                             colorectal surgery.                           - Repeat colonoscopy in 9-12 months year for                            surveillance if colectomy not pursued.                           - Recommend scheduling for upper endoscopy to                            assess gastric polyp burden/ampullary assessment. Massey Ruhland E. Stacia, MD 08/10/2024 5:29:31 PM This report has been signed electronically.

## 2024-08-10 NOTE — Progress Notes (Unsigned)
 Pt's states no medical or surgical changes since previsit or office visit.

## 2024-08-10 NOTE — Patient Instructions (Addendum)
-  Handout on polyps provided. -await pathology results. -repeat colonoscopy in 9-12 month for surveillance recommended.  -Continue present medications. -proceed with genetic testing -Recommend discussing prophylactic colectomy with colorectal surgeon. -Scheduling for an upper endoscopy to assess gastric polyp burden on 08/17/24 at 0730  YOU HAD AN ENDOSCOPIC PROCEDURE TODAY AT THE [facility]:   Refer to the procedure report that was given to you for any specific questions about what was found during the examination.  If the procedure report does not answer your questions, please call your gastroenterologist to clarify.  If you requested that your care partner not be given the details of your procedure findings, then the procedure report has been included in a sealed envelope for you to review at your convenience later.  YOU SHOULD EXPECT: Some feelings of bloating in the abdomen. Passage of more gas than usual.  Walking can help get rid of the air that was put into your GI tract during the procedure and reduce the bloating. If you had a lower endoscopy (such as a colonoscopy or flexible sigmoidoscopy) you may notice spotting of blood in your stool or on the toilet paper. If you underwent a bowel prep for your procedure, you may not have a normal bowel movement for a few days.  Please Note:  You might notice some irritation and congestion in your nose or some drainage.  This is from the oxygen used during your procedure.  There is no need for concern and it should clear up in a day or so.  SYMPTOMS TO REPORT IMMEDIATELY:  Following lower endoscopy (colonoscopy or flexible sigmoidoscopy):  Excessive amounts of blood in the stool  Significant tenderness or worsening of abdominal pains  Swelling of the abdomen that is new, acute  Fever of 100F or higher   For urgent or emergent issues, a gastroenterologist can be reached at any hour by calling [phone]. Do not use MyChart  messaging for urgent concerns.    DIET:  We do recommend a small meal at first, but then you may proceed to your regular diet.  Drink plenty of fluids but you should avoid alcoholic beverages for 24 hours.  ACTIVITY:  You should plan to take it easy for the rest of today and you should NOT DRIVE or use heavy machinery until tomorrow (because of the sedation medicines used during the test).    FOLLOW UP: Our staff will call the number listed on your records the next business day following your procedure.  We will call around 7:15- 8:00 am to check on you and address any questions or concerns that you may have regarding the information given to you following your procedure. If we do not reach you, we will leave a message.     If any biopsies were taken you will be contacted by phone or by letter within the next 1-3 weeks.  Please call us  at [phone] if you have not heard about the biopsies in 3 weeks.    SIGNATURES/CONFIDENTIALITY: You and/or your care partner have signed paperwork which will be entered into your electronic medical record.  These signatures attest to the fact that that the information above on your After Visit Summary has been reviewed and is understood.  Full responsibility of the confidentiality of this discharge information lies with you and/or your care-partner.

## 2024-08-10 NOTE — Progress Notes (Unsigned)
 Transferred to PACU via stretcher.  Not responding to stimulation at this time.  VSS upon leaving procedure room.

## 2024-08-10 NOTE — Progress Notes (Unsigned)
 History and Physical Interval Note:  08/10/2024 4:19 PM  Laurie Casey  has presented today for endoscopic procedure(s), with the diagnosis of  Encounter Diagnosis  Name Primary?   Rectal bleeding Yes  .  The various methods of evaluation and treatment have been discussed with the patient and/or family. After consideration of risks, benefits and other options for treatment, the patient has consented to  the endoscopic procedure(s).   The patient's history has been reviewed, patient examined, no change in status, stable for endoscopic procedure(s).  I have reviewed the patient's chart and labs.  Questions were answered to the patient's satisfaction.     Rudolph Daoust E. Stacia, MD [facility]

## 2024-08-10 NOTE — Progress Notes (Unsigned)
 Called to room to assist during endoscopic procedure.  Patient ID and intended procedure confirmed with present staff. Received instructions for my participation in the procedure from the performing physician.

## 2024-08-11 ENCOUNTER — Other Ambulatory Visit: Payer: Self-pay

## 2024-08-11 ENCOUNTER — Telehealth: Payer: Self-pay

## 2024-08-11 ENCOUNTER — Encounter: Payer: Self-pay | Admitting: Genetic Counselor

## 2024-08-11 DIAGNOSIS — K635 Polyp of colon: Secondary | ICD-10-CM

## 2024-08-11 NOTE — Telephone Encounter (Signed)
 No answer after follow up call. Voice message left.

## 2024-08-15 ENCOUNTER — Inpatient Hospital Stay: Admitting: Genetic Counselor

## 2024-08-15 ENCOUNTER — Inpatient Hospital Stay

## 2024-08-15 ENCOUNTER — Telehealth: Payer: Self-pay | Admitting: Genetic Counselor

## 2024-08-15 ENCOUNTER — Encounter: Payer: Self-pay | Admitting: Cardiology

## 2024-08-15 ENCOUNTER — Other Ambulatory Visit: Payer: Self-pay

## 2024-08-15 ENCOUNTER — Other Ambulatory Visit (HOSPITAL_BASED_OUTPATIENT_CLINIC_OR_DEPARTMENT_OTHER): Payer: Self-pay

## 2024-08-16 ENCOUNTER — Encounter (HOSPITAL_BASED_OUTPATIENT_CLINIC_OR_DEPARTMENT_OTHER): Payer: Self-pay | Admitting: Physical Therapy

## 2024-08-16 ENCOUNTER — Ambulatory Visit (HOSPITAL_BASED_OUTPATIENT_CLINIC_OR_DEPARTMENT_OTHER): Payer: Self-pay | Attending: Physical Medicine and Rehabilitation | Admitting: Physical Therapy

## 2024-08-16 ENCOUNTER — Ambulatory Visit: Payer: Self-pay | Admitting: Gastroenterology

## 2024-08-16 DIAGNOSIS — M5459 Other low back pain: Secondary | ICD-10-CM

## 2024-08-16 DIAGNOSIS — M6281 Muscle weakness (generalized): Secondary | ICD-10-CM

## 2024-08-16 DIAGNOSIS — R2681 Unsteadiness on feet: Secondary | ICD-10-CM

## 2024-08-16 LAB — SURGICAL PATHOLOGY

## 2024-08-16 NOTE — Progress Notes (Signed)
 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  Tubular adenomas are considered precancerous polyps, meaning that they have the potential to turn into cancer if they are not removed.  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   Please complete the genetic testing and counseling as scheduled and proceed with the upper endoscopy as scheduled.    I would recommend you meet with a colorectal surgeon to discuss a prophylactic colectomy given your very high risk of colon cancer.  Tentatively plan to repeat colonoscopy for further resection of polyps in 9-12 months.

## 2024-08-17 ENCOUNTER — Inpatient Hospital Stay: Admitting: Genetic Counselor

## 2024-08-17 ENCOUNTER — Telehealth: Payer: Self-pay | Admitting: Genetic Counselor

## 2024-08-17 ENCOUNTER — Ambulatory Visit: Admitting: Gastroenterology

## 2024-08-17 ENCOUNTER — Encounter: Payer: Self-pay | Admitting: Gastroenterology

## 2024-08-17 ENCOUNTER — Telehealth: Payer: Self-pay | Admitting: Internal Medicine

## 2024-08-17 VITALS — BP 111/71 | HR 78 | Temp 97.0°F | Resp 11 | Ht 65.0 in | Wt 171.0 lb

## 2024-08-17 DIAGNOSIS — D126 Benign neoplasm of colon, unspecified: Secondary | ICD-10-CM

## 2024-08-17 DIAGNOSIS — K317 Polyp of stomach and duodenum: Secondary | ICD-10-CM

## 2024-08-17 MED ORDER — SODIUM CHLORIDE 0.9 % IV SOLN: 500.0000 mL | Freq: Once | INTRAVENOUS | Status: DC

## 2024-08-17 NOTE — Progress Notes (Signed)
 Called to room to assist during endoscopic procedure.  Patient ID and intended procedure confirmed with present staff. Received instructions for my participation in the procedure from the performing physician.

## 2024-08-17 NOTE — Telephone Encounter (Signed)
 Calling for 2 pm telehealth genetic counseling appointment. No answer x2. LVM with call back instructions - if interested in completing today, will need call back by 3 pm. Otherwise can reschedule.

## 2024-08-17 NOTE — Progress Notes (Signed)
 Sedate, gd SR, tolerated procedure well, VSS, report to RN

## 2024-08-17 NOTE — Op Note (Signed)
 [facility] Patient Name: Laurie Casey Procedure Date: 08/17/2024 8:42 AM MRN: [id number] Endoscopist: Glendia E. Stacia , MD, [id number] Age: 39 Referring MD:  Date of Birth: 01-18-1986 Gender: Female Account #: [id number] Procedure:                Upper GI endoscopy Indications:              Surveillance for malignancy due to personal history                            of polyposis syndrome (genetic testing pending, but                            mother with reported polyposis syndrome and patient                            with recent colonoscopy with hundreds of adenomas) Medicines:                Monitored Anesthesia Care Procedure:                Pre-Anesthesia Assessment:                           - Prior to the procedure, a History and Physical                            was performed, and patient medications and                            allergies were reviewed. The patient's tolerance of                            previous anesthesia was also reviewed. The risks                            and benefits of the procedure and the sedation                            options and risks were discussed with the patient.                            All questions were answered, and informed consent                            was obtained. Prior Anticoagulants: The patient has                            taken no anticoagulant or antiplatelet agents. ASA                            Grade Assessment: II - A patient with mild systemic                            disease. After reviewing the risks and benefits,  the patient was deemed in satisfactory condition to                            undergo the procedure.                           After obtaining informed consent, the endoscope was                            passed under direct vision. Throughout the                            procedure, the patient's blood pressure, pulse, and                             oxygen saturations were monitored continuously. The                            Olympus scope [id number] was introduced through the                            mouth, and advanced to the second part of duodenum.                            The upper GI endoscopy was technically difficult                            and complex due to presence of food. The patient                            tolerated the procedure well. Scope In: Scope Out: Findings:                 The examined portions of the nasopharynx,                            oropharynx and larynx were normal.                           The examined esophagus was normal.                           Multiple small sessile polyps were found in the                            gastric fundus and in the gastric body. None of                            these polyps were greater than 1 cm, but there were                            some areas of confluency. Multiple biopsies were  taken with a cold forceps for histology. Estimated                            blood loss was minimal.                           A small amount of food (residue) was found in the                            gastric body.                           The exam of the stomach was otherwise normal.                           Localized mucosal changes characterized by pink                            discoloration and friability (with contact                            bleeding) and loss of villi were found in the                            second portion of the duodenum, involving several                            centimeters in the second portion of the duodenum.                            Biopsies were taken with a cold forceps for                            histology. Estimated blood loss was minimal.                           Food (residue) was found in the second portion of                            the duodenum.                            Several (5-10) 3 to 8 mm flat polyps vs focal                            inflammatory changes with no bleeding were found in                            the second portion of the duodenum. Biopsies were                            taken with a cold forceps for histology. Estimated  blood loss was minimal.                           The duodenal bulb was normal. Complications:            No immediate complications. Estimated Blood Loss:     Estimated blood loss was minimal. Impression:               - The examined portions of the nasopharynx,                            oropharynx and larynx were normal.                           - Normal esophagus.                           - Multiple gastric polyps. Biopsied. 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                           - A small amount of food (residue) in the stomach.                           - Mucosal changes in the duodenum. Biopsied.                            Unclear if these are inflammatory vs. neoplastic,                            but given underlying polyposis, concern for                            neoplasm is high. Visualization limited by food                            residue. Ampulla not visualized.                           - Retained food in the duodenum which interfered                            with visualization                           - A few duodenal flat polyps vs focal inflammatory                            changes. Biopsied.                           - Normal duodenal bulb. Recommendation:           - Patient has a contact number available for  emergencies. The signs and symptoms of potential                            delayed complications were discussed with the                            patient. Return to normal activities tomorrow.                            Written discharge instructions were provided to  the                            patient.                           - Resume previous diet.                           - Continue present medications.                           - Await pathology results.                           - Further recommendations will be based on                            pathology results, but a repeat endoscopy with a                            side viewing scope will be necessary regardless                            given nonvisualization of ampulla and poor                            visibility due to retained food. Sherria Riemann E. Stacia, MD 08/17/2024 9:32:55 AM This report has been signed electronically.

## 2024-08-17 NOTE — Telephone Encounter (Signed)
 Courteous Notification of No Show for Genetic Counseling Referral  08/11/24 patient sent message due to inclement weather predicted to change to Telephone Visit 08/15/2024- patient agreed; Patient Did Not Answer on 2/2; Genetic Counselor had a cancellation  that called in on 2/3 for there 2/4 2pm & she called this patient to reschedule in that slot- patient agreed and then No Showed - Genetic Counselor sent patient another EPIC message regarding 2/4 2pm appt and to call back to reschedule   Thank you

## 2024-08-17 NOTE — Progress Notes (Signed)
 Pt's states no medical or surgical changes since previsit or office visit.

## 2024-08-17 NOTE — Patient Instructions (Signed)
 Resume previous diet and medications. Awaiting pathology results. Further recommendations based on pending pathology results, but a repeat endoscopy with side view will be necessary regardless given nonvisualization of ampulla and poor visibility due to retained food.  YOU HAD AN ENDOSCOPIC PROCEDURE TODAY AT THE [facility]:   Refer to the procedure report that was given to you for any specific questions about what was found during the examination.  If the procedure report does not answer your questions, please call your gastroenterologist to clarify.  If you requested that your care partner not be given the details of your procedure findings, then the procedure report has been included in a sealed envelope for you to review at your convenience later.  YOU SHOULD EXPECT: Some feelings of bloating in the abdomen. Passage of more gas than usual.  Walking can help get rid of the air that was put into your GI tract during the procedure and reduce the bloating. If you had a lower endoscopy (such as a colonoscopy or flexible sigmoidoscopy) you may notice spotting of blood in your stool or on the toilet paper. If you underwent a bowel prep for your procedure, you may not have a normal bowel movement for a few days.  Please Note:  You might notice some irritation and congestion in your nose or some drainage.  This is from the oxygen used during your procedure.  There is no need for concern and it should clear up in a day or so.  SYMPTOMS TO REPORT IMMEDIATELY:  Following upper endoscopy (EGD)  Vomiting of blood or coffee ground material  New chest pain or pain under the shoulder blades  Painful or persistently difficult swallowing  New shortness of breath  Fever of 100F or higher  Black, tarry-looking stools  For urgent or emergent issues, a gastroenterologist can be reached at any hour by calling [phone]. Do not use MyChart messaging for urgent concerns.    DIET:  We do recommend  a small meal at first, but then you may proceed to your regular diet.  Drink plenty of fluids but you should avoid alcoholic beverages for 24 hours.  ACTIVITY:  You should plan to take it easy for the rest of today and you should NOT DRIVE or use heavy machinery until tomorrow (because of the sedation medicines used during the test).    FOLLOW UP: Our staff will call the number listed on your records the next business day following your procedure.  We will call around 7:15- 8:00 am to check on you and address any questions or concerns that you may have regarding the information given to you following your procedure. If we do not reach you, we will leave a message.     If any biopsies were taken you will be contacted by phone or by letter within the next 1-3 weeks.  Please call us  at [phone] if you have not heard about the biopsies in 3 weeks.    SIGNATURES/CONFIDENTIALITY: You and/or your care partner have signed paperwork which will be entered into your electronic medical record.  These signatures attest to the fact that that the information above on your After Visit Summary has been reviewed and is understood.  Full responsibility of the confidentiality of this discharge information lies with you and/or your care-partner.

## 2024-08-17 NOTE — Progress Notes (Signed)
 [facility] History and Physical   Primary Care Physician:  Vicci Barnie NOVAK, MD   Reason for Procedure:   Polyposis syndrome  Plan:    EGD  The patient was provided an opportunity to ask questions and all were answered. The patient agreed with the plan    HPI: Laurie Casey is a 39 y.o. female undergoing EGD after a recent colonoscopy showed hundreds of adenomatous polyps.  She has a family history of colon cancer and pancreatic cancer.  Genetic testing is pending.  EGD to assess for upper GI tract polyps and to assess ampulla if possible.   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   Pre-eclampsia    PTSD (post-traumatic stress disorder) 06/30/2022    Past Surgical History:  Procedure Laterality Date   CESAREAN SECTION  2013, 2015, 2019   x2   CESAREAN SECTION  03/24/2023   with partial historectomy with uterus, cervix and tube removal   CYSTOSCOPY  2008 or 2009   CYSTOSCOPY W/ URETERAL STENT PLACEMENT Right 11/29/2020   Procedure: CYSTOSCOPY WITH RETROGRADE PYELOGRAM/URETERAL STENT PLACEMENT;  Surgeon: Matilda Senior, MD;  Location: [facility] OR;  Service: Urology;  Laterality: Right;   CYSTOSCOPY/URETEROSCOPY/HOLMIUM LASER/STENT PLACEMENT Right 12/13/2020   Procedure: CYSTOSCOPY RIGHT URETEROSCOPY/HOLMIUM LASER/STENT EXTRACTION AND RIGHT JJ STENT PLACEMENT, RIGHT RETROGRADE URETEROSCOPY;  Surgeon: Matilda Senior, MD;  Location: [facility] ORS;  Service: Urology;  Laterality: Right;   URETER SURGERY     x2   WISDOM TOOTH EXTRACTION      Prior to Admission medications  Medication Sig Start Date End Date Taking? Authorizing Provider  amLODipine  (NORVASC ) 5 MG tablet Take 1 tablet (5 mg  total) by mouth daily. 06/16/24  Yes Tobb, Kardie, DO  buPROPion (WELLBUTRIN SR) 200 MG 12 hr tablet Take 200 mg by mouth 2 (two) times daily.   Yes [provider]  DULoxetine (CYMBALTA) 30 MG capsule Take 30 mg by mouth 2 (two) times daily.   Yes [provider]  estradiol  (VIVELLE -DOT) 0.05 MG/24HR patch Place 1 patch (0.05 mg total) onto the skin 2 (two) times a week. 06/30/24  Yes Stinson, Jacob J, DO  famotidine  (PEPCID ) 20 MG tablet Take 1 tablet (20 mg total) by mouth 2 (two) times daily as needed. 04/20/24  Yes Raulkar, Sven SQUIBB, MD  Multiple Vitamin (MULTIVITAMIN) tablet Take 1 tablet by mouth daily.   Yes [provider]  prazosin  (MINIPRESS ) 1 MG capsule Take 1 capsule (1 mg total) by mouth at bedtime. 02/15/24  Yes Raulkar, Sven SQUIBB, MD  pregabalin  (LYRICA ) 50 MG capsule Take 1 capsule (50 mg total) by mouth at bedtime. 07/17/24  Yes Raulkar, Sven SQUIBB, MD  propranolol  (INDERAL ) 10 MG tablet Take 1 tablet (10 mg total) by mouth daily. 05/17/24  Yes Raulkar, Sven SQUIBB, MD  rosuvastatin  (CRESTOR ) 5 MG tablet Take 1 tablet (5 mg total) by mouth daily. 07/08/24  Yes Tobb, Kardie, DO  tiZANidine  (ZANAFLEX ) 2 MG tablet Take 1 tablet (2 mg total) by mouth 2 (two) times daily as needed for muscle spasms. 02/26/24  Yes Vicci Barnie NOVAK, MD  topiramate  (TOPAMAX ) 25 MG tablet Take 1 tablet (25 mg total) by mouth 3 (  three) times daily as needed. 03/09/24  Yes Raulkar, Sven SQUIBB, MD  EPINEPHrine  0.3 mg/0.3 mL IJ SOAJ injection Inject 0.3 mg into the muscle as needed for anaphylaxis. Patient not taking: Reported on 08/17/2024 11/20/23   Vicci Barnie NOVAK, MD  hydrocortisone  (ANUSOL -HC) 25 MG suppository Place 1 suppository (25 mg total) rectally 2 (two) times daily. Patient not taking: Reported on 08/17/2024 08/03/24   Mollie Nestor HERO, PA-C  XANAX  0.25 MG tablet Take 0.25 mg by mouth as needed. 07/15/23   [provider]    Current Outpatient Medications  Medication Sig  Dispense Refill   amLODipine  (NORVASC ) 5 MG tablet Take 1 tablet (5 mg total) by mouth daily. 90 tablet 3   buPROPion (WELLBUTRIN SR) 200 MG 12 hr tablet Take 200 mg by mouth 2 (two) times daily.     DULoxetine (CYMBALTA) 30 MG capsule Take 30 mg by mouth 2 (two) times daily.     estradiol  (VIVELLE -DOT) 0.05 MG/24HR patch Place 1 patch (0.05 mg total) onto the skin 2 (two) times a week. 8 patch 10   famotidine  (PEPCID ) 20 MG tablet Take 1 tablet (20 mg total) by mouth 2 (two) times daily as needed. 90 tablet 3   Multiple Vitamin (MULTIVITAMIN) tablet Take 1 tablet by mouth daily.     prazosin  (MINIPRESS ) 1 MG capsule Take 1 capsule (1 mg total) by mouth at bedtime. 90 capsule 3   pregabalin  (LYRICA ) 50 MG capsule Take 1 capsule (50 mg total) by mouth at bedtime. 90 capsule 3   propranolol  (INDERAL ) 10 MG tablet Take 1 tablet (10 mg total) by mouth daily. 90 tablet 3   rosuvastatin  (CRESTOR ) 5 MG tablet Take 1 tablet (5 mg total) by mouth daily. 90 tablet 3   tiZANidine  (ZANAFLEX ) 2 MG tablet Take 1 tablet (2 mg total) by mouth 2 (two) times daily as needed for muscle spasms. 30 tablet 3   topiramate  (TOPAMAX ) 25 MG tablet Take 1 tablet (25 mg total) by mouth 3 (three) times daily as needed. 270 tablet 2   EPINEPHrine  0.3 mg/0.3 mL IJ SOAJ injection Inject 0.3 mg into the muscle as needed for anaphylaxis. (Patient not taking: Reported on 08/17/2024) 2 each 1   hydrocortisone  (ANUSOL -HC) 25 MG suppository Place 1 suppository (25 mg total) rectally 2 (two) times daily. (Patient not taking: Reported on 08/17/2024) 12 suppository 0   XANAX  0.25 MG tablet Take 0.25 mg by mouth as needed.     Current Facility-Administered Medications  Medication Dose Route Frequency Provider Last Rate Last Admin   0.9 %  sodium chloride  infusion  500 mL Intravenous Once Gizelle Whetsel E, MD       Facility-Administered Medications Ordered in Other Visits  Medication Dose Route Frequency Provider Last Rate Last Admin    sodium chloride  0.9 % bolus 250 mL  250 mL Intravenous Once Raulkar, Krutika P, MD        Allergies as of 08/17/2024 - Review Complete 08/17/2024  Allergen Reaction Noted   Tape Rash 12/12/2020    Family History  Problem Relation Age of Onset   Colon polyps Mother    Pancreatic cancer Mother        had whipple surgery 09/2023   Familial polyposis Mother    Hypertension Father    Stomach cancer Maternal Aunt    Pancreatic cancer Maternal Aunt    Pancreatic cancer Maternal Aunt    Colon cancer Maternal Grandmother        in her 53's  Colon polyps Paternal Grandfather        precancerous polyps   Prostate cancer Paternal Grandfather    Skin cancer Paternal Grandfather    Rectal cancer Neg Hx    Esophageal cancer Neg Hx     Social History   Socioeconomic History   Marital status: Significant Other    Spouse name: Not on file   Number of children: 3   Years of education: Not on file   Highest education level: Associate degree: occupational, scientist, product/process development, or vocational program  Occupational History   Occupation: Vet tech  Tobacco Use   Smoking status: Former    Current packs/day: 0.00    Average packs/day: 0.3 packs/day    Types: Cigarettes    Quit date: 10/12/2020    Years since quitting: 3.8   Smokeless tobacco: Never   Tobacco comments:    off and on   Vaping Use   Vaping status: Never Used  Substance and Sexual Activity   Alcohol use: Not Currently   Drug use: Never   Sexual activity: Not on file  Other Topics Concern   Not on file  Social History Narrative   Not on file   Social Drivers of Health   Tobacco Use: Medium Risk (08/17/2024)   Patient History    Smoking Tobacco Use: Former    Smokeless Tobacco Use: Never    Passive Exposure: Not on file  Financial Resource Strain: Low Risk (06/05/2024)   Overall Financial Resource Strain (CARDIA)    Difficulty of Paying Living Expenses: Not very hard  Food Insecurity: No Food Insecurity (06/05/2024)   Epic     Worried About Radiation Protection Practitioner of Food in the Last Year: Never true    Ran Out of Food in the Last Year: Never true  Transportation Needs: No Transportation Needs (06/05/2024)   Epic    Lack of Transportation (Medical): No    Lack of Transportation (Non-Medical): No  Physical Activity: Inactive (06/05/2024)   Exercise Vital Sign    Days of Exercise per Week: 0 days    Minutes of Exercise per Session: Not on file  Stress: Stress Concern Present (06/05/2024)   [employer] of Occupational Health - Occupational Stress Questionnaire    Feeling of Stress: Very much  Social Connections: Socially Isolated (06/05/2024)   Social Connection and Isolation Panel    Frequency of Communication with Friends and Family: Never    Frequency of Social Gatherings with Friends and Family: Never    Attends Religious Services: Never    Database Administrator or Organizations: No    Attends Engineer, Structural: Not on file    Marital Status: Divorced  Intimate Partner Violence: Not At Risk (07/23/2023)   Humiliation, Afraid, Rape, and Kick questionnaire    Fear of Current or Ex-Partner: No    Emotionally Abused: No    Physically Abused: No    Sexually Abused: No  Depression (PHQ2-9): Medium Risk (07/25/2024)   Depression (PHQ2-9)    PHQ-2 Score: 9  Alcohol Screen: Low Risk (07/23/2023)   Alcohol Screen    Last Alcohol Screening Score (AUDIT): 0  Housing: Low Risk (06/05/2024)   Epic    Unable to Pay for Housing in the Last Year: No    Number of Times Moved in the Last Year: 0    Homeless in the Last Year: No  Utilities: Not At Risk (07/23/2023)   [employer] Utilities    Threatened with loss of utilities: No  Health Literacy:  Adequate Health Literacy (07/23/2023)   B1300 Health Literacy    Frequency of need for help with medical instructions: Never    Review of Systems:  All other review of systems negative except as mentioned in the HPI.  Physical Exam: Vital signs BP 123/86   Pulse 83   Temp  (!) 97 F (36.1 C) (Temporal)   Ht 5' 5 (1.651 m)   Wt 171 lb (77.6 kg)   LMP 07/19/2022 Comment: Had baby in September  SpO2 99%   BMI 28.46 kg/m   General:   Alert,  Well-developed, well-nourished, pleasant and cooperative in NAD Airway:  Mallampati 1 Lungs:  Clear throughout to auscultation.   Heart:  Regular rate and rhythm; no murmurs, clicks, rubs,  or gallops. Abdomen:  Soft, nontender and nondistended. Normal bowel sounds.   Neuro/Psych:  Normal mood and affect. A and O x 3   Cobey Raineri E. Stacia, MD [facility]

## 2024-08-18 ENCOUNTER — Telehealth: Payer: Self-pay

## 2024-08-18 NOTE — Telephone Encounter (Signed)
 No answer, left message to call if having any issues or concerns, B.Vester Titsworth RN

## 2024-08-23 ENCOUNTER — Ambulatory Visit: Admitting: Gastroenterology

## 2024-08-24 ENCOUNTER — Ambulatory Visit (HOSPITAL_BASED_OUTPATIENT_CLINIC_OR_DEPARTMENT_OTHER): Admitting: Physical Therapy

## 2024-08-26 ENCOUNTER — Inpatient Hospital Stay

## 2024-08-31 ENCOUNTER — Ambulatory Visit (HOSPITAL_BASED_OUTPATIENT_CLINIC_OR_DEPARTMENT_OTHER): Admitting: Physical Therapy

## 2024-09-01 ENCOUNTER — Encounter: Admitting: Physical Medicine and Rehabilitation

## 2024-09-06 ENCOUNTER — Ambulatory Visit: Admitting: Cardiology

## 2024-09-07 ENCOUNTER — Ambulatory Visit (HOSPITAL_BASED_OUTPATIENT_CLINIC_OR_DEPARTMENT_OTHER): Admitting: Physical Therapy

## 2024-09-13 ENCOUNTER — Ambulatory Visit: Admitting: Cardiology

## 2024-09-14 ENCOUNTER — Ambulatory Visit (HOSPITAL_BASED_OUTPATIENT_CLINIC_OR_DEPARTMENT_OTHER): Admitting: Physical Therapy

## 2024-09-21 ENCOUNTER — Ambulatory Visit (HOSPITAL_BASED_OUTPATIENT_CLINIC_OR_DEPARTMENT_OTHER): Admitting: Physical Therapy

## 2024-09-28 ENCOUNTER — Ambulatory Visit (HOSPITAL_BASED_OUTPATIENT_CLINIC_OR_DEPARTMENT_OTHER): Admitting: Physical Therapy

## 2024-10-03 ENCOUNTER — Ambulatory Visit: Admitting: Physical Therapy

## 2024-12-26 ENCOUNTER — Ambulatory Visit: Payer: Self-pay | Admitting: Internal Medicine
